# Patient Record
Sex: Female | Born: 1965 | Race: Black or African American | Hispanic: No | Smoking: Never smoker
Health system: Southern US, Community
[De-identification: ages and names within clinical notes are randomized; demographics above are authoritative.]

## PROBLEM LIST (undated history)

## (undated) DIAGNOSIS — F32A Depression, unspecified: Secondary | ICD-10-CM

## (undated) DIAGNOSIS — D219 Benign neoplasm of connective and other soft tissue, unspecified: Secondary | ICD-10-CM

## (undated) DIAGNOSIS — K682 Retroperitoneal fibrosis: Secondary | ICD-10-CM

## (undated) DIAGNOSIS — K219 Gastro-esophageal reflux disease without esophagitis: Secondary | ICD-10-CM

## (undated) DIAGNOSIS — D649 Anemia, unspecified: Secondary | ICD-10-CM

## (undated) DIAGNOSIS — E669 Obesity, unspecified: Secondary | ICD-10-CM

## (undated) DIAGNOSIS — N83209 Unspecified ovarian cyst, unspecified side: Secondary | ICD-10-CM

## (undated) DIAGNOSIS — N135 Crossing vessel and stricture of ureter without hydronephrosis: Secondary | ICD-10-CM

## (undated) DIAGNOSIS — R011 Cardiac murmur, unspecified: Secondary | ICD-10-CM

## (undated) DIAGNOSIS — H269 Unspecified cataract: Secondary | ICD-10-CM

## (undated) DIAGNOSIS — M1990 Unspecified osteoarthritis, unspecified site: Secondary | ICD-10-CM

## (undated) DIAGNOSIS — G4730 Sleep apnea, unspecified: Secondary | ICD-10-CM

## (undated) DIAGNOSIS — T7840XA Allergy, unspecified, initial encounter: Secondary | ICD-10-CM

## (undated) DIAGNOSIS — F419 Anxiety disorder, unspecified: Secondary | ICD-10-CM

## (undated) HISTORY — DX: Anxiety disorder, unspecified: F41.9

## (undated) HISTORY — DX: Anemia, unspecified: D64.9

## (undated) HISTORY — DX: Unspecified cataract: H26.9

## (undated) HISTORY — DX: Allergy, unspecified, initial encounter: T78.40XA

## (undated) HISTORY — PX: ABDOMINAL HYSTERECTOMY: SHX81

## (undated) HISTORY — PX: ABDOMINAL SURGERY: SHX537

## (undated) HISTORY — DX: Depression, unspecified: F32.A

## (undated) HISTORY — DX: Obesity, unspecified: E66.9

## (undated) HISTORY — DX: Gastro-esophageal reflux disease without esophagitis: K21.9

## (undated) HISTORY — DX: Sleep apnea, unspecified: G47.30

---

## 1999-12-27 ENCOUNTER — Emergency Department (HOSPITAL_COMMUNITY): Admission: EM | Admit: 1999-12-27 | Discharge: 1999-12-28 | Payer: Self-pay

## 2001-03-24 ENCOUNTER — Emergency Department (HOSPITAL_COMMUNITY): Admission: EM | Admit: 2001-03-24 | Discharge: 2001-03-24 | Payer: Self-pay | Admitting: Emergency Medicine

## 2001-11-10 ENCOUNTER — Emergency Department (HOSPITAL_COMMUNITY): Admission: EM | Admit: 2001-11-10 | Discharge: 2001-11-10 | Payer: Self-pay | Admitting: Emergency Medicine

## 2001-11-10 ENCOUNTER — Observation Stay (HOSPITAL_COMMUNITY): Admission: AD | Admit: 2001-11-10 | Discharge: 2001-11-11 | Payer: Self-pay | Admitting: Family Medicine

## 2002-10-06 ENCOUNTER — Emergency Department (HOSPITAL_COMMUNITY): Admission: EM | Admit: 2002-10-06 | Discharge: 2002-10-06 | Payer: Self-pay | Admitting: Emergency Medicine

## 2003-08-11 ENCOUNTER — Emergency Department (HOSPITAL_COMMUNITY): Admission: EM | Admit: 2003-08-11 | Discharge: 2003-08-11 | Payer: Self-pay | Admitting: Emergency Medicine

## 2003-12-11 ENCOUNTER — Emergency Department (HOSPITAL_COMMUNITY): Admission: EM | Admit: 2003-12-11 | Discharge: 2003-12-11 | Payer: Self-pay | Admitting: *Deleted

## 2004-08-11 ENCOUNTER — Emergency Department (HOSPITAL_COMMUNITY): Admission: EM | Admit: 2004-08-11 | Discharge: 2004-08-11 | Payer: Self-pay | Admitting: Emergency Medicine

## 2004-12-07 ENCOUNTER — Ambulatory Visit (HOSPITAL_COMMUNITY): Admission: RE | Admit: 2004-12-07 | Discharge: 2004-12-07 | Payer: Self-pay | Admitting: Obstetrics

## 2005-12-15 ENCOUNTER — Emergency Department (HOSPITAL_COMMUNITY): Admission: EM | Admit: 2005-12-15 | Discharge: 2005-12-15 | Payer: Self-pay | Admitting: Emergency Medicine

## 2010-09-23 ENCOUNTER — Emergency Department (HOSPITAL_COMMUNITY)
Admission: EM | Admit: 2010-09-23 | Discharge: 2010-09-23 | Disposition: A | Discharge status: Home / Self Care | Payer: Self-pay | Attending: Emergency Medicine | Admitting: Emergency Medicine

## 2010-09-23 DIAGNOSIS — K589 Irritable bowel syndrome without diarrhea: Secondary | ICD-10-CM | POA: Insufficient documentation

## 2010-09-23 DIAGNOSIS — K921 Melena: Secondary | ICD-10-CM | POA: Insufficient documentation

## 2010-09-23 DIAGNOSIS — E669 Obesity, unspecified: Secondary | ICD-10-CM | POA: Insufficient documentation

## 2010-09-23 DIAGNOSIS — Z7982 Long term (current) use of aspirin: Secondary | ICD-10-CM | POA: Insufficient documentation

## 2010-09-23 DIAGNOSIS — K922 Gastrointestinal hemorrhage, unspecified: Secondary | ICD-10-CM | POA: Insufficient documentation

## 2010-09-23 LAB — CBC
HCT: 32.7 % — ABNORMAL LOW (ref 36.0–46.0)
Hemoglobin: 10.7 g/dL — ABNORMAL LOW (ref 12.0–15.0)
MCH: 26.8 pg (ref 26.0–34.0)
MCHC: 32.7 g/dL (ref 30.0–36.0)
MCV: 82 fL (ref 78.0–100.0)
Platelets: 281 10*3/uL (ref 150–400)
RBC: 3.99 MIL/uL (ref 3.87–5.11)
RDW: 14.1 % (ref 11.5–15.5)
WBC: 4.7 10*3/uL (ref 4.0–10.5)

## 2010-09-23 LAB — POCT I-STAT, CHEM 8
BUN: 11 mg/dL (ref 6–23)
Calcium, Ion: 1.4 mmol/L — ABNORMAL HIGH (ref 1.12–1.32)
Chloride: 108 mEq/L (ref 96–112)
Creatinine, Ser: 1 mg/dL (ref 0.50–1.10)
Glucose, Bld: 97 mg/dL (ref 70–99)
HCT: 35 % — ABNORMAL LOW (ref 36.0–46.0)
Hemoglobin: 11.9 g/dL — ABNORMAL LOW (ref 12.0–15.0)
Potassium: 3.8 mEq/L (ref 3.5–5.1)
Sodium: 140 mEq/L (ref 135–145)
TCO2: 23 mmol/L (ref 0–100)

## 2010-09-23 LAB — TYPE AND SCREEN
ABO/RH(D): A POS
Antibody Screen: NEGATIVE

## 2010-09-23 LAB — ABO/RH: ABO/RH(D): A POS

## 2010-09-24 LAB — OCCULT BLOOD, POC DEVICE: Fecal Occult Bld: NEGATIVE

## 2010-09-26 ENCOUNTER — Encounter: Payer: Self-pay | Admitting: Internal Medicine

## 2010-09-30 ENCOUNTER — Emergency Department (HOSPITAL_COMMUNITY)
Admission: EM | Admit: 2010-09-30 | Discharge: 2010-09-30 | Disposition: A | Discharge status: Home / Self Care | Payer: Self-pay | Attending: Emergency Medicine | Admitting: Emergency Medicine

## 2010-09-30 DIAGNOSIS — D649 Anemia, unspecified: Secondary | ICD-10-CM | POA: Insufficient documentation

## 2010-09-30 DIAGNOSIS — M549 Dorsalgia, unspecified: Secondary | ICD-10-CM | POA: Insufficient documentation

## 2010-09-30 DIAGNOSIS — L989 Disorder of the skin and subcutaneous tissue, unspecified: Secondary | ICD-10-CM | POA: Insufficient documentation

## 2010-09-30 LAB — URINALYSIS, ROUTINE W REFLEX MICROSCOPIC
Bilirubin Urine: NEGATIVE
Glucose, UA: NEGATIVE mg/dL
Ketones, ur: NEGATIVE mg/dL
Leukocytes, UA: NEGATIVE
Nitrite: NEGATIVE
Protein, ur: 30 mg/dL — AB
Specific Gravity, Urine: 1.008 (ref 1.005–1.030)
Urobilinogen, UA: 0.2 mg/dL (ref 0.0–1.0)
pH: 7 (ref 5.0–8.0)

## 2010-09-30 LAB — URINE MICROSCOPIC-ADD ON

## 2010-10-07 ENCOUNTER — Emergency Department (HOSPITAL_COMMUNITY)
Admission: EM | Admit: 2010-10-07 | Discharge: 2010-10-07 | Disposition: A | Discharge status: Home / Self Care | Payer: Self-pay | Attending: Emergency Medicine | Admitting: Emergency Medicine

## 2010-10-07 DIAGNOSIS — G8929 Other chronic pain: Secondary | ICD-10-CM | POA: Insufficient documentation

## 2010-10-07 DIAGNOSIS — N898 Other specified noninflammatory disorders of vagina: Secondary | ICD-10-CM | POA: Insufficient documentation

## 2010-10-07 DIAGNOSIS — R51 Headache: Secondary | ICD-10-CM | POA: Insufficient documentation

## 2010-10-07 DIAGNOSIS — R110 Nausea: Secondary | ICD-10-CM | POA: Insufficient documentation

## 2010-10-07 DIAGNOSIS — R109 Unspecified abdominal pain: Secondary | ICD-10-CM | POA: Insufficient documentation

## 2010-10-10 ENCOUNTER — Inpatient Hospital Stay (INDEPENDENT_AMBULATORY_CARE_PROVIDER_SITE_OTHER)
Admission: RE | Admit: 2010-10-10 | Discharge: 2010-10-10 | Disposition: A | Discharge status: Home / Self Care | Payer: Self-pay | Source: Ambulatory Visit | Attending: Emergency Medicine | Admitting: Emergency Medicine

## 2010-10-10 DIAGNOSIS — R6889 Other general symptoms and signs: Secondary | ICD-10-CM

## 2010-10-18 ENCOUNTER — Ambulatory Visit (INDEPENDENT_AMBULATORY_CARE_PROVIDER_SITE_OTHER): Payer: Self-pay | Admitting: Internal Medicine

## 2010-10-18 ENCOUNTER — Encounter: Payer: Self-pay | Admitting: Internal Medicine

## 2010-10-18 DIAGNOSIS — D179 Benign lipomatous neoplasm, unspecified: Secondary | ICD-10-CM

## 2010-10-18 DIAGNOSIS — K219 Gastro-esophageal reflux disease without esophagitis: Secondary | ICD-10-CM

## 2010-10-18 MED ORDER — OMEPRAZOLE 20 MG PO CPDR: 20.0000 mg | DELAYED_RELEASE_CAPSULE | Freq: Every day | ORAL | Status: DC

## 2010-10-18 NOTE — Progress Notes (Signed)
Subjective:    Patient ID: Theresa Peterson, female    DOB: 12/24/1965, 45 y.o.   MRN: [id number]  HPI Theresa Peterson is a 45 year old African American female with a past medical history of GERD, obesity, anxiety, presenting for evaluation of recently recurring heartburn with nausea and for violation of a chronic left upper quadrant "lump".  Theresa Peterson reports that Theresa Peterson's had intermittent trouble with heartburn (several isolated episodes lasting days to a few weeks over the past few years) and recently over the past one to 2 months has had an "amplification of symptoms". Theresa Peterson GERD symptoms primarily persist of heartburn, water brash, epigastric and chest pressure, nausea and bloating. Theresa Peterson also reports at times Theresa Peterson feels headache and dizziness, which Theresa Peterson associates with Theresa Peterson GERD. Theresa Peterson reports symptoms are worse when Theresa Peterson eats a heavy meal. Theresa Peterson started omeprazole 20 mg approximately one week ago and has had excellent relief of all of these symptoms. Theresa Peterson does report some back pain which Theresa Peterson also believes is related to Theresa Peterson reflux. This is some better with the omeprazole but not completely. Theresa Peterson reports that this is located in Theresa Peterson left upper back and Theresa Peterson describes this as a "knot" the lying down seems to make this pain better and eating worsens the pain. Theresa Peterson occasionally feels a "lump in my throat" but denies dysphagia and odynophagia. Theresa Peterson weight has been stable and Theresa Peterson appetite is good. Theresa Peterson reports no trouble with bowel movements. Theresa Peterson stools are regular, formed and brown. Theresa Peterson's had no rectal bleeding or melena.  No fevers chills or night sweats.  Of note, Theresa Peterson reports a left upper quadrant lump which Theresa Peterson initially noticed in 2005. Theresa Peterson reports Theresa Peterson had this evaluated and was told it may be a "hiatal hernia". Theresa Peterson does not recall any imaging be done for this. This lump has been stable in size and has not changed recently.  Also of note, the patient reports Theresa Peterson had colonoscopy in 2011. This was done for left lower quadrant  abdominal pain. Theresa Peterson reports that the exam was positive for internal hemorrhoids but was otherwise unremarkable (referred by PCP in [place], Dr. Lynwood Dawley)   Review of Systems  Constitutional: Negative for fever, chills, activity change, appetite change and unexpected weight change.  HENT: Negative for congestion, sore throat, mouth sores and trouble swallowing.   Eyes: Negative for visual disturbance.  Respiratory: Negative for cough, chest tightness and shortness of breath.   Cardiovascular: Negative for chest pain, palpitations and leg swelling.  Gastrointestinal:       See HPI   Genitourinary: Negative for dysuria and hematuria.  Musculoskeletal: Positive for back pain. Negative for myalgias and arthralgias.  Skin: Negative for rash.  Neurological: Positive for dizziness and headaches. Negative for weakness and numbness.  Hematological: Does not bruise/bleed easily.  Psychiatric/Behavioral: Negative for dysphoric mood. The patient is not nervous/anxious.    Family History  Problem Relation Age of Onset  . Colon cancer Maternal Uncle   . Breast cancer Maternal Aunt   . Pancreatic cancer Maternal Grandmother   . Diabetes Maternal Uncle   . Heart disease Mother     Social History  . Marital Status: Single    Number of Children: 1 - 19 yo adult daughter, lives in [place]   Occupational History  . 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 Smoking status: Never Smoker   . Smokeless tobacco: Never Used  . Alcohol Use: Yes  2 a month   . Drug Use: No   Social History Narrative   2 caffeine drinks a week        Objective:   Physical Exam  Constitutional: Theresa Peterson is oriented to person, place, and time. Theresa Peterson appears well-developed and well-nourished. No distress.  HENT:  Head: Normocephalic and atraumatic.  Mouth/Throat: Oropharynx is clear and moist. No oropharyngeal exudate.  Eyes: Conjunctivae and EOM are normal. Pupils are equal,  round, and reactive to light. No scleral icterus.  Neck: Neck supple. No tracheal deviation present. No thyromegaly present.  Cardiovascular: Normal rate, regular rhythm and intact distal pulses.   Murmur heard. Pulmonary/Chest: Effort normal and breath sounds normal. Theresa Peterson has no wheezes. Theresa Peterson has no rales.  Abdominal: Soft. Bowel sounds are normal. Theresa Peterson exhibits no distension. There is no tenderness.       Palpable, soft, small, moveable subcutaneous mass in LUQ just inf to costal margin, query lipoma   Musculoskeletal: Theresa Peterson exhibits no edema and no tenderness.  Lymphadenopathy:    Theresa Peterson has no cervical adenopathy.  Neurological: Theresa Peterson is alert and oriented to person, place, and time.  Skin: Skin is warm and dry. No rash noted.  Psychiatric: Theresa Peterson has a normal mood and affect. Theresa Peterson behavior is normal.   BP 128/64  Pulse 80  Ht 5\' 4"  (1.626 m)  Wt 231 lb (104.781 kg)  BMI 39.65 kg/m2    Assessment & Plan:  This is a 45 year old female with a past medical history of GERD who presents with a recent exacerbation of Theresa Peterson reflux disease now very well controlled on omeprazole 20 mg daily. Theresa Peterson also is here to discuss a left upper quadrant "lump", which is most likely a lipoma.  1. GERD - the patient's reflux symptoms are very well controlled on once daily PPI. There are no other alarm symptoms present at this time, therefore I do not think that Theresa Peterson needs an endoscopy. Should Theresa Peterson symptoms change or alarm symptoms occur then we can perform an EGD at that time. We have discussed food and lifestyle modifications which can help reduce reflux. Theresa Peterson does eat a lot of peppermint and I have advised Theresa Peterson that this can lead to worsening reflux symptoms.  At this time Theresa Peterson back pain is most consistent with musculaoskeletal pain and not felt to be related to Theresa Peterson reflux disease.  For now I have advised that Theresa Peterson should continue omeprazole 20 mg daily, and I will see Theresa Peterson back in the office in 2 months for reassessment.  2. ?  Lipoma - the patient's left upper quadrant "lump" is most likely a lipoma. Theresa Peterson is seeking reassurance and therefore I will order a left upper quadrant ultrasound to better evaluate.

## 2010-10-18 NOTE — Patient Instructions (Addendum)
Prilosec samples given, take 1 tab daily 20-30 minutes before breakfast. You have been scheduled for a [place] at [facility] 10/22/10 815 am arrival time.  Nothing to eat or drink after midnight. Please follow up in 2-3 months

## 2010-10-22 ENCOUNTER — Ambulatory Visit (HOSPITAL_COMMUNITY)
Admission: RE | Admit: 2010-10-22 | Discharge: 2010-10-22 | Disposition: A | Discharge status: Home / Self Care | Payer: Self-pay | Source: Ambulatory Visit | Attending: Internal Medicine | Admitting: Internal Medicine

## 2010-10-22 ENCOUNTER — Other Ambulatory Visit: Payer: Self-pay | Admitting: Internal Medicine

## 2010-10-22 ENCOUNTER — Telehealth: Payer: Self-pay | Admitting: *Deleted

## 2010-10-22 DIAGNOSIS — K219 Gastro-esophageal reflux disease without esophagitis: Secondary | ICD-10-CM | POA: Insufficient documentation

## 2010-10-22 DIAGNOSIS — D1739 Benign lipomatous neoplasm of skin and subcutaneous tissue of other sites: Secondary | ICD-10-CM | POA: Insufficient documentation

## 2010-10-22 NOTE — Telephone Encounter (Signed)
Informed pt per Dr Rhea Belton, U/S shows that the small SQ mass is a Lipoma- benign- that needs no further f/u. She is scheduled for f/u on 12/17/10 at 1100am.

## 2010-10-22 NOTE — Telephone Encounter (Signed)
Message copied by Florene Glen on Mon Oct 22, 2010  1:26 PM ------      Message from: Beverley Fiedler      Created: Mon Oct 22, 2010 12:37 PM       Small subcutaneous mass felt on recent exam now known to be lipoma by [place].      Aram Beecham, please call patient and inform her of this result (benign lipoma which does not need further follow-up/evaluation).      I will followup with her in clinic as documented in last clinic note.      Thanks.

## 2010-10-26 ENCOUNTER — Encounter (HOSPITAL_COMMUNITY): Payer: Self-pay | Admitting: Radiology

## 2010-10-26 ENCOUNTER — Inpatient Hospital Stay (INDEPENDENT_AMBULATORY_CARE_PROVIDER_SITE_OTHER)
Admission: RE | Admit: 2010-10-26 | Discharge: 2010-10-26 | Disposition: A | Discharge status: Home / Self Care | Payer: Self-pay | Source: Ambulatory Visit | Attending: Family Medicine | Admitting: Family Medicine

## 2010-10-26 ENCOUNTER — Emergency Department (HOSPITAL_COMMUNITY): Payer: Self-pay

## 2010-10-26 ENCOUNTER — Emergency Department (HOSPITAL_COMMUNITY)
Admission: EM | Admit: 2010-10-26 | Discharge: 2010-10-26 | Disposition: A | Discharge status: Home / Self Care | Payer: Self-pay | Attending: Emergency Medicine | Admitting: Emergency Medicine

## 2010-10-26 DIAGNOSIS — R079 Chest pain, unspecified: Secondary | ICD-10-CM

## 2010-10-26 DIAGNOSIS — R0602 Shortness of breath: Secondary | ICD-10-CM | POA: Insufficient documentation

## 2010-10-26 DIAGNOSIS — R072 Precordial pain: Secondary | ICD-10-CM | POA: Insufficient documentation

## 2010-10-26 DIAGNOSIS — R011 Cardiac murmur, unspecified: Secondary | ICD-10-CM | POA: Insufficient documentation

## 2010-10-26 DIAGNOSIS — K219 Gastro-esophageal reflux disease without esophagitis: Secondary | ICD-10-CM | POA: Insufficient documentation

## 2010-10-26 DIAGNOSIS — R109 Unspecified abdominal pain: Secondary | ICD-10-CM | POA: Insufficient documentation

## 2010-10-26 HISTORY — DX: Benign neoplasm of connective and other soft tissue, unspecified: D21.9

## 2010-10-26 HISTORY — DX: Cardiac murmur, unspecified: R01.1

## 2010-10-26 LAB — DIFFERENTIAL
Basophils Absolute: 0 10*3/uL (ref 0.0–0.1)
Basophils Relative: 0 % (ref 0–1)
Eosinophils Absolute: 0.1 10*3/uL (ref 0.0–0.7)
Eosinophils Relative: 2 % (ref 0–5)
Lymphocytes Relative: 28 % (ref 12–46)
Lymphs Abs: 1.3 10*3/uL (ref 0.7–4.0)
Monocytes Absolute: 0.2 10*3/uL (ref 0.1–1.0)
Monocytes Relative: 4 % (ref 3–12)
Neutro Abs: 3.1 10*3/uL (ref 1.7–7.7)
Neutrophils Relative %: 66 % (ref 43–77)

## 2010-10-26 LAB — URINE MICROSCOPIC-ADD ON

## 2010-10-26 LAB — CBC
HCT: 28.3 % — ABNORMAL LOW (ref 36.0–46.0)
Hemoglobin: 9.2 g/dL — ABNORMAL LOW (ref 12.0–15.0)
MCH: 27.2 pg (ref 26.0–34.0)
MCHC: 32.5 g/dL (ref 30.0–36.0)
MCV: 83.7 fL (ref 78.0–100.0)
Platelets: 248 10*3/uL (ref 150–400)
RBC: 3.38 MIL/uL — ABNORMAL LOW (ref 3.87–5.11)
RDW: 15.1 % (ref 11.5–15.5)
WBC: 4.7 10*3/uL (ref 4.0–10.5)

## 2010-10-26 LAB — COMPREHENSIVE METABOLIC PANEL
ALT: 9 U/L (ref 0–35)
AST: 15 U/L (ref 0–37)
Albumin: 3.4 g/dL — ABNORMAL LOW (ref 3.5–5.2)
Alkaline Phosphatase: 56 U/L (ref 39–117)
BUN: 10 mg/dL (ref 6–23)
CO2: 29 mEq/L (ref 19–32)
Calcium: 10.6 mg/dL — ABNORMAL HIGH (ref 8.4–10.5)
Chloride: 106 mEq/L (ref 96–112)
Creatinine, Ser: 0.82 mg/dL (ref 0.50–1.10)
GFR calc Af Amer: >60 mL/min (ref >60)
GFR calc non Af Amer: >60 mL/min (ref >60)
Glucose, Bld: 92 mg/dL (ref 70–99)
Potassium: 4.4 mEq/L (ref 3.5–5.1)
Sodium: 138 mEq/L (ref 135–145)
Total Bilirubin: 0.2 mg/dL — ABNORMAL LOW (ref 0.3–1.2)
Total Protein: 6.7 g/dL (ref 6.0–8.3)

## 2010-10-26 LAB — URINALYSIS, ROUTINE W REFLEX MICROSCOPIC
Bilirubin Urine: NEGATIVE
Glucose, UA: NEGATIVE mg/dL
Ketones, ur: NEGATIVE mg/dL
Leukocytes, UA: NEGATIVE
Nitrite: NEGATIVE
Protein, ur: NEGATIVE mg/dL
Specific Gravity, Urine: 1.013 (ref 1.005–1.030)
Urobilinogen, UA: 0.2 mg/dL (ref 0.0–1.0)
pH: 7 (ref 5.0–8.0)

## 2010-10-26 LAB — POCT PREGNANCY, URINE: Preg Test, Ur: NEGATIVE

## 2010-10-26 LAB — POCT I-STAT TROPONIN I: Troponin i, poc: 0 ng/mL (ref 0.00–0.08)

## 2010-10-26 LAB — D-DIMER, QUANTITATIVE: D-Dimer, Quant: 1.51 ug/mL-FEU — ABNORMAL HIGH (ref 0.00–0.48)

## 2010-10-26 LAB — LIPASE, BLOOD: Lipase: 45 U/L (ref 11–59)

## 2010-10-26 MED ORDER — IOHEXOL 300 MG/ML  SOLN
100.0000 mL | Freq: Once | INTRAMUSCULAR | Status: AC | PRN
  Administered 2010-10-26: 100 mL via INTRAVENOUS

## 2010-10-29 ENCOUNTER — Emergency Department (HOSPITAL_COMMUNITY)
Admission: EM | Admit: 2010-10-29 | Discharge: 2010-10-29 | Disposition: A | Discharge status: Home / Self Care | Payer: Self-pay | Attending: Emergency Medicine | Admitting: Emergency Medicine

## 2010-10-29 DIAGNOSIS — K219 Gastro-esophageal reflux disease without esophagitis: Secondary | ICD-10-CM | POA: Insufficient documentation

## 2010-10-29 DIAGNOSIS — D179 Benign lipomatous neoplasm, unspecified: Secondary | ICD-10-CM | POA: Insufficient documentation

## 2010-10-29 DIAGNOSIS — R10816 Epigastric abdominal tenderness: Secondary | ICD-10-CM | POA: Insufficient documentation

## 2010-10-29 DIAGNOSIS — K2970 Gastritis, unspecified, without bleeding: Secondary | ICD-10-CM | POA: Insufficient documentation

## 2010-10-29 LAB — URINALYSIS, ROUTINE W REFLEX MICROSCOPIC
Bilirubin Urine: NEGATIVE
Glucose, UA: NEGATIVE mg/dL
Nitrite: NEGATIVE
Specific Gravity, Urine: 1.019 (ref 1.005–1.030)
pH: 6.5 (ref 5.0–8.0)

## 2010-10-29 LAB — URINE MICROSCOPIC-ADD ON

## 2010-10-29 LAB — POCT I-STAT, CHEM 8
BUN: 10 mg/dL (ref 6–23)
Chloride: 106 mEq/L (ref 96–112)
Creatinine, Ser: 1 mg/dL (ref 0.50–1.10)
Glucose, Bld: 97 mg/dL (ref 70–99)
HCT: 32 % — ABNORMAL LOW (ref 36.0–46.0)
Potassium: 4 mEq/L (ref 3.5–5.1)

## 2010-11-02 ENCOUNTER — Inpatient Hospital Stay (INDEPENDENT_AMBULATORY_CARE_PROVIDER_SITE_OTHER)
Admission: RE | Admit: 2010-11-02 | Discharge: 2010-11-02 | Disposition: A | Discharge status: Home / Self Care | Payer: Self-pay | Source: Ambulatory Visit | Attending: Family Medicine | Admitting: Family Medicine

## 2010-11-02 DIAGNOSIS — K219 Gastro-esophageal reflux disease without esophagitis: Secondary | ICD-10-CM

## 2010-11-19 ENCOUNTER — Ambulatory Visit: Payer: Self-pay | Admitting: Internal Medicine

## 2010-11-26 ENCOUNTER — Emergency Department (HOSPITAL_COMMUNITY): Payer: Self-pay

## 2010-11-26 ENCOUNTER — Emergency Department (HOSPITAL_COMMUNITY)
Admission: EM | Admit: 2010-11-26 | Discharge: 2010-11-26 | Disposition: A | Discharge status: Home / Self Care | Payer: Self-pay | Attending: Emergency Medicine | Admitting: Emergency Medicine

## 2010-11-26 DIAGNOSIS — K219 Gastro-esophageal reflux disease without esophagitis: Secondary | ICD-10-CM | POA: Insufficient documentation

## 2010-11-26 DIAGNOSIS — Z79899 Other long term (current) drug therapy: Secondary | ICD-10-CM | POA: Insufficient documentation

## 2010-11-26 DIAGNOSIS — R079 Chest pain, unspecified: Secondary | ICD-10-CM | POA: Insufficient documentation

## 2010-11-26 DIAGNOSIS — R002 Palpitations: Secondary | ICD-10-CM | POA: Insufficient documentation

## 2010-11-26 DIAGNOSIS — R000 Tachycardia, unspecified: Secondary | ICD-10-CM | POA: Insufficient documentation

## 2010-11-26 LAB — TROPONIN I: Troponin I: <0.3 ng/mL (ref <0.30)

## 2010-11-26 LAB — COMPREHENSIVE METABOLIC PANEL
ALT: 11 U/L (ref 0–35)
Albumin: 3.8 g/dL (ref 3.5–5.2)
Alkaline Phosphatase: 66 U/L (ref 39–117)
Potassium: 4.4 mEq/L (ref 3.5–5.1)
Sodium: 140 mEq/L (ref 135–145)
Total Protein: 7.3 g/dL (ref 6.0–8.3)

## 2010-11-26 LAB — CBC
Hemoglobin: 10.1 g/dL — ABNORMAL LOW (ref 12.0–15.0)
MCHC: 32.4 g/dL (ref 30.0–36.0)
RDW: 14.5 % (ref 11.5–15.5)

## 2010-11-26 LAB — DIFFERENTIAL
Basophils Absolute: 0 10*3/uL (ref 0.0–0.1)
Basophils Relative: 1 % (ref 0–1)
Eosinophils Relative: 2 % (ref 0–5)
Monocytes Absolute: 0.3 10*3/uL (ref 0.1–1.0)
Neutro Abs: 4.2 10*3/uL (ref 1.7–7.7)

## 2010-12-03 ENCOUNTER — Ambulatory Visit: Payer: Self-pay | Admitting: Internal Medicine

## 2010-12-07 ENCOUNTER — Encounter: Payer: Self-pay | Admitting: Cardiovascular Disease

## 2010-12-10 ENCOUNTER — Ambulatory Visit (INDEPENDENT_AMBULATORY_CARE_PROVIDER_SITE_OTHER): Payer: Self-pay | Admitting: Cardiovascular Disease

## 2010-12-10 ENCOUNTER — Encounter: Payer: Self-pay | Admitting: Cardiovascular Disease

## 2010-12-10 DIAGNOSIS — R002 Palpitations: Secondary | ICD-10-CM | POA: Insufficient documentation

## 2010-12-10 DIAGNOSIS — R079 Chest pain, unspecified: Secondary | ICD-10-CM | POA: Insufficient documentation

## 2010-12-10 NOTE — Patient Instructions (Signed)
Your physician has requested that you have an echocardiogram. Echocardiography is a painless test that uses sound waves to create images of your heart. It provides your doctor with information about the size and shape of your heart and how well your heart's chambers and valves are working. This procedure takes approximately one hour. There are no restrictions for this procedure.   Your physician has requested that you have an exercise tolerance test. For further information please visit [email or web address] Please also follow instruction sheet, as given.   Your physician has recommended that you wear a holter monitor. Holter monitors are medical devices that record the heart's electrical activity. Doctors most often use these monitors to diagnose arrhythmias. Arrhythmias are problems with the speed or rhythm of the heartbeat. The monitor is a small, portable device. You can wear one while you do your normal daily activities. This is usually used to diagnose what is causing palpitations/syncope (passing out).

## 2010-12-10 NOTE — Progress Notes (Signed)
History of Present Illness:44 yo AAF with history of anxiety/depression, anemia, GERD here today as a new cardiac patient. She was seen in the E D at [facility] 11/26/10 with c/o chest pain. She tells me that she has been having chest discomfort and palpitations since February 2012. She was started on Prilosec at that time. She continued to have discomfort in her chest and palpitations. Several days ago she felt her heart skip several beats. This happens once per week. She feels pressure in her chest every day. This is most notable in the early am when she wakes up. It seems to be worse with meals. The pain is never exertional. Occasional dizziness.   Past Medical History  Diagnosis Date  . Anemia   . Anxiety   . GERD (gastroesophageal reflux disease)   . Obesity   . Hemorrhoids   . Anemia   . Fibroids   . Heart murmur     No past surgical history on file.  Current Outpatient Prescriptions  Medication Sig Dispense Refill  . Ferrous Sulfate (IRON) 325 (65 FE) MG TABS Take 1 tablet by mouth daily.        . Multiple Vitamins-Minerals (MULTIVITAL) tablet Take 1 tablet by mouth daily.        Marland Kitchen omeprazole (PRILOSEC) 20 MG capsule Take 1 capsule (20 mg total) by mouth daily.  30 capsule  1    Allergies  Allergen Reactions  . Flagyl (Metronidazole Hcl)     History   Social History  . Marital Status: Divorced    Spouse Name: N/A    Number of Children: 1  . Years of Education: N/A   Occupational History  . Caregiver    Social History Main Topics  . Smoking status: Never Smoker   . Smokeless tobacco: Never Used  . Alcohol Use: Yes     2 a month   . Drug Use: No  . Sexually Active: Not on file   Other Topics Concern  . Not on file   Social History Narrative   2 caffeine drinks a week     Family History  Problem Relation Age of Onset  . Colon cancer Maternal Uncle   . Breast cancer Maternal Aunt   . Pancreatic cancer Maternal Grandmother   . Diabetes Maternal Uncle   . Heart  disease Mother     Review of Systems:  As stated in the HPI and otherwise negative.   BP 110/70  Pulse 76  Resp 18  Ht 5\' 4"  (1.626 m)  Wt 230 lb 1.9 oz (104.382 kg)  BMI 39.50 kg/m2  Physical Examination: General: Well developed, well nourished, NAD HEENT: OP clear, mucus membranes moist SKIN: warm, dry. No rashes. Neuro: No focal deficits Musculoskeletal: Muscle strength 5/5 all ext Psychiatric: Mood and affect normal Neck: No JVD, no carotid bruits, no thyromegaly, no lymphadenopathy. Lungs:Clear bilaterally, no wheezes, rhonci, crackles Cardiovascular: Regular rate and rhythm. No murmurs, gallops or rubs. Abdomen:Soft. Bowel sounds present. Non-tender.  Extremities: No lower extremity edema. Pulses are 2 + in the bilateral DP/PT.  EKG:NSR, rate 76 bpm. Normal EKG.

## 2010-12-10 NOTE — Assessment & Plan Note (Signed)
Likely premature beats. No prolonged episodes. Will arrange 48 hour Holter monitor. Will get echo to exclude structural heart disease.

## 2010-12-10 NOTE — Assessment & Plan Note (Signed)
Atypical. Will arrange exercise treadmill stress test.

## 2010-12-17 ENCOUNTER — Ambulatory Visit: Payer: Self-pay | Admitting: Internal Medicine

## 2010-12-24 ENCOUNTER — Encounter (INDEPENDENT_AMBULATORY_CARE_PROVIDER_SITE_OTHER): Payer: Self-pay

## 2010-12-24 DIAGNOSIS — R002 Palpitations: Secondary | ICD-10-CM

## 2010-12-26 ENCOUNTER — Ambulatory Visit (INDEPENDENT_AMBULATORY_CARE_PROVIDER_SITE_OTHER): Payer: Self-pay | Admitting: Cardiovascular Disease

## 2010-12-26 ENCOUNTER — Ambulatory Visit (HOSPITAL_COMMUNITY): Payer: Self-pay | Attending: Cardiology | Admitting: Radiology

## 2010-12-26 DIAGNOSIS — R002 Palpitations: Secondary | ICD-10-CM | POA: Insufficient documentation

## 2010-12-26 DIAGNOSIS — I319 Disease of pericardium, unspecified: Secondary | ICD-10-CM | POA: Insufficient documentation

## 2010-12-26 DIAGNOSIS — E669 Obesity, unspecified: Secondary | ICD-10-CM | POA: Insufficient documentation

## 2010-12-26 DIAGNOSIS — R079 Chest pain, unspecified: Secondary | ICD-10-CM

## 2010-12-26 DIAGNOSIS — R072 Precordial pain: Secondary | ICD-10-CM | POA: Insufficient documentation

## 2010-12-26 NOTE — Progress Notes (Signed)
Exercise Treadmill Test  Pre-Exercise Testing Evaluation Rhythm: normal sinus  Rate: 63   PR:  .18 QRS:  .08  QT:  .39 QTc: .40     Test  Exercise Tolerance Test Ordering MD: Melene Muller, MD  Interpreting MD:  Melene Muller, MD  Unique Test No: 1  Treadmill:  1  Indication for ETT: chest pain - rule out ischemia  Contraindication to ETT: No   Stress Modality: exercise - treadmill  Cardiac Imaging Performed: non   Protocol: standard Bruce - maximal  Max BP:  156/68  Max MPHR (bpm):  176 85% MPR (bpm):  149  MPHR obtained (bpm):  171 % MPHR obtained:  97%  Reached 85% MPHR (min:sec):  2:00 Total Exercise Time (min-sec):  5:02  Workload in METS:  7.0 Borg Scale: 17  Reason ETT Terminated:  fatigue    ST Segment Analysis At Rest: normal ST segments - no evidence of significant ST depression With Exercise: no evidence of significant ST depression  Other Information Arrhythmia:  No Angina during ETT:  absent (0) Quality of ETT:  non-diagnostic  ETT Interpretation:  normal - no evidence of ischemia by ST analysis  Comments:  Pt exercised for 5 minutes of the Bruce protocol and stopped secondary to fatigue. There were no ischemic EKG changes with exercise. No chest pain with exercise.   Recommendations: No further ischemic cardiac workup.

## 2010-12-29 ENCOUNTER — Inpatient Hospital Stay (INDEPENDENT_AMBULATORY_CARE_PROVIDER_SITE_OTHER)
Admission: RE | Admit: 2010-12-29 | Discharge: 2010-12-29 | Disposition: A | Discharge status: Home / Self Care | Payer: Self-pay | Source: Ambulatory Visit | Attending: Family Medicine | Admitting: Family Medicine

## 2010-12-29 DIAGNOSIS — H5710 Ocular pain, unspecified eye: Secondary | ICD-10-CM

## 2011-01-22 ENCOUNTER — Telehealth: Payer: Self-pay | Admitting: *Deleted

## 2011-01-22 NOTE — Telephone Encounter (Signed)
Called pt to review monitor results. Left message to call back

## 2011-01-22 NOTE — Telephone Encounter (Signed)
Fu call °Pt returning your call  °

## 2011-01-22 NOTE — Telephone Encounter (Signed)
Spoke with pt and reviewed monitor results with her.  

## 2011-01-31 ENCOUNTER — Encounter (HOSPITAL_COMMUNITY): Payer: Self-pay | Admitting: Adult Health

## 2011-01-31 ENCOUNTER — Emergency Department (HOSPITAL_COMMUNITY): Payer: Self-pay

## 2011-01-31 ENCOUNTER — Emergency Department (HOSPITAL_COMMUNITY)
Admission: EM | Admit: 2011-01-31 | Discharge: 2011-01-31 | Disposition: A | Discharge status: Home / Self Care | Payer: Self-pay | Attending: Emergency Medicine | Admitting: Emergency Medicine

## 2011-01-31 DIAGNOSIS — R51 Headache: Secondary | ICD-10-CM | POA: Insufficient documentation

## 2011-01-31 DIAGNOSIS — H5710 Ocular pain, unspecified eye: Secondary | ICD-10-CM | POA: Insufficient documentation

## 2011-01-31 DIAGNOSIS — M79602 Pain in left arm: Secondary | ICD-10-CM

## 2011-01-31 DIAGNOSIS — K219 Gastro-esophageal reflux disease without esophagitis: Secondary | ICD-10-CM | POA: Insufficient documentation

## 2011-01-31 DIAGNOSIS — E669 Obesity, unspecified: Secondary | ICD-10-CM | POA: Insufficient documentation

## 2011-01-31 DIAGNOSIS — M25529 Pain in unspecified elbow: Secondary | ICD-10-CM | POA: Insufficient documentation

## 2011-01-31 DIAGNOSIS — M79609 Pain in unspecified limb: Secondary | ICD-10-CM | POA: Insufficient documentation

## 2011-01-31 DIAGNOSIS — F411 Generalized anxiety disorder: Secondary | ICD-10-CM | POA: Insufficient documentation

## 2011-01-31 LAB — CBC
HCT: 36.7 % (ref 36.0–46.0)
Hemoglobin: 11.4 g/dL — ABNORMAL LOW (ref 12.0–15.0)
WBC: 5.1 10*3/uL (ref 4.0–10.5)

## 2011-01-31 LAB — DIFFERENTIAL
Basophils Absolute: 0 10*3/uL (ref 0.0–0.1)
Lymphocytes Relative: 27 % (ref 12–46)
Monocytes Absolute: 0.1 10*3/uL (ref 0.1–1.0)
Monocytes Relative: 2 % — ABNORMAL LOW (ref 3–12)
Neutro Abs: 3.4 10*3/uL (ref 1.7–7.7)

## 2011-01-31 LAB — BASIC METABOLIC PANEL
CO2: 27 mEq/L (ref 19–32)
Chloride: 102 mEq/L (ref 96–112)
Creatinine, Ser: 0.84 mg/dL (ref 0.50–1.10)

## 2011-01-31 MED ORDER — DICLOFENAC SODIUM 75 MG PO TBEC: 75.0000 mg | DELAYED_RELEASE_TABLET | Freq: Two times a day (BID) | ORAL | Status: DC

## 2011-01-31 NOTE — ED Notes (Signed)
C/o blurred vision in the right eye with a heavy sensation of the eyelid---No visible deformity---Also pain in the left elbow extending down to left hand and involving the thumb, 2nd and 3rd digits.  Rates the pain an 8 on 1-10 scale.

## 2011-01-31 NOTE — ED Provider Notes (Signed)
History     CSN: [id number] Arrival date & time: 01/31/2011  9:10 AM   First MD Initiated Contact with Patient 01/31/11 1008     10:41 AM HPI Patient complaining of left arm pain. Points to pain located in her left elbow and radiating down her entire forearm to her first second and third phalanges. Describes pain as aching pain. Reports pain is worse when lifting heavy objects. States she cannot lift her milk carton with her left hand. Reports will become weak and she'll drop it. States at times the pain radiates all the way up her left shoulder. Denies known injury. 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 States at first pain was intermittent but now is constant. Reports pain initially was helped by taking Advil. Patient also complaining of right eye pain. Describes the sensation as burning. States her eyelid also feels swollen and her vision is blurred. Reports similar symptoms in her left eye several months ago. States at that time she had seen her ophthalmologist because she thought her vision had changed. Denies she had any visual changes. States symptoms gradually disappeared. Current right eye pain has been ongoing for one week. States pain will relieve itself after taking Advil. Reports she became concerned when she began to have a headache on the right side behind her eye. Describes headache is mild and aching. Associated with nausea. Denies any associated symptoms such as a fascia, ataxia, neck pain, fever, rash. Patient is a 45 y.o. female presenting with eye problem, arm pain, and headaches. The history is provided by the patient.  Eye Problem  This is a new problem. The current episode started more than 1 week ago. Episode frequency: intermittently  The problem has not changed since onset.There is pain in the right eye. There was no injury mechanism. The pain is moderate. There is no history of trauma to the eye. There is no known exposure to pink eye. She  does not wear contacts. Associated symptoms include blurred vision and nausea. Pertinent negatives include no numbness, no discharge, no double vision, no photophobia, no eye redness, no vomiting, no tingling and no itching. She has tried nothing for the symptoms.  Arm Pain This is a new problem. Episode onset: a month. The problem occurs constantly. The problem has been gradually worsening. Associated symptoms include headaches and nausea. Pertinent negatives include no abdominal pain, chest pain, chills, congestion, coughing, fever, joint swelling, neck pain, numbness, rash, sore throat or vomiting. Exacerbated by: lifting heavy objects. She has tried NSAIDs for the symptoms. The treatment provided mild relief.  Headache  This is a new problem. The current episode started more than 1 week ago. Episode frequency: intermittently. The problem has not changed since onset.Associated with: eye pain. The pain is located in the frontal (Right frontal: behind eye) region. The quality of the pain is described as dull. The pain is mild. The pain does not radiate. Associated symptoms include nausea. Pertinent negatives include no fever, no near-syncope, no palpitations, no syncope, no shortness of breath and no vomiting. She has tried NSAIDs for the symptoms. The treatment provided mild relief.    Past Medical History  Diagnosis Date  . Anemia   . Anxiety   . GERD (gastroesophageal reflux disease)   . Obesity   . Hemorrhoids   . Anemia   . Fibroids   . Heart murmur     History reviewed. No pertinent past surgical history.  Family History  Problem Relation Age of Onset  . Colon cancer Maternal Uncle   . Breast cancer Maternal Aunt   . Heart disease Maternal Aunt   . Pancreatic cancer Maternal Grandmother   . Diabetes Maternal Uncle   . Hypertension Mother     History  Substance Use Topics  . Smoking status: Never Smoker   . Smokeless tobacco: Never Used  . Alcohol Use: Yes     2 a month      OB History    Grav Para Term Preterm Abortions TAB SAB Ect Mult Living                  Review of Systems  Constitutional: Negative for fever and chills.  HENT: Negative for ear pain, congestion, sore throat, neck pain and neck stiffness.   Eyes: Positive for blurred vision, pain and visual disturbance. Negative for double vision, photophobia, discharge, redness and itching.  Respiratory: Negative for cough and shortness of breath.   Cardiovascular: Negative for chest pain, palpitations, syncope and near-syncope.  Gastrointestinal: Positive for nausea. Negative for vomiting and abdominal pain.  Musculoskeletal: Negative for back pain and joint swelling.  Skin: Negative for itching and rash.  Neurological: Positive for headaches. Negative for dizziness, tingling, syncope, speech difficulty and numbness.  All other systems reviewed and are negative.    Allergies  Food and Flagyl  Home Medications   Current Outpatient Rx  Name Route Sig Dispense Refill  . ASPIRIN 325 MG PO TBEC Oral Take 650 mg by mouth every 4 (four) hours as needed. For arm pain     . IRON 325 (65 FE) MG PO TABS Oral Take 1 tablet by mouth daily.      . IBUPROFEN 200 MG PO TABS Oral Take 200 mg by mouth every 6 (six) hours as needed. For arm pain     . METRONIDAZOLE 250 MG PO TABS Oral Take 500 mg by mouth 3 (three) times daily. Patient only took a couple of tablets and started feeling sick so she stopped.     . MULTIVITAL PO TABS Oral Take 1 tablet by mouth daily.      Marland Kitchen OMEPRAZOLE 20 MG PO CPDR Oral Take 1 capsule (20 mg total) by mouth daily. 30 capsule 1    BP 127/83  Pulse 86  Temp(Src) 98.6 F (37 C) (Oral)  Resp 20  SpO2 100%  Physical Exam  Vitals reviewed. Constitutional: She is oriented to person, place, and time. Vital signs are normal. She appears well-developed and well-nourished.  HENT:  Head: Normocephalic and atraumatic.  Right Ear: Tympanic membrane, external ear and ear canal  normal.  Left Ear: Tympanic membrane, external ear and ear canal normal.  Eyes: Conjunctivae, EOM and lids are normal. Pupils are equal, round, and reactive to light.  Neck: Trachea normal and normal range of motion. Neck supple. No spinous process tenderness and no muscular tenderness present. No Brudzinski's sign and no Kernig's sign noted. No mass present.  Cardiovascular: Normal rate, regular rhythm and normal heart sounds.  Exam reveals no friction rub.   No murmur heard. Pulmonary/Chest: Effort normal and breath sounds normal. She has no wheezes. She has no rhonchi. She has no rales. She exhibits no tenderness.  Musculoskeletal: Normal range of motion.       Left neck: Full range of motion. Nontender to palpation or percussion.  Left shoulder: full range of motion. Normal strength. No obvious deformities. Nontender to palpation or percussion.  Left elbow: Full range  of motion. Normal strength. No obvious deformities. No tenderness to palpation. No reproducible symptoms with percussion.  Left wrist: Full range of motion. Normal strength. No obvious deformities. No tenderness to palpation. No reproducible symptoms with percussion.  Negative Tinel's and Phalen's test. Negative drop test.   Neurological: She is alert and oriented to person, place, and time. She has normal strength. No cranial nerve deficit or sensory deficit. Coordination normal. GCS eye subscore is 4. GCS verbal subscore is 5. GCS motor subscore is 6.  Skin: Skin is warm and dry. No rash noted. No erythema. No pallor.    ED Course  Procedures    MDM    2:33 PM  Labs and imaging within Normal limits. Vision is 20/13, corrected in both eyes and separately. Advised follow-up with her ophthalmologist and a PCP for further evaluation. Advised return to ed for worsening symptoms. Given referrals for PCP f/u. Rx for Diclofenac if pain persists     Thomasene Lot, [place] 01/31/11 1648

## 2011-01-31 NOTE — ED Notes (Signed)
Pt states she began having vision changes in both eyes c/o blurred vision and slight headache one week and a half ago, did see optometrist who stated everything was fine but did have a smaller right pupil than left. Today she says her right eye feels swollen and vision is still blurred. C/o left arm pain that began one month ago after chest pain that was severe, the left arm pain has gotten worse and pt staes she can not pick up a gallon of milk w/o severe pain.

## 2011-01-31 NOTE — ED Provider Notes (Signed)
Medical screening examination/treatment/procedure(s) were performed by non-physician practitioner and as supervising physician I was immediately available for consultation/collaboration.  Flint Melter, MD 01/31/11 2107

## 2011-02-08 ENCOUNTER — Emergency Department (HOSPITAL_COMMUNITY)
Admission: EM | Admit: 2011-02-08 | Discharge: 2011-02-08 | Disposition: A | Discharge status: Home / Self Care | Payer: Self-pay | Attending: Emergency Medicine | Admitting: Emergency Medicine

## 2011-02-08 ENCOUNTER — Encounter (HOSPITAL_COMMUNITY): Payer: Self-pay | Admitting: *Deleted

## 2011-02-08 DIAGNOSIS — R202 Paresthesia of skin: Secondary | ICD-10-CM

## 2011-02-08 DIAGNOSIS — R209 Unspecified disturbances of skin sensation: Secondary | ICD-10-CM | POA: Insufficient documentation

## 2011-02-08 DIAGNOSIS — E669 Obesity, unspecified: Secondary | ICD-10-CM | POA: Insufficient documentation

## 2011-02-08 DIAGNOSIS — R011 Cardiac murmur, unspecified: Secondary | ICD-10-CM | POA: Insufficient documentation

## 2011-02-08 DIAGNOSIS — M79609 Pain in unspecified limb: Secondary | ICD-10-CM | POA: Insufficient documentation

## 2011-02-08 DIAGNOSIS — K219 Gastro-esophageal reflux disease without esophagitis: Secondary | ICD-10-CM | POA: Insufficient documentation

## 2011-02-08 NOTE — ED Notes (Signed)
Complaining of pain in her left calf. Started as burning sensation and has progressed to pain and numbness/tingling in calf and foot. Rates pain as 5/10. Took aspirin last night at 2100. States she has burning sensation again.

## 2011-02-08 NOTE — ED Notes (Signed)
Patient reports onset of burning sensation in her left calf.  She states she has since noted onset of tingling in her foot and toes on the left.  Patient denies long trips.  Patient denies hx of dvt.

## 2011-02-08 NOTE — ED Provider Notes (Signed)
History     CSN: [id number] Arrival date & time: 02/08/2011 10:53 AM   First MD Initiated Contact with Patient 02/08/11 1149      Chief Complaint  Patient presents with  . Leg Pain    (Consider location/radiation/quality/duration/timing/severity/associated sxs/prior treatment) Patient is a 45 y.o. female presenting with leg pain. The history is provided by the patient.  Leg Pain  The incident occurred 2 days ago. The incident occurred at home. There was no injury mechanism. The pain is present in the left leg. The quality of the pain is described as tingling. The patient is experiencing no pain. Associated symptoms include tingling. She reports no foreign bodies present. The symptoms are aggravated by nothing. She has tried nothing for the symptoms.    Past Medical History  Diagnosis Date  . Anemia   . Anxiety   . GERD (gastroesophageal reflux disease)   . Obesity   . Hemorrhoids   . Anemia   . Fibroids   . Heart murmur     History reviewed. No pertinent past surgical history.  Family History  Problem Relation Age of Onset  . Colon cancer Maternal Uncle   . Breast cancer Maternal Aunt   . Heart disease Maternal Aunt   . Pancreatic cancer Maternal Grandmother   . Diabetes Maternal Uncle   . Hypertension Mother     History  Substance Use Topics  . Smoking status: Never Smoker   . Smokeless tobacco: Never Used  . Alcohol Use: Yes     2 a month     OB History    Grav Para Term Preterm Abortions TAB SAB Ect Mult Living                  Review of Systems  Neurological: Positive for tingling.  All other systems reviewed and are negative.    Allergies  Food and Flagyl  Home Medications   Current Outpatient Rx  Name Route Sig Dispense Refill  . ASPIRIN 325 MG PO TBEC Oral Take 650 mg by mouth every 4 (four) hours as needed. For arm pain     . IRON 325 (65 FE) MG PO TABS Oral Take 1 tablet by mouth daily.      . IBUPROFEN 200 MG PO TABS Oral Take 200 mg  by mouth every 6 (six) hours as needed. For arm pain     . MULTIVITAL PO TABS Oral Take 1 tablet by mouth daily.        BP 115/77  Pulse 74  Temp(Src) 98.3 F (36.8 C) (Oral)  Resp 16  Ht 5\' 4"  (1.626 m)  Wt 230 lb (104.327 kg)  BMI 39.48 kg/m2  SpO2 98%  Physical Exam  Nursing note and vitals reviewed. Constitutional: She is oriented to person, place, and time. She appears well-developed and well-nourished.  HENT:  Head: Normocephalic.  Eyes: Pupils are equal, round, and reactive to light.  Neck: Normal range of motion. Neck supple.  Cardiovascular: Normal rate and regular rhythm.   Pulmonary/Chest: Effort normal and breath sounds normal.  Abdominal: Soft. Bowel sounds are normal.  Musculoskeletal: Normal range of motion.  Neurological: She is alert and oriented to person, place, and time.  Skin: Skin is warm and dry.  Psychiatric: She has a normal mood and affect.   No calf pain. ED Course  Procedures (including critical care time)  Labs Reviewed - No data to display No results found.   No diagnosis found.    MDM  Jimmye Norman, NP 02/09/11 [phone]

## 2011-02-11 NOTE — ED Provider Notes (Signed)
Medical screening examination/treatment/procedure(s) were performed by non-physician practitioner and as supervising physician I was immediately available for consultation/collaboration.   Munachimso Palin, MD 02/11/11 0749 

## 2011-02-15 ENCOUNTER — Other Ambulatory Visit: Payer: Self-pay

## 2011-02-15 ENCOUNTER — Emergency Department (HOSPITAL_COMMUNITY): Payer: Self-pay

## 2011-02-15 ENCOUNTER — Encounter (HOSPITAL_COMMUNITY): Payer: Self-pay | Admitting: *Deleted

## 2011-02-15 ENCOUNTER — Emergency Department (HOSPITAL_COMMUNITY)
Admission: EM | Admit: 2011-02-15 | Discharge: 2011-02-15 | Disposition: A | Discharge status: Home / Self Care | Payer: Self-pay | Attending: Emergency Medicine | Admitting: Emergency Medicine

## 2011-02-15 DIAGNOSIS — R079 Chest pain, unspecified: Secondary | ICD-10-CM | POA: Insufficient documentation

## 2011-02-15 DIAGNOSIS — R070 Pain in throat: Secondary | ICD-10-CM | POA: Insufficient documentation

## 2011-02-15 LAB — BASIC METABOLIC PANEL
CO2: 27 mEq/L (ref 19–32)
Calcium: 10.9 mg/dL — ABNORMAL HIGH (ref 8.4–10.5)
Creatinine, Ser: 0.91 mg/dL (ref 0.50–1.10)
GFR calc Af Amer: 88 mL/min — ABNORMAL LOW (ref >90)
GFR calc non Af Amer: 76 mL/min — ABNORMAL LOW (ref >90)
Sodium: 137 mEq/L (ref 135–145)

## 2011-02-15 LAB — POCT I-STAT TROPONIN I: Troponin i, poc: 0 ng/mL (ref 0.00–0.08)

## 2011-02-15 LAB — CBC
MCH: 27.1 pg (ref 26.0–34.0)
MCHC: 32.2 g/dL (ref 30.0–36.0)
Platelets: 280 10*3/uL (ref 150–400)
RBC: 3.4 MIL/uL — ABNORMAL LOW (ref 3.87–5.11)
RDW: 13.9 % (ref 11.5–15.5)

## 2011-02-15 LAB — D-DIMER, QUANTITATIVE: D-Dimer, Quant: 2.33 ug/mL-FEU — ABNORMAL HIGH (ref 0.00–0.48)

## 2011-02-15 MED ORDER — MORPHINE SULFATE 4 MG/ML IJ SOLN: 4.0000 mg | Freq: Once | INTRAMUSCULAR | Status: DC

## 2011-02-15 MED ORDER — IOHEXOL 300 MG/ML  SOLN
100.0000 mL | Freq: Once | INTRAMUSCULAR | Status: AC | PRN
  Administered 2011-02-15: 100 mL via INTRAVENOUS

## 2011-02-15 MED ORDER — ONDANSETRON HCL 4 MG/2ML IJ SOLN
4.0000 mg | Freq: Once | INTRAMUSCULAR | Status: AC
  Administered 2011-02-15: 4 mg via INTRAVENOUS
  Filled 2011-02-15: qty 2

## 2011-02-15 MED ORDER — MORPHINE SULFATE 2 MG/ML IJ SOLN
INTRAMUSCULAR | Status: AC
  Administered 2011-02-15: 4 mg via INTRAVENOUS
  Filled 2011-02-15: qty 2

## 2011-02-15 MED ORDER — ASPIRIN 325 MG PO TABS
325.0000 mg | ORAL_TABLET | Freq: Once | ORAL | Status: DC
  Filled 2011-02-15: qty 1

## 2011-02-15 MED ORDER — ASPIRIN 81 MG PO CHEW
CHEWABLE_TABLET | ORAL | Status: AC
  Administered 2011-02-15: 324 mg
  Filled 2011-02-15: qty 4

## 2011-02-15 NOTE — ED Provider Notes (Signed)
History     CSN: [id number] Arrival date & time: 02/15/2011  9:39 AM   First MD Initiated Contact with Patient 02/15/11 1020      Chief Complaint  Patient presents with  . Chest Pain    (Consider location/radiation/quality/duration/timing/severity/associated sxs/prior treatment) HPI Patient presenting with complaint of left upper chest pain which also radiates into her left shoulder and left neck. She states the pain has been constant since yesterday approximately 1 PM. She's had similar pain in the past and actually was seen by cardiology and completed negative stress test on 12/26/2010. She denies any shortness of breath. She does say that palpation in one spot on her anterior chest wall does make pain worse. She also complains of a sore throat but has had no fevers chills no difficulty swallowing or breathing. She's not had any leg swelling. Her pain is not pleuritic in nature. She's had no cough. She's had no recent travel no history of DVT or PE no history of surgery her recent traumas  Past Medical History  Diagnosis Date  . Anemia   . Anxiety   . GERD (gastroesophageal reflux disease)   . Obesity   . Hemorrhoids   . Anemia   . Fibroids   . Heart murmur     History reviewed. No pertinent past surgical history.  Family History  Problem Relation Age of Onset  . Colon cancer Maternal Uncle   . Breast cancer Maternal Aunt   . Heart disease Maternal Aunt   . Pancreatic cancer Maternal Grandmother   . Diabetes Maternal Uncle   . Hypertension Mother     History  Substance Use Topics  . Smoking status: Never Smoker   . Smokeless tobacco: Never Used  . Alcohol Use: Yes     2 a month     OB History    Grav Para Term Preterm Abortions TAB SAB Ect Mult Living                  Review of Systems ROS reviewed and otherwise negative except for mentioned in HPI  Allergies  Food and Flagyl  Home Medications   Current Outpatient Rx  Name Route Sig Dispense Refill    . ASPIRIN 325 MG PO TBEC Oral Take 650 mg by mouth every 4 (four) hours as needed. For arm pain     . CYCLOBENZAPRINE HCL 5 MG PO TABS Oral Take 5 mg by mouth 3 (three) times daily as needed. spasms     . IRON 325 (65 FE) MG PO TABS Oral Take 1 tablet by mouth daily.      . IBUPROFEN 200 MG PO TABS Oral Take 200 mg by mouth every 6 (six) hours as needed. For arm pain     . MULTIVITAL PO TABS Oral Take 1 tablet by mouth daily.        BP 107/64  Pulse 55  Temp(Src) 98.3 F (36.8 C) (Oral)  Resp 17  Wt 230 lb (104.327 kg)  SpO2 100%  LMP 02/11/2011 Vitals reviewed Physical Exam Physical Examination: General appearance - alert, well appearing, and in no distress Mental status - alert, oriented to person, place, and time Mouth - mucous membranes moist, pharynx normal without lesions Chest - clear to auscultation, no wheezes, rales or rhonchi, symmetric air entry Heart - normal rate, regular rhythm, normal S1, S2, no murmurs, rubs, clicks or gallops Abdomen - soft, nontender, nondistended, no masses or organomegaly Musculoskeletal - no joint tenderness, deformity or swelling  Extremities - peripheral pulses normal, no pedal edema, no clubbing or cyanosis Skin - normal coloration and turgor, no rashes  ED Course  Procedures (including critical care time)  Date: 02/15/2011  Rate: 82  Rhythm: normal sinus rhythm  QRS Axis: normal  Intervals: normal  ST/T Wave abnormalities: t wave inversions in lead III, v3, no ST changes  Conduction Disutrbances:none  Narrative Interpretation:   Old EKG Reviewed: unchanged compared to ekg of 11/26/10    Labs Reviewed  CBC - Abnormal; Notable for the following:    RBC 3.40 (*)    Hemoglobin 9.2 (*)    HCT 28.6 (*)    All other components within normal limits  BASIC METABOLIC PANEL - Abnormal; Notable for the following:    Calcium 10.9 (*)    GFR calc non Af Amer 76 (*)    GFR calc Af Amer 88 (*)    All other components within normal limits   D-DIMER, QUANTITATIVE - Abnormal; Notable for the following:    D-Dimer, Quant 2.33 (*)    All other components within normal limits  POCT I-STAT TROPONIN I  I-STAT TROPONIN I   Dg Chest 2 View  02/15/2011  *RADIOLOGY REPORT*  Clinical Data: Left chest pain.  CHEST - 2 VIEW  Comparison: 11/26/2010  Findings: Two views of the chest demonstrate clear lungs. Heart and mediastinum are within normal limits.  The trachea is midline. Bony structures are intact.  IMPRESSION: No acute chest findings.  Original Report Authenticated By: Richarda Overlie, M.D.   Ct Angio Chest W/cm &/or Wo Cm  02/15/2011  *RADIOLOGY REPORT*  Clinical Data:  Chest pain and elevated D-dimer  CT ANGIOGRAPHY CHEST WITH CONTRAST  Technique:  Multidetector CT imaging of the chest was performed using the standard protocol during bolus administration of intravenous contrast.  Multiplanar CT image reconstructions including MIPs were obtained to evaluate the vascular anatomy.  Contrast: OMNIPAQUE IOHEXOL 300 MG/ML IV SOLN  Comparison:  October 26, 2010  Findings:  There are no filling defects within either pulmonary arterial system to suggest a segmental or subsegmental pulmonary embolus. The thoracic aorta has a normal caliber and appearance. There is no axillary, hilar, mediastinal adenopathy.  The visualized upper abdomen and osseous structures are unremarkable. Very small bilateral pleural effusions are present.  There are no focal infiltrates.  Two tiny 4 mm right upper lobe nodules are noted. If the patient is at high risk for bronchogenic carcinoma, follow-up chest CT at 1 year is recommended.  If the patient is at low risk, no follow-up is needed.  This recommendation follows the consensus statement: Guidelines for Management of Small Pulmonary Nodules Detected on CT Scans:  A Statement from the Fleischner Society as published in Radiology 2005; 237:395-400.  Available online at:   [email or web address].cz.  Review of the MIP images confirms the above findings.  IMPRESSION: There is no evidence of pulmonary embolus or other acute abnormality.  Small bilateral pleural effusions.  Original Report Authenticated By: Brandon Melnick, M.D.     1. Chest pain       MDM  Patient presents with complaint of chest pain. She had an unremarkable stress test approximately one month ago. She states the pain has been constant since yesterday afternoon. Her workup today includes an EKG which shows no changes compared to her prior EKG. Her troponin is 0.0 and due to having constant pain this would rule her out for any significant ACS or ischemia in addition to the normal  stress test recently obtained. Her d-dimer was elevated so she underwent chest CT which showed no pulmonary embolism. Patient was discharged with strict return precautions and she is agreeable with this plan.        Ethelda Chick, MD 02/15/11 1452

## 2011-02-15 NOTE — ED Notes (Signed)
Patient transported to CT 

## 2011-02-15 NOTE — ED Notes (Signed)
MD at bedside. 

## 2011-02-15 NOTE — ED Notes (Signed)
Pt states "the pain in my left chest started yesterday, that goes up into my neck & jaw, this has been going off/on & every time have nausea, has been going on a little over a month"

## 2011-02-20 ENCOUNTER — Encounter (HOSPITAL_COMMUNITY): Payer: Self-pay | Admitting: Emergency Medicine

## 2011-02-20 ENCOUNTER — Other Ambulatory Visit: Payer: Self-pay

## 2011-02-20 ENCOUNTER — Emergency Department (HOSPITAL_COMMUNITY)
Admission: EM | Admit: 2011-02-20 | Discharge: 2011-02-21 | Disposition: A | Discharge status: Home / Self Care | Payer: Self-pay | Attending: Emergency Medicine | Admitting: Emergency Medicine

## 2011-02-20 DIAGNOSIS — R011 Cardiac murmur, unspecified: Secondary | ICD-10-CM | POA: Insufficient documentation

## 2011-02-20 DIAGNOSIS — R0789 Other chest pain: Secondary | ICD-10-CM | POA: Insufficient documentation

## 2011-02-20 DIAGNOSIS — F411 Generalized anxiety disorder: Secondary | ICD-10-CM | POA: Insufficient documentation

## 2011-02-20 DIAGNOSIS — E669 Obesity, unspecified: Secondary | ICD-10-CM | POA: Insufficient documentation

## 2011-02-20 DIAGNOSIS — M542 Cervicalgia: Secondary | ICD-10-CM | POA: Insufficient documentation

## 2011-02-20 DIAGNOSIS — K219 Gastro-esophageal reflux disease without esophagitis: Secondary | ICD-10-CM | POA: Insufficient documentation

## 2011-02-20 DIAGNOSIS — R059 Cough, unspecified: Secondary | ICD-10-CM | POA: Insufficient documentation

## 2011-02-20 DIAGNOSIS — R05 Cough: Secondary | ICD-10-CM | POA: Insufficient documentation

## 2011-02-20 LAB — CBC
MCH: 26.8 pg (ref 26.0–34.0)
MCHC: 31.7 g/dL (ref 30.0–36.0)
MCV: 84.5 fL (ref 78.0–100.0)
Platelets: 364 10*3/uL (ref 150–400)
RDW: 14.2 % (ref 11.5–15.5)

## 2011-02-20 LAB — POCT I-STAT TROPONIN I: Troponin i, poc: 0 ng/mL (ref 0.00–0.08)

## 2011-02-20 LAB — BASIC METABOLIC PANEL
Calcium: 11 mg/dL — ABNORMAL HIGH (ref 8.4–10.5)
Creatinine, Ser: 1.04 mg/dL (ref 0.50–1.10)
GFR calc non Af Amer: 64 mL/min — ABNORMAL LOW (ref >90)
Sodium: 139 mEq/L (ref 135–145)

## 2011-02-20 MED ORDER — GI COCKTAIL ~~LOC~~
30.0000 mL | Freq: Once | ORAL | Status: AC
  Administered 2011-02-20: 30 mL via ORAL
  Filled 2011-02-20: qty 30

## 2011-02-20 MED ORDER — IBUPROFEN 200 MG PO TABS
600.0000 mg | ORAL_TABLET | Freq: Once | ORAL | Status: AC
  Administered 2011-02-20: 600 mg via ORAL
  Filled 2011-02-20: qty 3

## 2011-02-20 NOTE — ED Notes (Signed)
Pt st's she had onset of mid chest pain radiating into left arm and left side of neck onset approx 5pm today.  St's now she has a heavy feeling in chest.

## 2011-02-20 NOTE — ED Provider Notes (Signed)
History     CSN: [id number] Arrival date & time: 02/20/2011  5:54 PM   First MD Initiated Contact with Patient 02/20/11 2053      Chief Complaint  Patient presents with  . Chest Pain    (Consider location/radiation/quality/duration/timing/severity/associated sxs/prior treatment) HPI Comments: Patient presents for chest pain that radiates to her neck patient states that this is her first time receiving this chest pain it began at 5 p.m. today however in reviewing her chart she been complaining about this exact chest pain for over 2 months.  Patient has had a negative cardiac stress test back in October by her cardiologist Dr. Durenda Age.  She was also evaluated by Dr. Karma Ganja on December 14.  At this time a PE was ruled out with a negative CTA.  Patient denies shortness of breath extremity swelling history of hypertension hyperlipidemia MI, PE, DVT.  Patient also denies cough, DOE, orthopnea, PND, headaches, dizziness, lightheadedness, syncope, fevers, night sweats, chills.  Patient does report a history of GERD and is currently taking Prilosec.  Patient is a 45 y.o. female presenting with chest pain. The history is provided by the patient.  Chest Pain The chest pain began more  than 1 month ago. Chest pain occurs intermittently. The chest pain is unchanged. At its most intense, the pain is at 4/10. The severity of the pain is mild. The quality of the pain is described as sharp. The pain radiates to the left neck. Primary symptoms include cough. Pertinent negatives for primary symptoms include no fever, no fatigue, no syncope, no shortness of breath, no wheezing, no palpitations, no abdominal pain, no nausea, no vomiting, no dizziness and no altered mental status.  Pertinent negatives for associated symptoms include no claudication, no diaphoresis, no lower extremity edema, no near-syncope, no numbness, no orthopnea, no paroxysmal nocturnal dyspnea and no weakness. She tried nothing for the symptoms.  Risk factors include obesity and lack of exercise.  Pertinent negatives for past medical history include no aortic dissection, no arrhythmia, no CAD, no CHF (Dx on 8/24, but no documentation in chart from cardiology or prior echo study ), no diabetes, no DVT, no hypertension, no MI, no PE and no strokes.  Procedure history is positive for exercise treadmill test (no ischemia seen on 12/26/2010).     Past Medical History  Diagnosis Date  . Anemia   . Anxiety   . GERD (gastroesophageal reflux disease)   . Obesity   . Hemorrhoids   . Anemia   . Fibroids   . Heart murmur     History reviewed. No pertinent past surgical history.  Family History  Problem Relation Age of Onset  . Colon cancer Maternal Uncle   . Breast cancer Maternal Aunt   . Heart disease Maternal Aunt   . Pancreatic cancer Maternal Grandmother   . Diabetes Maternal Uncle   . Hypertension Mother     History  Substance Use Topics  . Smoking status: Never Smoker   . Smokeless tobacco: Never Used  . Alcohol Use: Yes     2 a month     OB History    Grav Para Term Preterm Abortions TAB SAB Ect Mult Living                  Review of Systems  Constitutional: Negative for fever, chills, diaphoresis and fatigue.  HENT: Negative for neck pain and neck stiffness.   Respiratory: Positive for cough and chest tightness. Negative for shortness of breath, wheezing  and stridor.   Cardiovascular: Positive for chest pain. Negative for palpitations, orthopnea, claudication, syncope and near-syncope.  Gastrointestinal: Negative for nausea, vomiting and abdominal pain.  Skin: Negative for pallor.  Neurological: Negative for dizziness, syncope, weakness, numbness and headaches.  Psychiatric/Behavioral: Negative for altered mental status.    Allergies  Food and Flagyl  Home Medications   Current Outpatient Rx  Name Route Sig Dispense Refill  . ASPIRIN 325 MG PO TBEC Oral Take 650 mg by mouth every 4 (four) hours as  needed. For arm pain     . CYCLOBENZAPRINE HCL 5 MG PO TABS Oral Take 5 mg by mouth 3 (three) times daily as needed. spasms     . FERROUS GLUCONATE 324 (38 FE) MG PO TABS Oral Take 324 mg by mouth daily with breakfast.      . IBUPROFEN 200 MG PO TABS Oral Take 200 mg by mouth every 6 (six) hours as needed. For arm pain     . MULTIVITAL PO TABS Oral Take 1 tablet by mouth daily.      Marland Kitchen OMEPRAZOLE 20 MG PO CPDR Oral Take 20 mg by mouth daily.        BP 121/70  Pulse 72  Temp(Src) 98.2 F (36.8 C) (Oral)  Resp 12  Wt 220 lb (99.791 kg)  SpO2 100%  LMP 02/11/2011  Physical Exam  Nursing note and vitals reviewed.   ED Course  Procedures (including critical care time)  Labs Reviewed  CBC - Abnormal; Notable for the following:    Hemoglobin 10.4 (*)    HCT 32.8 (*)    All other components within normal limits  BASIC METABOLIC PANEL - Abnormal; Notable for the following:    Calcium 11.0 (*)    GFR calc non Af Amer 64 (*)    GFR calc Af Amer 75 (*)    All other components within normal limits  POCT I-STAT TROPONIN I  POCT CARDIAC MARKERS  I-STAT TROPONIN I   No results found.   No diagnosis found.  VSS, NAD, no current complaints. Pt to be dc with follow up. Trop negative & recent wk up/scan for CTA on the 14th showed no PE   MDM  Patient is currently having left-sided reproducible chest pain.  She states she will followup with her cardiologist upon discharge for further evaluation. This plan has been discussed w Dr. Denton Lank who agrees with dc & f-u.          Jaci Carrel, PA 02/20/11 2319

## 2011-02-21 ENCOUNTER — Telehealth: Payer: Self-pay | Admitting: Cardiovascular Disease

## 2011-02-21 NOTE — Telephone Encounter (Signed)
New Problem:   Patient called in to schedule an appointment with Dr. Clifton James.  Was in the ER last night with chest pain, numbness on the left side of her face, and left shoulder/arm and abdomen pain. Was having pain in her left forearm and shoulder for the past month. I have scheduled an appointment in Jan. Please advise.

## 2011-02-21 NOTE — Telephone Encounter (Signed)
ED note reviewed. I spoke with pt and she feels scheduled appointment in January with Dr. Clifton James will be OK.  I told her to call if she has problems prior to this appt.

## 2011-02-27 NOTE — ED Provider Notes (Signed)
Medical screening examination/treatment/procedure(s) were conducted as a shared visit with non-physician practitioner(s) and myself.  I personally evaluated the patient during the encounter   Suzi Roots, MD 02/27/11 [phone]

## 2011-03-19 ENCOUNTER — Encounter (HOSPITAL_COMMUNITY): Payer: Self-pay | Admitting: *Deleted

## 2011-03-19 ENCOUNTER — Emergency Department (HOSPITAL_COMMUNITY): Payer: Self-pay

## 2011-03-19 ENCOUNTER — Other Ambulatory Visit: Payer: Self-pay

## 2011-03-19 ENCOUNTER — Emergency Department (HOSPITAL_COMMUNITY)
Admission: EM | Admit: 2011-03-19 | Discharge: 2011-03-19 | Disposition: A | Discharge status: Home / Self Care | Payer: Self-pay | Attending: Emergency Medicine | Admitting: Emergency Medicine

## 2011-03-19 DIAGNOSIS — R079 Chest pain, unspecified: Secondary | ICD-10-CM | POA: Insufficient documentation

## 2011-03-19 DIAGNOSIS — Z7982 Long term (current) use of aspirin: Secondary | ICD-10-CM | POA: Insufficient documentation

## 2011-03-19 DIAGNOSIS — K219 Gastro-esophageal reflux disease without esophagitis: Secondary | ICD-10-CM | POA: Insufficient documentation

## 2011-03-19 DIAGNOSIS — M79603 Pain in arm, unspecified: Secondary | ICD-10-CM

## 2011-03-19 DIAGNOSIS — M7989 Other specified soft tissue disorders: Secondary | ICD-10-CM | POA: Insufficient documentation

## 2011-03-19 DIAGNOSIS — Z79899 Other long term (current) drug therapy: Secondary | ICD-10-CM | POA: Insufficient documentation

## 2011-03-19 DIAGNOSIS — M79609 Pain in unspecified limb: Secondary | ICD-10-CM | POA: Insufficient documentation

## 2011-03-19 LAB — POCT I-STAT TROPONIN I

## 2011-03-19 MED ORDER — HYDROCODONE-ACETAMINOPHEN 5-325 MG PO TABS: 2.0000 | ORAL_TABLET | ORAL | Status: AC | PRN

## 2011-03-19 MED ORDER — MELOXICAM 7.5 MG PO TABS: 7.5000 mg | ORAL_TABLET | Freq: Every day | ORAL | Status: DC

## 2011-03-19 NOTE — ED Provider Notes (Signed)
History     CSN: [id number]  Arrival date & time 03/19/11  1842   First MD Initiated Contact with Patient 03/19/11 2046      Chief Complaint  Patient presents with  . Arm Injury    (Consider location/radiation/quality/duration/timing/severity/associated sxs/prior treatment) HPI Comments: Patient here with a three week history of pain and swelling sensation under her left arm - states no abscess formation, reports feels like the area is swelling and there is a burning sensation present - denies fever, chills, reports occasionally the pain with go into her left upper chest area - denies shortness of breath cough, breast pain.  Patient is a 46 y.o. female presenting with arm injury. The history is provided by the patient. No language interpreter was used.  Arm Injury  Episode onset: three weeks ago. The incident occurred at home. Injury mechanism: no known injury. The pain is moderate. It is unlikely that a foreign body is present. Associated symptoms include chest pain. Pertinent negatives include no nausea, no vomiting, no headaches, no hearing loss, no neck pain, no focal weakness, no light-headedness, no loss of consciousness, no tingling, no weakness, no cough, no difficulty breathing and no memory loss. There have been no prior injuries to these areas. She is right-handed. She has been behaving normally. There were no sick contacts. She has received no recent medical care.    Past Medical History  Diagnosis Date  . Anemia   . Anxiety   . GERD (gastroesophageal reflux disease)   . Obesity   . Hemorrhoids   . Anemia   . Fibroids   . Heart murmur     History reviewed. No pertinent past surgical history.  Family History  Problem Relation Age of Onset  . Colon cancer Maternal Uncle   . Breast cancer Maternal Aunt   . Heart disease Maternal Aunt   . Pancreatic cancer Maternal Grandmother   . Diabetes Maternal Uncle   . Hypertension Mother     History  Substance Use Topics    . Smoking status: Never Smoker   . Smokeless tobacco: Never Used  . Alcohol Use: Yes     2 a month     OB History    Grav Para Term Preterm Abortions TAB SAB Ect Mult Living                  Review of Systems  HENT: Negative for hearing loss and neck pain.   Respiratory: Negative for cough.   Cardiovascular: Positive for chest pain.  Gastrointestinal: Negative for nausea and vomiting.  Neurological: Negative for tingling, focal weakness, loss of consciousness, weakness, light-headedness and headaches.  Psychiatric/Behavioral: Negative for memory loss.  All other systems reviewed and are negative.    Allergies  Food and Flagyl  Home Medications   Current Outpatient Rx  Name Route Sig Dispense Refill  . ASPIRIN 325 MG PO TBEC Oral Take 325 mg by mouth every 4 (four) hours as needed. For arm pain    . CYCLOBENZAPRINE HCL 5 MG PO TABS Oral Take 5 mg by mouth 3 (three) times daily as needed. spasms     . FERROUS SULFATE 325 (65 FE) MG PO TABS Oral Take 325 mg by mouth daily with breakfast.    . IBUPROFEN 200 MG PO TABS Oral Take 400 mg by mouth every 6 (six) hours as needed. For arm pain    . ADULT MULTIVITAMIN W/MINERALS CH Oral Take 1 tablet by mouth daily.    Marland Kitchen  OMEPRAZOLE 20 MG PO CPDR Oral Take 20 mg by mouth daily as needed. For reflux    . ALKA-SELTZER PLUS COLD & FLU PO Oral Take 2 tablets by mouth daily as needed. For flu-like symptoms      BP 102/58  Pulse 73  Temp(Src) 98.4 F (36.9 C) (Oral)  Resp 15  SpO2 99%  LMP 03/10/2011  Physical Exam  Nursing note and vitals reviewed. Constitutional: She is oriented to person, place, and time. She appears well-developed and well-nourished. No distress.  HENT:  Head: Normocephalic and atraumatic.  Right Ear: External ear normal.  Left Ear: External ear normal.  Nose: Nose normal.  Mouth/Throat: Oropharynx is clear and moist. No oropharyngeal exudate.  Eyes: Conjunctivae are normal. Pupils are equal, round, and  reactive to light. No scleral icterus.  Neck: Normal range of motion. Neck supple.  Cardiovascular: Normal rate, regular rhythm and normal heart sounds.  Exam reveals no gallop and no friction rub.   No murmur heard. Pulmonary/Chest: Effort normal and breath sounds normal. She exhibits no tenderness.  Abdominal: Soft. Bowel sounds are normal. She exhibits no distension. There is no tenderness.  Musculoskeletal: Normal range of motion. She exhibits tenderness. She exhibits no edema.       Right upper arm: She exhibits tenderness. She exhibits no bony tenderness, no swelling and no edema.  Lymphadenopathy:    She has no cervical adenopathy.       Right axillary: No pectoral and no lateral adenopathy present.       Left axillary: No pectoral and no lateral adenopathy present. Neurological: She is alert and oriented to person, place, and time. No cranial nerve deficit.  Skin: Skin is warm and dry.  Psychiatric: She has a normal mood and affect. Her behavior is normal. Judgment and thought content normal.    ED Course  Procedures (including critical care time)   Labs Reviewed  POCT I-STAT TROPONIN I  I-STAT TROPONIN I   Dg Chest 2 View  03/19/2011  *RADIOLOGY REPORT*  Clinical Data: Upper left chest pain and axillary pain; left upper arm swelling and pain.  CHEST - 2 VIEW  Comparison: Chest radiograph and CTA of the chest performed 02/15/2011  Findings: The lungs are well-aerated and clear.  There is no evidence of focal opacification, significant pleural effusion or pneumothorax.  Previously noted trace bilateral pleural effusions are not readily characterized on radiograph.  The heart is normal in size; the mediastinal contour is within normal limits.  No acute osseous abnormalities are seen.  IMPRESSION: No acute cardiopulmonary process seen.  Original Report Authenticated By: Tonia Ghent, M.D.    Date: 03/19/2011  Rate: 65  Rhythm: normal sinus rhythm  QRS Axis: right  Intervals:  normal  ST/T Wave abnormalities: normal  Conduction Disutrbances:none  Narrative Interpretation: Reviewed by Dr. Ignacia Palma  Old EKG Reviewed: unchanged  Results for orders placed during the hospital encounter of 03/19/11  POCT I-STAT TROPONIN I      Component Value Range   Troponin i, poc 0.00  0.00 - 0.08 (ng/mL)   Comment 3            Dg Chest 2 View  03/19/2011  *RADIOLOGY REPORT*  Clinical Data: Upper left chest pain and axillary pain; left upper arm swelling and pain.  CHEST - 2 VIEW  Comparison: Chest radiograph and CTA of the chest performed 02/15/2011  Findings: The lungs are well-aerated and clear.  There is no evidence of focal opacification, significant pleural effusion  or pneumothorax.  Previously noted trace bilateral pleural effusions are not readily characterized on radiograph.  The heart is normal in size; the mediastinal contour is within normal limits.  No acute osseous abnormalities are seen.  IMPRESSION: No acute cardiopulmonary process seen.  Original Report Authenticated By: Tonia Ghent, M.D.       Left arm pain    MDM  EKG, troponin I and chest x-ray negative - I believe this to be either a lymphadenopathy or MSK pain - will give short course of pain medications.  Medical screening examination/treatment/procedure(s) were performed by non-physician practitioner and as supervising physician I was immediately available for consultation/collaboration. Osvaldo Human, M.D.        Izola Price [place], [place] 03/19/11 2312  Carleene Cooper III, MD 03/20/11 [phone]

## 2011-03-19 NOTE — ED Notes (Signed)
She has had a swelling under her lt arm for 3 weeks getting more swollen and irritated the past few days

## 2011-03-27 ENCOUNTER — Encounter: Payer: Self-pay | Admitting: Obstetrics & Gynecology

## 2011-03-29 ENCOUNTER — Ambulatory Visit: Payer: Self-pay | Admitting: Cardiovascular Disease

## 2011-04-01 ENCOUNTER — Other Ambulatory Visit (HOSPITAL_COMMUNITY): Payer: Self-pay | Admitting: Family Medicine

## 2011-04-01 DIAGNOSIS — Z1231 Encounter for screening mammogram for malignant neoplasm of breast: Secondary | ICD-10-CM

## 2011-04-09 ENCOUNTER — Ambulatory Visit: Payer: Self-pay

## 2011-04-09 ENCOUNTER — Ambulatory Visit (HOSPITAL_COMMUNITY)
Admission: RE | Admit: 2011-04-09 | Discharge: 2011-04-09 | Disposition: A | Discharge status: Home / Self Care | Payer: Self-pay | Source: Ambulatory Visit | Attending: Family Medicine | Admitting: Family Medicine

## 2011-04-09 DIAGNOSIS — Z1231 Encounter for screening mammogram for malignant neoplasm of breast: Secondary | ICD-10-CM

## 2011-04-17 ENCOUNTER — Other Ambulatory Visit: Payer: Self-pay | Admitting: Family Medicine

## 2011-04-17 DIAGNOSIS — R928 Other abnormal and inconclusive findings on diagnostic imaging of breast: Secondary | ICD-10-CM

## 2011-05-10 ENCOUNTER — Encounter: Payer: Self-pay | Admitting: *Deleted

## 2011-05-10 ENCOUNTER — Ambulatory Visit (INDEPENDENT_AMBULATORY_CARE_PROVIDER_SITE_OTHER): Payer: Self-pay | Admitting: *Deleted

## 2011-05-10 ENCOUNTER — Ambulatory Visit
Admission: RE | Admit: 2011-05-10 | Discharge: 2011-05-10 | Disposition: A | Discharge status: Home / Self Care | Payer: No Typology Code available for payment source | Source: Ambulatory Visit | Attending: Family Medicine | Admitting: Family Medicine

## 2011-05-10 VITALS — BP 114/73 | HR 77 | Temp 98.7°F | Ht 64.0 in | Wt 235.8 lb

## 2011-05-10 DIAGNOSIS — R928 Other abnormal and inconclusive findings on diagnostic imaging of breast: Secondary | ICD-10-CM

## 2011-05-10 DIAGNOSIS — Z1239 Encounter for other screening for malignant neoplasm of breast: Secondary | ICD-10-CM

## 2011-05-10 NOTE — Patient Instructions (Signed)
Taught patient how to perform BSE and gave educational materials to take home. Patient did not need a Pap smear today due to last Pap smear was 03/26/11 per patient. Let her know [facility] will cover Pap smears every 3 years unless has a history of abnormal Pap smears. Patient is scheduled for a right breast diagnostic mammogram today,  Friday, May 10, 2011 at 1340. Patient aware of appointment and will be there. Let patient know will follow up with her within the next couple weeks with results. Patient verbalized understanding.

## 2011-05-10 NOTE — Progress Notes (Signed)
Referred from the [facility] due to needing additional imaging of right breast. Screening mammogram was completed 04/09/11.  Pap Smear:    Pap smear not performed today. Per patient last Pap smear was 03/26/11 at the [facility] in [place]. Patient stated has not received result but was told if didn't hear anything that meant it was normal. Will request results to verify if normal. Per patient she has no history of abnormal Pap smears. No Pap smear results in EPIC.  Physical exam: Breasts Breasts symmetrical. No skin abnormalities bilateral breasts. No nipple retraction bilateral breasts. No nipple discharge bilateral breasts. No lymphadenopathy. No lumps palpated left breast. Palpated small lump in the right breast at 7 o'clock. Patient complained of tenderness when palpated lump. Patient referred to the [facility] for right Diagnostic Mammogram and possible right breast ultrasound Friday, May 10, 2011 at 1340.         Pelvic/Bimanual No Pap smear completed today since last Pap smear was 03/26/11. Pap smear not indicated per [facility] guidelines.

## 2011-06-11 ENCOUNTER — Encounter (HOSPITAL_COMMUNITY): Payer: Self-pay | Admitting: *Deleted

## 2011-06-11 ENCOUNTER — Emergency Department (INDEPENDENT_AMBULATORY_CARE_PROVIDER_SITE_OTHER)
Admission: EM | Admit: 2011-06-11 | Discharge: 2011-06-11 | Disposition: A | Discharge status: Home / Self Care | Payer: Self-pay | Source: Home / Self Care | Attending: Family Medicine | Admitting: Family Medicine

## 2011-06-11 DIAGNOSIS — J019 Acute sinusitis, unspecified: Secondary | ICD-10-CM

## 2011-06-11 MED ORDER — AMOXICILLIN 500 MG PO CAPS: 500.0000 mg | ORAL_CAPSULE | Freq: Three times a day (TID) | ORAL | Status: AC

## 2011-06-11 MED ORDER — FLUTICASONE PROPIONATE 50 MCG/ACT NA SUSP: 2.0000 | Freq: Every day | NASAL | Status: DC

## 2011-06-11 NOTE — ED Notes (Signed)
3rd day of left eye, sinus and dental pain.  She is getting over a cold, she reports.  No fever at home

## 2011-06-11 NOTE — ED Provider Notes (Signed)
History     CSN: [id number]  Arrival date & time 06/11/11  0820   First MD Initiated Contact with Patient 06/11/11 [phone]      Chief Complaint  Patient presents with  . Facial Pain    (Consider location/radiation/quality/duration/timing/severity/associated sxs/prior treatment) HPI Comments: Keilana presents for evaluation of left-sided jaw, tooth, sinus pain, and pressure behind her eyes. She reports also purulent drainage at times, but denies any fever. She reports she had a common cold 2 weeks ago. She does also report a history of seasonal allergies, for which she takes an occasional Benadryl or Sudafed. She denies any dental injury.  Patient is a 46 y.o. female presenting with sinusitis. The history is provided by the patient.  Sinusitis  This is a new problem. The current episode started more than 2 days ago. The problem has not changed since onset.There has been no fever. The pain is mild. The pain has been constant since onset. Associated symptoms include congestion and sinus pressure. Pertinent negatives include no chills, no sweats and no sore throat.    Past Medical History  Diagnosis Date  . Anemia   . Anxiety   . GERD (gastroesophageal reflux disease)   . Obesity   . Hemorrhoids   . Anemia   . Fibroids   . Heart murmur     History reviewed. No pertinent past surgical history.  Family History  Problem Relation Age of Onset  . Colon cancer Maternal Uncle   . Breast cancer Maternal Aunt   . Heart disease Maternal Aunt   . Pancreatic cancer Maternal Grandmother   . Cancer Maternal Grandmother     pancreas  . Diabetes Maternal Uncle   . Hypertension Mother     History  Substance Use Topics  . Smoking status: Never Smoker   . Smokeless tobacco: Never Used  . Alcohol Use: Yes     occasionally    OB History    Grav Para Term Preterm Abortions TAB SAB Ect Mult Living   1 1 1       1       Review of Systems  Constitutional: Negative.  Negative for fever  and chills.  HENT: Positive for congestion and sinus pressure. Negative for sore throat.   Eyes: Negative.   Respiratory: Negative.   Cardiovascular: Negative.   Gastrointestinal: Negative.   Genitourinary: Negative.   Musculoskeletal: Negative.   Skin: Negative.   Neurological: Negative.     Allergies  Food and Flagyl  Home Medications   Current Outpatient Rx  Name Route Sig Dispense Refill  . ASPIRIN 325 MG PO TBEC Oral Take 325 mg by mouth every 4 (four) hours as needed. For arm pain    . ASPIRIN 81 MG PO TABS Oral Take 81 mg by mouth daily as needed.    Marland Kitchen FERROUS SULFATE 325 (65 FE) MG PO TABS Oral Take 325 mg by mouth daily with breakfast.    . IBUPROFEN 200 MG PO TABS Oral Take 400 mg by mouth every 6 (six) hours as needed. For arm pain    . ADULT MULTIVITAMIN W/MINERALS CH Oral Take 1 tablet by mouth daily.    Marland Kitchen OMEPRAZOLE 20 MG PO CPDR Oral Take 20 mg by mouth daily as needed. For reflux    . AMOXICILLIN 500 MG PO CAPS Oral Take 1 capsule (500 mg total) by mouth 3 (three) times daily. 30 capsule 0  . CYCLOBENZAPRINE HCL 5 MG PO TABS Oral Take 5 mg by mouth  3 (three) times daily as needed. spasms     . FLUTICASONE PROPIONATE 50 MCG/ACT NA SUSP Nasal Place 2 sprays into the nose daily. 16 g 2  . MELOXICAM 7.5 MG PO TABS Oral Take 1 tablet (7.5 mg total) by mouth daily. 30 tablet 0  . ALKA-SELTZER PLUS COLD & FLU PO Oral Take 2 tablets by mouth daily as needed. For flu-like symptoms      BP 108/64  Pulse 64  Temp(Src) 97.9 F (36.6 C) (Oral)  Resp 12  SpO2 98%  LMP 06/01/2011  Physical Exam  Nursing note and vitals reviewed. Constitutional: She is oriented to person, place, and time. She appears well-developed and well-nourished.  HENT:  Head: Normocephalic and atraumatic.  Right Ear: Tympanic membrane is retracted.  Left Ear: Tympanic membrane is retracted.  Nose: Right sinus exhibits maxillary sinus tenderness. Right sinus exhibits no frontal sinus tenderness.  Left sinus exhibits maxillary sinus tenderness. Left sinus exhibits no frontal sinus tenderness.  Mouth/Throat: Uvula is midline, oropharynx is clear and moist and mucous membranes are normal.  Eyes: EOM are normal.  Neck: Normal range of motion.  Cardiovascular: Normal rate, regular rhythm, S1 normal, S2 normal and normal heart sounds.   No murmur heard. Pulmonary/Chest: Effort normal and breath sounds normal. She has no decreased breath sounds. She has no wheezes. She has no rhonchi.  Musculoskeletal: Normal range of motion.  Neurological: She is alert and oriented to person, place, and time.  Skin: Skin is warm and dry.  Psychiatric: Her behavior is normal.    ED Course  Procedures (including critical care time)  Labs Reviewed - No data to display No results found.   1. Acute rhinosinusitis       MDM  rx given for fluticasone and amoxicillin       Renaee Munda, MD 06/11/11 [phone]

## 2011-06-11 NOTE — Discharge Instructions (Signed)
Use prescription nasal steroid spray as directed. May also use an over the counter antihistamine such as loratadine (Claritin), cetirizine (Zyrtec), or fexofenadine (Allegra). If no improvement over the next 48 to 72 hours, may fill the antibiotic prescription and complete full course. Return to care should your symptoms not improve, or worsen in any way.

## 2011-06-18 ENCOUNTER — Encounter (HOSPITAL_COMMUNITY): Payer: Self-pay | Admitting: Emergency Medicine

## 2011-06-18 ENCOUNTER — Emergency Department (INDEPENDENT_AMBULATORY_CARE_PROVIDER_SITE_OTHER)
Admission: EM | Admit: 2011-06-18 | Discharge: 2011-06-18 | Disposition: A | Discharge status: Home / Self Care | Payer: Self-pay | Source: Home / Self Care | Attending: Family Medicine | Admitting: Family Medicine

## 2011-06-18 DIAGNOSIS — K5289 Other specified noninfective gastroenteritis and colitis: Secondary | ICD-10-CM

## 2011-06-18 DIAGNOSIS — K529 Noninfective gastroenteritis and colitis, unspecified: Secondary | ICD-10-CM

## 2011-06-18 MED ORDER — ONDANSETRON 4 MG PO TBDP: 4.0000 mg | ORAL_TABLET | Freq: Three times a day (TID) | ORAL | Status: AC | PRN

## 2011-06-18 NOTE — ED Provider Notes (Signed)
History     CSN: [id number]  Arrival date & time 06/18/11  1534   First MD Initiated Contact with Patient 06/18/11 1551      Chief Complaint  Patient presents with  . Abdominal Pain    (Consider location/radiation/quality/duration/timing/severity/associated sxs/prior treatment) HPI Patient presents with 3 days of nausea, vomiting, diarrhea. She states that the vomiting has been on since Monday and the diarrhea is slowing down. She has had 6-7 stools today but has not had one since 11 AM. She has not seen any blood or mucus in the stool. She describes it as watery. She is having some crampy abdominal pain but this is improving. She's not had any fevers. She is able to keep food and liquid down. She states that her daughter had the same illness last week and she went over to take care of her on Friday.  She had a recent illness and was diagnosed as sinusitis. She was taking amoxicillin since the night. She stopped her amoxicillin today she got sick Past Medical History  Diagnosis Date  . Anemia   . Anxiety   . GERD (gastroesophageal reflux disease)   . Obesity   . Hemorrhoids   . Anemia   . Fibroids   . Heart murmur     History reviewed. No pertinent past surgical history.  Family History  Problem Relation Age of Onset  . Colon cancer Maternal Uncle   . Breast cancer Maternal Aunt   . Heart disease Maternal Aunt   . Pancreatic cancer Maternal Grandmother   . Cancer Maternal Grandmother     pancreas  . Diabetes Maternal Uncle   . Hypertension Mother     History  Substance Use Topics  . Smoking status: Never Smoker   . Smokeless tobacco: Never Used  . Alcohol Use: Yes     occasionally    OB History    Grav Para Term Preterm Abortions TAB SAB Ect Mult Living   1 1 1       1       Review of Systems As above. Otherwise negative Allergies  Food and Flagyl  Home Medications   Current Outpatient Rx  Name Route Sig Dispense Refill  . AMOXICILLIN 500 MG PO CAPS  Oral Take 1 capsule (500 mg total) by mouth 3 (three) times daily. 30 capsule 0  . ASPIRIN 325 MG PO TBEC Oral Take 325 mg by mouth every 4 (four) hours as needed. For arm pain    . ASPIRIN 81 MG PO TABS Oral Take 81 mg by mouth daily as needed.    . CYCLOBENZAPRINE HCL 5 MG PO TABS Oral Take 5 mg by mouth 3 (three) times daily as needed. spasms     . FERROUS SULFATE 325 (65 FE) MG PO TABS Oral Take 325 mg by mouth daily with breakfast.    . FLUTICASONE PROPIONATE 50 MCG/ACT NA SUSP Nasal Place 2 sprays into the nose daily. 16 g 2  . IBUPROFEN 200 MG PO TABS Oral Take 400 mg by mouth every 6 (six) hours as needed. For arm pain    . MELOXICAM 7.5 MG PO TABS Oral Take 1 tablet (7.5 mg total) by mouth daily. 30 tablet 0  . ADULT MULTIVITAMIN W/MINERALS CH Oral Take 1 tablet by mouth daily.    Marland Kitchen OMEPRAZOLE 20 MG PO CPDR Oral Take 20 mg by mouth daily as needed. For reflux    . ALKA-SELTZER PLUS COLD & FLU PO Oral Take 2 tablets by  mouth daily as needed. For flu-like symptoms      BP 108/72  Pulse 92  Temp(Src) 98.7 F (37.1 C) (Oral)  Resp 16  SpO2 100%  LMP 06/01/2011  Physical Exam Vital signs reviewed General appearance - alert, well appearing, and in no distress Heart - normal rate, regular rhythm, normal S1, S2, no murmurs, rubs, clicks or gallops Chest - clear to auscultation, no wheezes, rales or rhonchi, symmetric air entry, no tachypnea, retractions or cyanosis Abdomen - soft, mild diffuse tenderness. Hyperactive bowel sounds. Extremities - peripheral pulses normal, no pedal edema, no clubbing or cyanosis   ED Course  Procedures (including critical care time)  Labs Reviewed - No data to display No results found.  Viral gastroenteritis   MDM  1. Viral gastroenteritis-patient is improving but still has some nausea. plan small sips of fluids and bland foods. We'll give Zofran today. Gave red flags return.        Reginold Agent, MD 06/18/11 [phone]

## 2011-06-18 NOTE — ED Notes (Signed)
Onset Sunday evening of feeling queesy, then progressed to vomiting, frequent vomiting, then diarrhea started.  Vomiting stopped around 3:00am last night, but diarrhea continued. Denies lightheaded, just feels weak.  Sips of liquids are staying down.  Currently denies pain

## 2011-06-18 NOTE — Discharge Instructions (Signed)
Diet for Diarrhea, Adult Having frequent, runny stools (diarrhea) has many causes. Diarrhea may be caused or worsened by food or drink. Diarrhea may be relieved by changing your diet. IF YOU ARE NOT TOLERATING SOLID FOODS:  Drink enough water and fluids to keep your urine clear or pale yellow.   Avoid sugary drinks and sodas as well as milk-based beverages.   Avoid beverages containing caffeine and alcohol.   You may try rehydrating beverages. You can make your own by following this recipe:    tsp table salt.    tsp baking soda.   ? tsp salt substitute (potassium chloride).   1 tbs + 1 tsp sugar.   1 qt water.  As your stools become more solid, you can start eating solid foods. Add foods one at a time. If a certain food causes your diarrhea to get worse, avoid that food and try other foods. A low fiber, low-fat, and lactose-free diet is recommended. Small, frequent meals may be better tolerated.  Starches  Allowed:  White, French, and pita breads, plain rolls, buns, bagels. Plain muffins, matzo. Soda, saltine, or graham crackers. Pretzels, melba toast, zwieback. Cooked cereals made with water: cornmeal, farina, cream cereals. Dry cereals: refined corn, wheat, rice. Potatoes prepared any way without skins, refined macaroni, spaghetti, noodles, refined rice.   Avoid:  Bread, rolls, or crackers made with whole wheat, multi-grains, rye, bran seeds, nuts, or coconut. Corn tortillas or taco shells. Cereals containing whole grains, multi-grains, bran, coconut, nuts, or raisins. Cooked or dry oatmeal. Coarse wheat cereals, granola. Cereals advertised as "high-fiber." Potato skins. Whole grain pasta, wild or brown rice. Popcorn. Sweet potatoes/yams. Sweet rolls, doughnuts, waffles, pancakes, sweet breads.  Vegetables  Allowed: Strained tomato and vegetable juices. Most well-cooked and canned vegetables without seeds. Fresh: Tender lettuce, cucumber without the skin, cabbage, spinach, bean  sprouts.   Avoid: Fresh, cooked, or canned: Artichokes, baked beans, beet greens, broccoli, Brussels sprouts, corn, kale, legumes, peas, sweet potatoes. Cooked: Green or red cabbage, spinach. Avoid large servings of any vegetables, because vegetables shrink when cooked, and they contain more fiber per serving than fresh vegetables.  Fruit  Allowed: All fruit juices except prune juice. Cooked or canned: Apricots, applesauce, cantaloupe, cherries, fruit cocktail, grapefruit, grapes, kiwi, mandarin oranges, peaches, pears, plums, watermelon. Fresh: Apples without skin, ripe banana, grapes, cantaloupe, cherries, grapefruit, peaches, oranges, plums. Keep servings limited to  cup or 1 piece.   Avoid: Fresh: Apple with skin, apricots, mango, pears, raspberries, strawberries. Prune juice, stewed or dried prunes. Dried fruits, raisins, dates. Large servings of all fresh fruits.  Meat and Meat Substitutes  Allowed: Ground or well-cooked tender beef, ham, veal, lamb, pork, or poultry. Eggs, plain cheese. Fish, oysters, shrimp, lobster, other seafoods. Liver, organ meats.   Avoid: Tough, fibrous meats with gristle. Peanut butter, smooth or chunky. Cheese, nuts, seeds, legumes, dried peas, beans, lentils.  Milk  Allowed: Yogurt, lactose-free milk, kefir, drinkable yogurt, buttermilk, soy milk.   Avoid: Milk, chocolate milk, beverages made with milk, such as milk shakes.  Soups  Allowed: Bouillon, broth, or soups made from allowed foods. Any strained soup.   Avoid: Soups made from vegetables that are not allowed, cream or milk-based soups.  Desserts and Sweets  Allowed: Sugar-free gelatin, sugar-free frozen ice pops made without sugar alcohol.   Avoid: Plain cakes and cookies, pie made with allowed fruit, pudding, custard, cream pie. Gelatin, fruit, ice, sherbet, frozen ice pops. Ice cream, ice milk without nuts. Plain hard candy,   honey, jelly, molasses, syrup, sugar, chocolate syrup, gumdrops,  marshmallows.  Fats and Oils  Allowed: Avoid any fats and oils.   Avoid: Seeds, nuts, olives, avocados. Margarine, butter, cream, mayonnaise, salad oils, plain salad dressings made from allowed foods. Plain gravy, crisp bacon without rind.  Beverages  Allowed: Water, decaffeinated teas, oral rehydration solutions, sugar-free beverages.   Avoid: Fruit juices, caffeinated beverages (coffee, tea, soda or pop), alcohol, sports drinks, or lemon-lime soda or pop.  Condiments  Allowed: Ketchup, mustard, horseradish, vinegar, cream sauce, cheese sauce, cocoa powder. Spices in moderation: allspice, basil, bay leaves, celery powder or leaves, cinnamon, cumin powder, curry powder, ginger, mace, marjoram, onion or garlic powder, oregano, paprika, parsley flakes, ground pepper, rosemary, sage, savory, tarragon, thyme, turmeric.   Avoid: Coconut, honey.  Weight Monitoring: Weigh yourself every day. You should weigh yourself in the morning after you urinate and before you eat breakfast. Wear the same amount of clothing when you weigh yourself. Record your weight daily. Bring your recorded weights to your clinic visits. Tell your caregiver right away if you have gained 3 lb/1.4 kg or more in 1 day, 5 lb/2.3 kg in a week, or whatever amount you were told to report. SEEK IMMEDIATE MEDICAL CARE IF:   You are unable to keep fluids down.   You start to throw up (vomit) or diarrhea keeps coming back (persistent).   Abdominal pain develops, increases, or can be felt in one place (localizes).   You have an oral temperature above 102 F (38.9 C), not controlled by medicine.   Diarrhea contains blood or mucus.   You develop excessive weakness, dizziness, fainting, or extreme thirst.  MAKE SURE YOU:   Understand these instructions.   Will watch your condition.   Will get help right away if you are not doing well or get worse.  Document Released: 05/11/2003 Document Revised: 02/07/2011 Document Reviewed:  09/01/2008 [employer] Patient Information 2012 [employer]. 

## 2011-06-19 NOTE — ED Provider Notes (Signed)
Medical screening examination/treatment/procedure(s) were performed by PGY-3 [facility] resident and as supervising physician I was immediately available for consultation/collaboration.   Sharin Grave, MD   Sharin Grave, MD 06/19/11 [phone]

## 2011-07-19 ENCOUNTER — Telehealth: Payer: Self-pay | Admitting: *Deleted

## 2011-07-19 NOTE — Telephone Encounter (Signed)
Patient returned call. Advised patient that area on diagnostic mammogram was benign and she would need yearly screenings. Patient voiced understanding.

## 2011-07-19 NOTE — Telephone Encounter (Signed)
Telephoned home # left message to return call to Christine Brannock [phone]. 

## 2011-08-22 ENCOUNTER — Emergency Department (HOSPITAL_COMMUNITY): Payer: Self-pay

## 2011-08-22 ENCOUNTER — Encounter (HOSPITAL_COMMUNITY): Payer: Self-pay | Admitting: Emergency Medicine

## 2011-08-22 ENCOUNTER — Emergency Department (HOSPITAL_COMMUNITY)
Admission: EM | Admit: 2011-08-22 | Discharge: 2011-08-22 | Disposition: A | Discharge status: Home / Self Care | Payer: Self-pay | Attending: Emergency Medicine | Admitting: Emergency Medicine

## 2011-08-22 DIAGNOSIS — R079 Chest pain, unspecified: Secondary | ICD-10-CM

## 2011-08-22 DIAGNOSIS — E669 Obesity, unspecified: Secondary | ICD-10-CM | POA: Insufficient documentation

## 2011-08-22 DIAGNOSIS — F411 Generalized anxiety disorder: Secondary | ICD-10-CM | POA: Insufficient documentation

## 2011-08-22 DIAGNOSIS — R0789 Other chest pain: Secondary | ICD-10-CM | POA: Insufficient documentation

## 2011-08-22 DIAGNOSIS — K219 Gastro-esophageal reflux disease without esophagitis: Secondary | ICD-10-CM | POA: Insufficient documentation

## 2011-08-22 LAB — BASIC METABOLIC PANEL WITH GFR
BUN: 11 mg/dL (ref 6–23)
Calcium: 10.4 mg/dL (ref 8.4–10.5)
Creatinine, Ser: 0.93 mg/dL (ref 0.50–1.10)
GFR calc Af Amer: 85 mL/min — ABNORMAL LOW (ref >90)
GFR calc non Af Amer: 73 mL/min — ABNORMAL LOW (ref >90)

## 2011-08-22 LAB — BASIC METABOLIC PANEL
CO2: 23 mEq/L (ref 19–32)
Chloride: 103 mEq/L (ref 96–112)
Glucose, Bld: 92 mg/dL (ref 70–99)
Potassium: 4.2 mEq/L (ref 3.5–5.1)
Sodium: 135 mEq/L (ref 135–145)

## 2011-08-22 LAB — POCT I-STAT TROPONIN I: Troponin i, poc: 0 ng/mL (ref 0.00–0.08)

## 2011-08-22 MED ORDER — MORPHINE SULFATE 4 MG/ML IJ SOLN: 4.0000 mg | Freq: Once | INTRAMUSCULAR | Status: DC

## 2011-08-22 MED ORDER — GI COCKTAIL ~~LOC~~
30.0000 mL | Freq: Once | ORAL | Status: AC
  Administered 2011-08-22: 30 mL via ORAL
  Filled 2011-08-22: qty 30

## 2011-08-22 NOTE — Discharge Instructions (Signed)
Follow up with your providers as dicussed in the ED today and as written above.  See your doctor immediately--or return to the ED--with any new or troubling symptoms including fevers, weakness, new chest pain, shortness or breath, numbness, or any other concerning symptom.    Chest Pain (Nonspecific)  It is often hard to give a specific diagnosis for the cause of chest pain. There is always a chance that your pain could be related to something serious, such as a heart attack or a blood clot in the lungs. You need to follow up with your caregiver for further evaluation. CAUSES   Heartburn.   Pneumonia or bronchitis.   Anxiety or stress.   Inflammation around your heart (pericarditis) or lung (pleuritis or pleurisy).   A blood clot in the lung.   A collapsed lung (pneumothorax). It can develop suddenly on its own (spontaneous pneumothorax) or from injury (trauma) to the chest.   Shingles infection (herpes zoster virus).  The chest wall is composed of bones, muscles, and cartilage. Any of these can be the source of the pain.  The bones can be bruised by injury.   The muscles or cartilage can be strained by coughing or overwork.   The cartilage can be affected by inflammation and become sore (costochondritis).  DIAGNOSIS  Lab tests or other studies, such as X-rays, electrocardiography, stress testing, or cardiac imaging, may be needed to find the cause of your pain.  TREATMENT   Treatment depends on what may be causing your chest pain. Treatment may include:   Acid blockers for heartburn.   Anti-inflammatory medicine.   Pain medicine for inflammatory conditions.   Antibiotics if an infection is present.   You may be advised to change lifestyle habits. This includes stopping smoking and avoiding alcohol, caffeine, and chocolate.   You may be advised to keep your head raised (elevated) when sleeping. This reduces the chance of acid going backward from your stomach into your  esophagus.   Most of the time, nonspecific chest pain will improve within 2 to 3 days with rest and mild pain medicine.  HOME CARE INSTRUCTIONS   If antibiotics were prescribed, take your antibiotics as directed. Finish them even if you start to feel better.   For the next few days, avoid physical activities that bring on chest pain. Continue physical activities as directed.   Do not smoke.   Avoid drinking alcohol.   Only take over-the-counter or prescription medicine for pain, discomfort, or fever as directed by your caregiver.   Follow your caregiver's suggestions for further testing if your chest pain does not go away.   Keep any follow-up appointments you made. If you do not go to an appointment, you could develop lasting (chronic) problems with pain. If there is any problem keeping an appointment, you must call to reschedule.  SEEK MEDICAL CARE IF:   You think you are having problems from the medicine you are taking. Read your medicine instructions carefully.   Your chest pain does not go away, even after treatment.   You develop a rash with blisters on your chest.  SEEK IMMEDIATE MEDICAL CARE IF:   You have increased chest pain or pain that spreads to your arm, neck, jaw, back, or abdomen.   You develop shortness of breath, an increasing cough, or you are coughing up blood.   You have severe back or abdominal pain, feel nauseous, or vomit.   You develop severe weakness, fainting, or chills.   You  have a fever.  THIS IS AN EMERGENCY. Do not wait to see if the pain will go away. Get medical help at once. Call your local emergency services (911 in [place] Do not drive yourself to the hospital. MAKE SURE YOU:   Understand these instructions.   Will watch your condition.   Will get help right away if you are not doing well or get worse.  Document Released: 11/28/2004 Document Revised: 02/07/2011 Document Reviewed: 09/24/2007 [facility] Patient Information 2012 [place],  [place].

## 2011-08-22 NOTE — ED Provider Notes (Signed)
History     CSN: [id number]  Arrival date & time 08/22/11  0915   First MD Initiated Contact with Patient 08/22/11 [phone]      Chief Complaint  Patient presents with  . Chest Pain    (Consider location/radiation/quality/duration/timing/severity/associated sxs/prior treatment) HPI  46 year old female past medical history of GERD, anemia, hemorrhoids, obesity, heart murmur, and chest pain for which she's had at least 4 ED visits in the past 2 years for chest pain, and with a negative  exercise stress test approximately 6 months ago, presenting today with a chief complaint of chest pain. She has had 3 days of constant substernal 3/10 chest pressure with a sharp component is made worse with deep inspiration up to 8/10 in discomfort. When the patient is sleeping he will wake her from sleep with 10/10 sharp pain central chest. She denies any shortness of breath, arm pain jaw pain, he diaphoresis. She denies any paroxysmal nocturnal dyspnea or dyspnea with exertion. He tried taking her Carafate and also aspirin ibuprofen helped the pain. These have not helped. She reports having left leg pain as well for 6 months, and was seen at [facility] for this. According to her, they did a blood test looking for clots, and it was positive, but there was no other workup completed. She calls her symptoms moderate to severe. Her discomfort is for a 4/10 now. Her vital signs are normal on arrival.  Past Medical History  Diagnosis Date  . Anemia   . Anxiety   . GERD (gastroesophageal reflux disease)   . Obesity   . Hemorrhoids   . Anemia   . Fibroids   . Heart murmur     History reviewed. No pertinent past surgical history.  Family History  Problem Relation Age of Onset  . Colon cancer Maternal Uncle   . Breast cancer Maternal Aunt   . Heart disease Maternal Aunt   . Pancreatic cancer Maternal Grandmother   . Cancer Maternal Grandmother     pancreas  . Diabetes Maternal Uncle   . Hypertension  Mother     History  Substance Use Topics  . Smoking status: Never Smoker   . Smokeless tobacco: Never Used  . Alcohol Use: Yes     occasionally    OB History    Grav Para Term Preterm Abortions TAB SAB Ect Mult Living   1 1 1       1       Review of Systems Constitutional: Negative for fever and chills.  HENT: Negative for ear pain, sore throat and trouble swallowing.   Eyes: Negative for pain and visual disturbance.  Respiratory: Negative for cough and shortness of breath.   Cardiovascular: POS for chest pain and leg swelling.  Gastrointestinal: Negative for nausea, vomiting, abdominal pain and diarrhea.  Genitourinary: Negative for dysuria, urgency and frequency.  Musculoskeletal: Negative for back pain and joint swelling.  Skin: Negative for rash and wound.  Neurological: Negative for dizziness, syncope, speech difficulty, weakness and numbness.   Allergies  Food and Flagyl  Home Medications   Current Outpatient Rx  Name Route Sig Dispense Refill  . ASPIRIN 325 MG PO TBEC Oral Take 325 mg by mouth every 4 (four) hours as needed. For arm pain    . ASPIRIN 81 MG PO TABS Oral Take 81 mg by mouth daily.     Marland Kitchen BISMUTH SUBSALICYLATE 262 MG/15ML PO SUSP Oral Take 30 mLs by mouth every 6 (six) hours as needed. For upset  stomach    . FERREX 28 PO TABS Oral Take 1 tablet by mouth daily.    . IBUPROFEN 200 MG PO TABS Oral Take 400 mg by mouth every 6 (six) hours as needed. For arm pain    . ADULT MULTIVITAMIN W/MINERALS CH Oral Take 1 tablet by mouth daily.      BP 123/69  Pulse 63  Temp 99 F (37.2 C) (Oral)  Resp 16  SpO2 98%  Physical Exam Consitutional: Pt in no acute distress.   Head: Normocephalic and atraumatic.  Eyes: Extraocular motion intact, no scleral icterus Neck: Supple without meningismus, mass, or overt JVD Respiratory: Effort normal and breath sounds normal. No respiratory distress. CV: Heart regular rate and regular rhythm (sinus), no obvious  murmurs.  Pulses +2 and symmetric Abdomen: Soft, non-tender, non-distended. No rebound or guarding.  MSK: Extremities are atraumatic without deformity, ROM intact Skin: Warm, dry, intact Neuro: Alert and oriented, no motor deficit noted.   Psychiatric: Mood and affect are normal  EKG:  Rate: 66 Rythym Sinus  Interval  164 ms. Axis: RIGHT No gross conduction abnormalities appreciated.  No gross ST or T-wave abnormalities appreciated.  Essentially unchanged.    ED Course  Procedures (including critical care time)  Labs Reviewed - No data to display No results found.   No diagnosis found.    MDM  Patient with history of chest pain very similar to today's presentation. She is a TIMI 0. Her symptoms are atypical. She has no hypoxia on exam and she does not tachycardic. She has no leg asymmetry. This posterior very low risk for meaningful pulmonary embolus. She has been seen 3 times in the last several months for chest pain according to the patient, and has been seen 3 times last August according to a note from [facility]. At times she had shortness of breath and chest pain with a negative CT angiogram but an elevated d-dimer.  Because she has been worked up before been exposed to considerable radiation for these complaints, and because she has no objective findings whatsoever of PE or DVT today including tachycardia, hypoxia, and leg swelling, and because she has a history of elevated d-dimer, I will not pursue these workups today. D-dimer would likely be positive, making a reflexes CT angiogram necessary.  Again, there are no objective signs of DVT or PE in this patient. I would like to save her exposure to this radiation given her history of multiple presentations with chest pain, sometimes with shortness of breath, but likely will lead to future workups as well.  $$$This patient is PERC negative.$$$  Concerning chest pain: Recent exercise stress test negative. EKG unchanged. This was  seemingly have to be either spasms or plaque rupture generator chest pain assuming it were acute coronary syndrome. Chest pain constant for 3 days, making her an excellent candidate for rule out troponin. Plan will be to treat with GI cocktail given her history of GI complaints that mimic atypical chest pain. Morphine will then be given if not successful.  GI cocktail relieved the patient's symptoms completely. Troponin negative. The workup discussed above. Patient discharged home to followup with primary care.PT DC home stable.  Discussed with pt the clinical impression, treatment in the ED, and follow up plan.  We alslo discussed the indications for returning to the ED, which include shortness or breath, confusion, fever, new weakness or numbness, chest pain, or any other concerning symptom.  The pt understood the treatment and plan, is stable,  and is able to leave the ED.         Larrie Kass, MD 08/22/11 1212  Larrie Kass, MD 08/22/11 1220

## 2011-08-22 NOTE — ED Notes (Signed)
Pt c/o midsternal to right sided CP x 3 days with SOB and nausea

## 2011-08-22 NOTE — ED Provider Notes (Signed)
I saw and evaluated the patient, reviewed the resident's note and I agree with the findings and plan.   I reviewed and agree with resident ECG interpretation.  Pt with somewhat atypical CP, previous work up including stress neg, recently.  No ischemia noted.  Troponin is neg.  Symptoms improved, will d/c home and pt can follow up as outpt   Gavin Pound. Joyce Heitman, MD 08/22/11 1406

## 2011-10-25 ENCOUNTER — Emergency Department (INDEPENDENT_AMBULATORY_CARE_PROVIDER_SITE_OTHER)
Admission: EM | Admit: 2011-10-25 | Discharge: 2011-10-25 | Disposition: A | Discharge status: Home / Self Care | Payer: Self-pay | Source: Home / Self Care | Attending: Family Medicine | Admitting: Family Medicine

## 2011-10-25 ENCOUNTER — Encounter (HOSPITAL_COMMUNITY): Payer: Self-pay

## 2011-10-25 DIAGNOSIS — K047 Periapical abscess without sinus: Secondary | ICD-10-CM

## 2011-10-25 MED ORDER — CLINDAMYCIN HCL 300 MG PO CAPS: 300.0000 mg | ORAL_CAPSULE | Freq: Three times a day (TID) | ORAL | Status: AC

## 2011-10-25 MED ORDER — CLINDAMYCIN HCL 300 MG PO CAPS: 300.0000 mg | ORAL_CAPSULE | Freq: Three times a day (TID) | ORAL | Status: DC

## 2011-10-25 MED ORDER — HYDROCODONE-ACETAMINOPHEN 5-325 MG PO TABS: 2.0000 | ORAL_TABLET | ORAL | Status: AC | PRN

## 2011-10-25 NOTE — ED Provider Notes (Signed)
History     CSN: [id number]  Arrival date & time 10/25/11  1330   First MD Initiated Contact with Patient 10/25/11 1408      Chief Complaint  Patient presents with  . Dental Pain    (Consider location/radiation/quality/duration/timing/severity/associated sxs/prior treatment) Patient is a 46 y.o. female presenting with tooth pain. The history is provided by the patient. No language interpreter was used.  Dental PainThe primary symptoms include mouth pain. The symptoms began more than 1 week ago. The symptoms are worsening. The symptoms are new. The symptoms occur constantly.    Past Medical History  Diagnosis Date  . Anemia   . Anxiety   . GERD (gastroesophageal reflux disease)   . Obesity   . Hemorrhoids   . Anemia   . Fibroids   . Heart murmur     History reviewed. No pertinent past surgical history.  Family History  Problem Relation Age of Onset  . Colon cancer Maternal Uncle   . Breast cancer Maternal Aunt   . Heart disease Maternal Aunt   . Pancreatic cancer Maternal Grandmother   . Cancer Maternal Grandmother     pancreas  . Diabetes Maternal Uncle   . Hypertension Mother     History  Substance Use Topics  . Smoking status: Never Smoker   . Smokeless tobacco: Never Used  . Alcohol Use: Yes     occasionally    OB History    Grav Para Term Preterm Abortions TAB SAB Ect Mult Living   1 1 1       1       Review of Systems  HENT: Positive for mouth sores.   All other systems reviewed and are negative.    Allergies  Food and Flagyl  Home Medications   Current Outpatient Rx  Name Route Sig Dispense Refill  . FERREX 28 PO TABS Oral Take 1 tablet by mouth daily.    . IBUPROFEN 200 MG PO TABS Oral Take 400 mg by mouth every 6 (six) hours as needed. For arm pain    . ASPIRIN 325 MG PO TBEC Oral Take 325 mg by mouth every 4 (four) hours as needed. For arm pain    . ASPIRIN 81 MG PO TABS Oral Take 81 mg by mouth daily.     Marland Kitchen BISMUTH SUBSALICYLATE 262  MG/15ML PO SUSP Oral Take 30 mLs by mouth every 6 (six) hours as needed. For upset stomach    . ADULT MULTIVITAMIN W/MINERALS CH Oral Take 1 tablet by mouth daily.      BP 119/78  Pulse 84  Temp 98.3 F (36.8 C) (Oral)  Resp 16  SpO2 96%  LMP 10/23/2011  Physical Exam  Nursing note and vitals reviewed. Constitutional: She is oriented to person, place, and time. She appears well-developed and well-nourished.  HENT:  Head: Normocephalic and atraumatic.       Abscess left upper gum  Eyes: Pupils are equal, round, and reactive to light.  Neck: Normal range of motion.  Cardiovascular: Normal rate.   Pulmonary/Chest: Effort normal.  Neurological: She is alert and oriented to person, place, and time. She has normal reflexes.  Skin: Skin is warm.  Psychiatric: She has a normal mood and affect.    ED Course  INCISION AND DRAINAGE Date/Time: 10/25/2011 2:49 PM Performed by: Elson Areas Authorized by: Bradd Canary D Consent: Verbal consent obtained. Risks and benefits: risks, benefits and alternatives were discussed Consent given by: patient Patient understanding: patient states  understanding of the procedure being performed Type: abscess Body area: mouth Anesthesia: local infiltration Local anesthetic: lidocaine 2% without epinephrine Scalpel size: 11 Incision type: single straight Drainage: purulent Drainage amount: moderate Wound treatment: wound left open Patient tolerance: Patient tolerated the procedure well with no immediate complications.   (including critical care time)  Labs Reviewed - No data to display No results found.   No diagnosis found.    MDM   Rx for clindamycin and hydrocodone       Lonia Skinner [place], [place] 10/25/11 1451

## 2011-10-25 NOTE — ED Notes (Signed)
C/o dental pain (lt upper) for 1 1/2 weeks.  States seen here for same 2 months ago and did not finish antibiotics due to GI virus.  States this time there is a knot on her gum above the affected tooth.

## 2011-10-26 NOTE — ED Provider Notes (Signed)
Medical screening examination/treatment/procedure(s) were performed by resident physician or non-physician practitioner and as supervising physician I was immediately available for consultation/collaboration.   Addelyn Alleman DOUGLAS MD.    Costella Schwarz D Faatima Tench, MD 10/26/11 1521 

## 2011-12-23 ENCOUNTER — Encounter (HOSPITAL_COMMUNITY): Payer: Self-pay

## 2011-12-23 ENCOUNTER — Emergency Department (INDEPENDENT_AMBULATORY_CARE_PROVIDER_SITE_OTHER)
Admission: EM | Admit: 2011-12-23 | Discharge: 2011-12-23 | Disposition: A | Discharge status: Home / Self Care | Payer: Self-pay | Source: Home / Self Care | Attending: Family Medicine | Admitting: Family Medicine

## 2011-12-23 DIAGNOSIS — K05219 Aggressive periodontitis, localized, unspecified severity: Secondary | ICD-10-CM

## 2011-12-23 DIAGNOSIS — K0520 Aggressive periodontitis, unspecified: Secondary | ICD-10-CM

## 2011-12-23 MED ORDER — CLINDAMYCIN HCL 150 MG PO CAPS: 150.0000 mg | ORAL_CAPSULE | Freq: Four times a day (QID) | ORAL | Status: DC

## 2011-12-23 NOTE — ED Notes (Signed)
Tooth pain x 1 week, same area as last time, this time the abscess opened on its own and she would antibiotic until dental appt can be made

## 2011-12-23 NOTE — ED Provider Notes (Cosign Needed)
History     CSN: [id number]  Arrival date & time 12/23/11  1142   First MD Initiated Contact with Patient 12/23/11 1314      Chief Complaint  Patient presents with  . Dental Pain    (Consider location/radiation/quality/duration/timing/severity/associated sxs/prior treatment) Patient is a 46 y.o. female presenting with tooth pain. The history is provided by the patient.  Dental PainThe primary symptoms include mouth pain and oral lesions. The symptoms began more than 1 month ago (off and on gum/ dental (pain and seen in aug with same problem with i+d performed, swelling recurred but drained last eve.). The symptoms are improving.  Additional symptoms include: gum swelling and gum tenderness.    Past Medical History  Diagnosis Date  . Anemia   . Anxiety   . GERD (gastroesophageal reflux disease)   . Obesity   . Hemorrhoids   . Anemia   . Fibroids   . Heart murmur     History reviewed. No pertinent past surgical history.  Family History  Problem Relation Age of Onset  . Colon cancer Maternal Uncle   . Breast cancer Maternal Aunt   . Heart disease Maternal Aunt   . Pancreatic cancer Maternal Grandmother   . Cancer Maternal Grandmother     pancreas  . Diabetes Maternal Uncle   . Hypertension Mother     History  Substance Use Topics  . Smoking status: Never Smoker   . Smokeless tobacco: Never Used  . Alcohol Use: Yes     occasionally    OB History    Grav Para Term Preterm Abortions TAB SAB Ect Mult Living   1 1 1       1       Review of Systems  Constitutional: Negative.   HENT: Positive for dental problem.     Allergies  Food and Flagyl  Home Medications   Current Outpatient Rx  Name Route Sig Dispense Refill  . ASPIRIN 325 MG PO TBEC Oral Take 325 mg by mouth every 4 (four) hours as needed. For arm pain    . ASPIRIN 81 MG PO TABS Oral Take 81 mg by mouth daily.     Marland Kitchen BISMUTH SUBSALICYLATE 262 MG/15ML PO SUSP Oral Take 30 mLs by mouth every 6  (six) hours as needed. For upset stomach    . FERREX 28 PO TABS Oral Take 1 tablet by mouth daily.    . IBUPROFEN 200 MG PO TABS Oral Take 400 mg by mouth every 6 (six) hours as needed. For arm pain    . ADULT MULTIVITAMIN W/MINERALS CH Oral Take 1 tablet by mouth daily.    Marland Kitchen CLINDAMYCIN HCL 150 MG PO CAPS Oral Take 1 capsule (150 mg total) by mouth 4 (four) times daily. 28 capsule 0    BP 128/85  Pulse 84  Temp 98.3 F (36.8 C) (Oral)  Resp 18  SpO2 100%  LMP 12/19/2011  Physical Exam  Nursing note and vitals reviewed. Constitutional: She appears well-developed and well-nourished.  HENT:  Head: Normocephalic.  Right Ear: External ear normal.  Left Ear: External ear normal.  Mouth/Throat: Oropharynx is clear and moist.      ED Course  Procedures (including critical care time)  Labs Reviewed - No data to display No results found.   1. Gingival abscess       MDM          Linna Hoff, MD 12/23/11 1329

## 2012-03-14 ENCOUNTER — Emergency Department (HOSPITAL_COMMUNITY)
Admission: EM | Admit: 2012-03-14 | Discharge: 2012-03-14 | Disposition: A | Discharge status: Home / Self Care | Payer: Self-pay | Attending: Emergency Medicine | Admitting: Emergency Medicine

## 2012-03-14 ENCOUNTER — Encounter (HOSPITAL_COMMUNITY): Payer: Self-pay

## 2012-03-14 DIAGNOSIS — E669 Obesity, unspecified: Secondary | ICD-10-CM | POA: Insufficient documentation

## 2012-03-14 DIAGNOSIS — R011 Cardiac murmur, unspecified: Secondary | ICD-10-CM | POA: Insufficient documentation

## 2012-03-14 DIAGNOSIS — D1730 Benign lipomatous neoplasm of skin and subcutaneous tissue of unspecified sites: Secondary | ICD-10-CM

## 2012-03-14 DIAGNOSIS — K089 Disorder of teeth and supporting structures, unspecified: Secondary | ICD-10-CM | POA: Insufficient documentation

## 2012-03-14 DIAGNOSIS — N289 Disorder of kidney and ureter, unspecified: Secondary | ICD-10-CM | POA: Insufficient documentation

## 2012-03-14 DIAGNOSIS — Z87448 Personal history of other diseases of urinary system: Secondary | ICD-10-CM | POA: Insufficient documentation

## 2012-03-14 DIAGNOSIS — D649 Anemia, unspecified: Secondary | ICD-10-CM | POA: Insufficient documentation

## 2012-03-14 DIAGNOSIS — Z3202 Encounter for pregnancy test, result negative: Secondary | ICD-10-CM | POA: Insufficient documentation

## 2012-03-14 DIAGNOSIS — Z7982 Long term (current) use of aspirin: Secondary | ICD-10-CM | POA: Insufficient documentation

## 2012-03-14 DIAGNOSIS — D1739 Benign lipomatous neoplasm of skin and subcutaneous tissue of other sites: Secondary | ICD-10-CM | POA: Insufficient documentation

## 2012-03-14 DIAGNOSIS — K0889 Other specified disorders of teeth and supporting structures: Secondary | ICD-10-CM

## 2012-03-14 DIAGNOSIS — Z8719 Personal history of other diseases of the digestive system: Secondary | ICD-10-CM | POA: Insufficient documentation

## 2012-03-14 DIAGNOSIS — Z8679 Personal history of other diseases of the circulatory system: Secondary | ICD-10-CM | POA: Insufficient documentation

## 2012-03-14 DIAGNOSIS — Z8659 Personal history of other mental and behavioral disorders: Secondary | ICD-10-CM | POA: Insufficient documentation

## 2012-03-14 LAB — URINALYSIS, MICROSCOPIC ONLY
Bilirubin Urine: NEGATIVE
Glucose, UA: NEGATIVE mg/dL
Ketones, ur: NEGATIVE mg/dL
Protein, ur: NEGATIVE mg/dL
pH: 5 (ref 5.0–8.0)

## 2012-03-14 LAB — COMPREHENSIVE METABOLIC PANEL
AST: 18 U/L (ref 0–37)
Albumin: 3.6 g/dL (ref 3.5–5.2)
Alkaline Phosphatase: 66 U/L (ref 39–117)
BUN: 12 mg/dL (ref 6–23)
CO2: 23 mEq/L (ref 19–32)
Chloride: 104 mEq/L (ref 96–112)
Creatinine, Ser: 0.86 mg/dL (ref 0.50–1.10)
GFR calc non Af Amer: 80 mL/min — ABNORMAL LOW (ref >90)
Potassium: 3.7 mEq/L (ref 3.5–5.1)
Total Bilirubin: 0.1 mg/dL — ABNORMAL LOW (ref 0.3–1.2)

## 2012-03-14 LAB — CBC WITH DIFFERENTIAL/PLATELET
Basophils Absolute: 0 10*3/uL (ref 0.0–0.1)
Basophils Relative: 0 % (ref 0–1)
HCT: 33.1 % — ABNORMAL LOW (ref 36.0–46.0)
Hemoglobin: 10.4 g/dL — ABNORMAL LOW (ref 12.0–15.0)
Lymphocytes Relative: 30 % (ref 12–46)
MCHC: 31.4 g/dL (ref 30.0–36.0)
Monocytes Absolute: 0.2 10*3/uL (ref 0.1–1.0)
Monocytes Relative: 5 % (ref 3–12)
Neutro Abs: 2.9 10*3/uL (ref 1.7–7.7)
Neutrophils Relative %: 64 % (ref 43–77)
WBC: 4.6 10*3/uL (ref 4.0–10.5)

## 2012-03-14 MED ORDER — TRAMADOL HCL 50 MG PO TABS: 50.0000 mg | ORAL_TABLET | Freq: Four times a day (QID) | ORAL | Status: DC | PRN

## 2012-03-14 MED ORDER — PENICILLIN V POTASSIUM 500 MG PO TABS: 500.0000 mg | ORAL_TABLET | Freq: Three times a day (TID) | ORAL | Status: DC

## 2012-03-14 MED ORDER — PENICILLIN V POTASSIUM 250 MG PO TABS
500.0000 mg | ORAL_TABLET | Freq: Once | ORAL | Status: AC
  Administered 2012-03-14: 500 mg via ORAL
  Filled 2012-03-14: qty 2

## 2012-03-14 NOTE — ED Notes (Signed)
Pt reports upper (L) tooth ache x2 months and LUQ pain starting yesterday at the site of her Lopoma. Pt reports increase pain w/walking and belching, feeling light headed today and some SOB

## 2012-03-14 NOTE — ED Provider Notes (Signed)
History     CSN: [id number]  Arrival date & time 03/14/12  1535   First MD Initiated Contact with Patient 03/14/12 1655      Chief Complaint  Patient presents with  . Abdominal Pain  . Dental Pain    (Consider location/radiation/quality/duration/timing/severity/associated sxs/prior treatment) HPI The patient presents with multiple complaints. Complaint #1 pain in the left upper quadrant.  She is a long history of similar pain in this area, but over the past day the pain has become increasingly severe, sharp, nonradiating.  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  Pain improved with ibuprofen, no clear exacerbating factors.  Complaint #2 the patient complains of lightheadedness, dyspnea.  This began insidiously earlier today.  Since onset symptoms have been persistent, without clear exacerbating or alleviating factors.  No syncope, no cough, no other chest pain, no nausea, no vomiting, no emesis.  Complaint #3 the patient complains of pain focally about the left lateral incisors superiorly.  She has a known EKG changes in this area.  She states over the past days and the pain become more severe.  There is associated headache, slight drainage of pus.  There is no new fever, chills. The patient has not had this evaluated by a dentist yet.  Past Medical History  Diagnosis Date  . Anemia   . Anxiety   . GERD (gastroesophageal reflux disease)   . Obesity   . Hemorrhoids   . Anemia   . Fibroids   . Heart murmur   . Renal disorder     History reviewed. No pertinent past surgical history.  Family History  Problem Relation Age of Onset  . Colon cancer Maternal Uncle   . Breast cancer Maternal Aunt   . Heart disease Maternal Aunt   . Pancreatic cancer Maternal Grandmother   . Cancer Maternal Grandmother     pancreas  . Diabetes Maternal Uncle   . Hypertension Mother     History  Substance Use Topics  . Smoking status: Never Smoker   . Smokeless tobacco: Never Used  .  Alcohol Use: Yes     Comment: occasionally    OB History    Grav Para Term Preterm Abortions TAB SAB Ect Mult Living   1 1 1       1       Review of Systems  Constitutional:       Per HPI, otherwise negative  HENT:       Per HPI, otherwise negative  Eyes: Negative.   Respiratory:       Per HPI, otherwise negative  Cardiovascular:       Per HPI, otherwise negative  Gastrointestinal: Negative for vomiting.  Genitourinary: Negative.   Musculoskeletal:       Per HPI, otherwise negative  Skin: Negative.   Neurological: Negative for syncope.    Allergies  Food; Shellfish allergy; and Flagyl  Home Medications   Current Outpatient Rx  Name  Route  Sig  Dispense  Refill  . ASPIRIN 81 MG PO TABS   Oral   Take 81 mg by mouth daily.          Marland Kitchen BISMUTH SUBSALICYLATE 262 MG/15ML PO SUSP   Oral   Take 30 mLs by mouth every 6 (six) hours as needed. For upset stomach         . FERREX 28 PO TABS   Oral   Take 1 tablet by mouth daily.         Marland Kitchen  IBUPROFEN 200 MG PO TABS   Oral   Take 400 mg by mouth every 6 (six) hours as needed. For arm pain         . ADULT MULTIVITAMIN W/MINERALS CH   Oral   Take 1 tablet by mouth daily.           BP 112/63  Pulse 85  Temp 98.7 F (37.1 C) (Oral)  Resp 16  SpO2 100%  LMP 02/12/2012  Physical Exam  Nursing note and vitals reviewed. Constitutional: She is oriented to person, place, and time. She appears well-developed and well-nourished. No distress.  HENT:  Head: Normocephalic and atraumatic.  Mouth/Throat:    Eyes: Conjunctivae normal and EOM are normal.  Cardiovascular: Normal rate and regular rhythm.   Pulmonary/Chest: Effort normal and breath sounds normal. No stridor. No respiratory distress.  Abdominal: She exhibits no distension.  Musculoskeletal: She exhibits no edema.  Neurological: She is alert and oriented to person, place, and time. No cranial nerve deficit.  Skin: Skin is warm and dry.  Psychiatric:  She has a normal mood and affect.    ED Course  Procedures (including critical care time)  Labs Reviewed  CBC WITH DIFFERENTIAL - Abnormal; Notable for the following:    Hemoglobin 10.4 (*)     HCT 33.1 (*)     All other components within normal limits  COMPREHENSIVE METABOLIC PANEL - Abnormal; Notable for the following:    Total Bilirubin 0.1 (*)     GFR calc non Af Amer 80 (*)     All other components within normal limits  URINALYSIS, MICROSCOPIC ONLY - Abnormal; Notable for the following:    APPearance CLOUDY (*)     All other components within normal limits  LIPASE, BLOOD  POCT PREGNANCY, URINE   No results found.   No diagnosis found.  Pulse ox 90% room air normal    Date: 03/14/2012  Rate: 58  Rhythm: sinus bradycardia and sinus arrhythmia  QRS Axis: normal  Intervals: normal  ST/T Wave abnormalities: normal  Conduction Disutrbances:none  Narrative Interpretation:   Old EKG Reviewed: none available  BORDERLINE  MDM  This generally well-appearing female presents with multiple complaints.  On exam she is medically stable.  There is no evidence of distress.  The patient has slight erythema around a dental lesion, which seems to be the most concerning finding on this evaluation.  Patient has minimal risk factors for CAD, and her pain is likely by the presence of a lipoma focally in the center of her left upper quadrant/chest pain.  The patient's ECG is unremarkable, and she remained in no distress throughout the emergency department stay.  The patient is mildly anemic, which is a known condition for her, there is no gross evidence of infection, and she is stable for discharge with continued evaluation with her primary care physician and a dentist.       Gerhard Munch, MD 03/14/12 1801

## 2012-08-26 ENCOUNTER — Encounter (HOSPITAL_COMMUNITY): Payer: Self-pay

## 2012-08-26 ENCOUNTER — Emergency Department (HOSPITAL_COMMUNITY)
Admission: EM | Admit: 2012-08-26 | Discharge: 2012-08-26 | Disposition: A | Discharge status: Home / Self Care | Payer: Self-pay | Source: Home / Self Care

## 2012-08-26 DIAGNOSIS — R0789 Other chest pain: Secondary | ICD-10-CM

## 2012-08-26 DIAGNOSIS — K089 Disorder of teeth and supporting structures, unspecified: Secondary | ICD-10-CM

## 2012-08-26 DIAGNOSIS — K0889 Other specified disorders of teeth and supporting structures: Secondary | ICD-10-CM

## 2012-08-26 DIAGNOSIS — K219 Gastro-esophageal reflux disease without esophagitis: Secondary | ICD-10-CM

## 2012-08-26 DIAGNOSIS — R071 Chest pain on breathing: Secondary | ICD-10-CM

## 2012-08-26 MED ORDER — PENICILLIN V POTASSIUM 500 MG PO TABS: 500.0000 mg | ORAL_TABLET | Freq: Three times a day (TID) | ORAL | Status: DC

## 2012-08-26 MED ORDER — HYDROCODONE-ACETAMINOPHEN 5-325 MG PO TABS: 1.0000 | ORAL_TABLET | ORAL | Status: DC | PRN

## 2012-08-26 NOTE — ED Provider Notes (Signed)
History    CSN: [id number] Arrival date & time 08/26/12  1723  First MD Initiated Contact with Patient 08/26/12 1859     Chief Complaint  Patient presents with  . Chest Pain   (Consider location/radiation/quality/duration/timing/severity/associated sxs/prior Treatment) HPI Comments: 2 her old female presents with a chief complaint of a toothache. She states she has a metallic taste in her mouth she believes is from drainage. She saw some swelling around the tooth a few days ago but it burst. She states she has an appointment with her dentist next week. Secondary complaint is that of a knot in the chest associated with heartburn and reflux for which she has been diagnosed in the past. She has been taking over-the-counter medicines such as Gaviscon and Pepto-Bismol with transient relief. She also had constipation and successfully treated with magnesium citrate. The pain in the chest is worse with bending over and described as sharp and occasional heavy. No nausea, vomiting, diaphoresis, syncope, palpitations or bleeding.  Past Medical History  Diagnosis Date  . Anemia   . Anxiety   . GERD (gastroesophageal reflux disease)   . Obesity   . Hemorrhoids   . Anemia   . Fibroids   . Heart murmur   . Renal disorder    History reviewed. No pertinent past surgical history. Family History  Problem Relation Age of Onset  . Colon cancer Maternal Uncle   . Breast cancer Maternal Aunt   . Heart disease Maternal Aunt   . Pancreatic cancer Maternal Grandmother   . Cancer Maternal Grandmother     pancreas  . Diabetes Maternal Uncle   . Hypertension Mother    History  Substance Use Topics  . Smoking status: Never Smoker   . Smokeless tobacco: Never Used  . Alcohol Use: Yes     Comment: occasionally   OB History   Grav Para Term Preterm Abortions TAB SAB Ect Mult Living   1 1 1       1      Review of Systems  Constitutional: Negative for fever, diaphoresis, activity change, appetite  change and fatigue.  HENT: Negative.   Respiratory: Negative for cough and shortness of breath.   Cardiovascular: Positive for chest pain. Negative for palpitations and leg swelling.  Gastrointestinal: Positive for constipation. Negative for nausea, vomiting, blood in stool and abdominal distention.  Genitourinary: Negative.   Musculoskeletal: Negative.   Skin: Negative.   Neurological: Negative.     Allergies  Food; Shellfish allergy; and Flagyl  Home Medications   Current Outpatient Rx  Name  Route  Sig  Dispense  Refill  . alum hydroxide-mag trisilicate (GAVISCON) 80-20 MG CHEW   Oral   Chew by mouth.         Marland Kitchen aspirin 81 MG tablet   Oral   Take 81 mg by mouth daily.          Marland Kitchen ibuprofen (ADVIL,MOTRIN) 200 MG tablet   Oral   Take 400 mg by mouth every 6 (six) hours as needed. For arm pain         . bismuth subsalicylate (PEPTO BISMOL) 262 MG/15ML suspension   Oral   Take 30 mLs by mouth every 6 (six) hours as needed. For upset stomach         . FeAspGl-FeFum-B12-FA-C-Succ Ac (FERREX 28) TABS   Oral   Take 1 tablet by mouth daily.         Marland Kitchen HYDROcodone-acetaminophen (NORCO/VICODIN) 5-325 MG per tablet  Oral   Take 1 tablet by mouth every 4 (four) hours as needed for pain.   15 tablet   0   . Multiple Vitamin (MULITIVITAMIN WITH MINERALS) TABS   Oral   Take 1 tablet by mouth daily.         . penicillin v potassium (VEETID) 500 MG tablet   Oral   Take 1 tablet (500 mg total) by mouth 3 (three) times daily.   30 tablet   0   . penicillin v potassium (VEETID) 500 MG tablet   Oral   Take 1 tablet (500 mg total) by mouth 3 (three) times daily.   24 tablet   0   . traMADol (ULTRAM) 50 MG tablet   Oral   Take 1 tablet (50 mg total) by mouth every 6 (six) hours as needed for pain.   15 tablet   0    BP 113/74  Pulse 67  Temp(Src) 99.2 F (37.3 C) (Oral)  Resp 16  SpO2 100% Physical Exam  Nursing note and vitals reviewed. Constitutional:  She is oriented to person, place, and time. She appears well-developed and well-nourished. No distress.  HENT:  Mouth/Throat: Oropharynx is clear and moist.  Left upper lateral incisor with dental tenderness. Minor erythema to the surrounding gingiva. No abscess formation is seen. No drainage is seen.   Eyes: Conjunctivae and EOM are normal.  Neck: Normal range of motion. Neck supple.  Cardiovascular: Normal rate and regular rhythm.   Murmur heard. Pulmonary/Chest: Effort normal and breath sounds normal. No respiratory distress. She has no wheezes. She exhibits tenderness.  Abdominal: Soft. Bowel sounds are normal.  Musculoskeletal: She exhibits no edema and no tenderness.  Lymphadenopathy:    She has no cervical adenopathy.  Neurological: She is alert and oriented to person, place, and time. She exhibits normal muscle tone.  Skin: Skin is warm and dry. No rash noted.    ED Course  Procedures (including critical care time) Labs Reviewed - No data to display No results found. 1. GERD (gastroesophageal reflux disease)   2. Toothache   3. Chest wall pain     MDM  EKG: Normal sinus rhythm, no ischemic changes there are ST depression changes in lead 3 only. No ectopy. Her chest pain has associated with bending over and heartburn and reflux. It is improved with antacids. is also parasternal and upper chest wall tenderness.  Recommend Zantac 150 mg twice a day to control reflux symptoms  Toothache, which is the chief complaint for her visit today Pen-Vee K 500 3 times a day for 8 days  Norco 5 mg every 4 hours when necessary pain #15.  Keep appointment with your dentist next week as scheduled.   Hayden Rasmussen, NP 08/26/12 1950

## 2012-08-26 NOTE — ED Notes (Signed)
History of GERD. States he has been having discomfort since Tuesday , epigastric area , w/o radiation /n/v/diaphoresis. C/o the sensation wakes her at night. Also concerned about bad tooth that makes her have a bad taste in her mouth ( reportedly ha san appointment next week for an extraction) NAD

## 2012-09-08 NOTE — ED Provider Notes (Signed)
Medical screening examination/treatment/procedure(s) were performed by resident physician or non-physician practitioner and as supervising physician I was immediately available for consultation/collaboration.   Barkley Bruns MD.   Linna Hoff, MD 09/08/12 [phone]

## 2012-12-17 ENCOUNTER — Emergency Department (INDEPENDENT_AMBULATORY_CARE_PROVIDER_SITE_OTHER)
Admission: EM | Admit: 2012-12-17 | Discharge: 2012-12-17 | Disposition: A | Discharge status: Home / Self Care | Payer: Self-pay | Source: Home / Self Care | Attending: Emergency Medicine | Admitting: Emergency Medicine

## 2012-12-17 ENCOUNTER — Encounter (HOSPITAL_COMMUNITY): Payer: Self-pay | Admitting: Emergency Medicine

## 2012-12-17 DIAGNOSIS — K0401 Reversible pulpitis: Secondary | ICD-10-CM

## 2012-12-17 MED ORDER — OXYCODONE-ACETAMINOPHEN 5-325 MG PO TABS: ORAL_TABLET | ORAL | Status: DC

## 2012-12-17 MED ORDER — NAPROXEN 500 MG PO TABS: 500.0000 mg | ORAL_TABLET | Freq: Two times a day (BID) | ORAL | Status: DC

## 2012-12-17 MED ORDER — PENICILLIN V POTASSIUM 500 MG PO TABS: 500.0000 mg | ORAL_TABLET | Freq: Four times a day (QID) | ORAL | Status: DC

## 2012-12-17 NOTE — ED Provider Notes (Signed)
Chief Complaint:   Chief Complaint  Patient presents with  . Dental Pain    History of Present Illness:   Xayla Blumer is a 47-year-old female had a two-week history of a toothache of her right, upper, first molar. She broke a piece of this molar several weeks ago. It's gotten infected since then and is painful. She can occasionally taste a purulent taste in her mouth. She sometimes feels warm. She notes pain radiating towards the eye. She denies any difficulty swallowing or breathing. It hurts to chew on that side. She's had no fever or chills, no headaches. No ear pain, neck pain or swelling, shortness of breath or chest.  Review of Systems:  Other than noted above, the patient denies any of the following symptoms: Systemic:  No fever, chills,  Or sweats. ENT:  No headache, ear ache, sore throat, nasal congestion, facial pain, or swelling. Lymphatic:  No adenopathy. Lungs:  No coughing, wheezing or shortness of breath.  PMFSH:  Past medical history, family history, social history, meds, and allergies were reviewed. She has fibroid tumors.  Physical Exam:   Vital signs:  BP 133/79  Pulse 73  Temp(Src) 98.4 F (36.9 C) (Oral)  Resp 18  SpO2 100%  LMP 11/28/2012 General:  Alert, oriented, in no distress. ENT:  TMs and canals normal.  Nasal mucosa normal. Mouth exam:  Her teeth are overall good repair with area number of missing teeth. The right, upper, first molar has part of the filling missing. There is no swelling of the gingiva. No swelling of the floor the mouth or the tongue. The pharynx is clear and the airway is widely patent. Neck:  No swelling or adenopathy. Lungs:  Breath sounds clear and equal bilaterally.  No wheezes, rales or rhonchi. Heart:  Regular rhythm.  No gallops or murmers. Skin:  Clear, warm and dry.   Assessment:  The encounter diagnosis was Pulpitis.  Plan:   1.  Meds:  The following meds were prescribed:   Discharge Medication List as of 12/17/2012  6:42  PM    START taking these medications   Details  naproxen (NAPROSYN) 500 MG tablet Take 1 tablet (500 mg total) by mouth 2 (two) times daily., Starting 12/17/2012, Until Discontinued, Normal    oxyCODONE-acetaminophen (PERCOCET) 5-325 MG per tablet 1 to 2 tablets every 6 hours as needed for pain., Print    !! penicillin v potassium (VEETID) 500 MG tablet Take 1 tablet (500 mg total) by mouth 4 (four) times daily., Starting 12/17/2012, Until Discontinued, Normal     !! - Potential duplicate medications found. Please discuss with provider.      2.  Patient Education/Counseling:  The patient was given appropriate handouts, self care instructions, and instructed in symptomatic relief. Suggested sleeping with head of bed elevated and hot salt water mouthwash.  3.  Follow up:  The patient was told to follow up if no better in 3 to 4 days, if becoming worse in any way, and given some red flag symptoms such as difficulty swallowing or breathing which would prompt immediate return.  Follow up with a dentist as soon as posssible.     Reuben Likes, MD 12/17/12 2019

## 2012-12-17 NOTE — ED Notes (Signed)
States R upper jaw molar broke 1 month ago.  Started hurting 2 weeks ago. States she does not have dental insurance and can't afford to go to the dentist until next month.

## 2013-02-08 ENCOUNTER — Emergency Department (HOSPITAL_COMMUNITY)
Admission: EM | Admit: 2013-02-08 | Discharge: 2013-02-08 | Disposition: A | Discharge status: Home / Self Care | Payer: Self-pay | Attending: Emergency Medicine | Admitting: Emergency Medicine

## 2013-02-08 ENCOUNTER — Emergency Department (HOSPITAL_COMMUNITY): Payer: Self-pay

## 2013-02-08 ENCOUNTER — Encounter (HOSPITAL_COMMUNITY): Payer: Self-pay | Admitting: Emergency Medicine

## 2013-02-08 DIAGNOSIS — R197 Diarrhea, unspecified: Secondary | ICD-10-CM | POA: Insufficient documentation

## 2013-02-08 DIAGNOSIS — E669 Obesity, unspecified: Secondary | ICD-10-CM | POA: Insufficient documentation

## 2013-02-08 DIAGNOSIS — Z8719 Personal history of other diseases of the digestive system: Secondary | ICD-10-CM | POA: Insufficient documentation

## 2013-02-08 DIAGNOSIS — Z7982 Long term (current) use of aspirin: Secondary | ICD-10-CM | POA: Insufficient documentation

## 2013-02-08 DIAGNOSIS — D649 Anemia, unspecified: Secondary | ICD-10-CM | POA: Insufficient documentation

## 2013-02-08 DIAGNOSIS — Z8659 Personal history of other mental and behavioral disorders: Secondary | ICD-10-CM | POA: Insufficient documentation

## 2013-02-08 DIAGNOSIS — R112 Nausea with vomiting, unspecified: Secondary | ICD-10-CM | POA: Insufficient documentation

## 2013-02-08 DIAGNOSIS — R0789 Other chest pain: Secondary | ICD-10-CM | POA: Diagnosis present

## 2013-02-08 DIAGNOSIS — Z79899 Other long term (current) drug therapy: Secondary | ICD-10-CM | POA: Insufficient documentation

## 2013-02-08 DIAGNOSIS — Z8679 Personal history of other diseases of the circulatory system: Secondary | ICD-10-CM | POA: Insufficient documentation

## 2013-02-08 DIAGNOSIS — Z8742 Personal history of other diseases of the female genital tract: Secondary | ICD-10-CM | POA: Insufficient documentation

## 2013-02-08 DIAGNOSIS — R011 Cardiac murmur, unspecified: Secondary | ICD-10-CM | POA: Insufficient documentation

## 2013-02-08 LAB — CBC
HCT: 28.6 % — ABNORMAL LOW (ref 36.0–46.0)
Hemoglobin: 9.1 g/dL — ABNORMAL LOW (ref 12.0–15.0)
MCV: 75.9 fL — ABNORMAL LOW (ref 78.0–100.0)
RDW: 17.9 % — ABNORMAL HIGH (ref 11.5–15.5)
WBC: 5.1 10*3/uL (ref 4.0–10.5)

## 2013-02-08 LAB — BASIC METABOLIC PANEL
BUN: 12 mg/dL (ref 6–23)
CO2: 24 mEq/L (ref 19–32)
Chloride: 103 mEq/L (ref 96–112)
Creatinine, Ser: 0.82 mg/dL (ref 0.50–1.10)
Glucose, Bld: 98 mg/dL (ref 70–99)

## 2013-02-08 MED ORDER — GI COCKTAIL ~~LOC~~
30.0000 mL | Freq: Once | ORAL | Status: AC
  Administered 2013-02-08: 30 mL via ORAL
  Filled 2013-02-08: qty 30

## 2013-02-08 NOTE — ED Notes (Signed)
Pt states for past 4 days she has had SOB intermit with activity and centralized chest pain. Pt states she vomited x1 large amount 4 days ago. Denies abdominal discomfort. Pt has hx of GERD but states this feels different.

## 2013-02-08 NOTE — ED Provider Notes (Signed)
CSN: [id number]     Arrival date & time 02/08/13  1927 History   First MD Initiated Contact with Patient 02/08/13 2039     Chief Complaint  Patient presents with  . Chest Pain   (Consider location/radiation/quality/duration/timing/severity/associated sxs/prior Treatment) Patient is a 47 y.o. female presenting with chest pain. The history is provided by the patient.  Chest Pain Pain quality: aching and dull   Pain radiates to:  Does not radiate Pain radiates to the back: no   Pain severity:  Mild Onset quality:  Gradual Duration:  4 days Timing:  Intermittent Progression:  Unchanged Chronicity:  Recurrent Context: at rest   Relieved by:  Nothing Worsened by:  Nothing tried Ineffective treatments:  None tried Associated symptoms: nausea and vomiting   Associated symptoms: no abdominal pain, no back pain, no cough, no dizziness, no fatigue, no fever, no headache and no shortness of breath   Risk factors: no smoking     Past Medical History  Diagnosis Date  . Anemia   . Anxiety   . GERD (gastroesophageal reflux disease)   . Obesity   . Hemorrhoids   . Anemia   . Fibroids   . Heart murmur    History reviewed. No pertinent past surgical history. Family History  Problem Relation Age of Onset  . Colon cancer Maternal Uncle   . Breast cancer Maternal Aunt   . Heart disease Maternal Aunt   . Pancreatic cancer Maternal Grandmother   . Cancer Maternal Grandmother     pancreas  . Diabetes Maternal Uncle   . Hypertension Mother   . Renal Disease Mother    History  Substance Use Topics  . Smoking status: Never Smoker   . Smokeless tobacco: Never Used  . Alcohol Use: Yes     Comment: occasionally   OB History   Grav Para Term Preterm Abortions TAB SAB Ect Mult Living   1 1 1       1      Review of Systems  Constitutional: Negative for fever and fatigue.  HENT: Negative for congestion and drooling.   Eyes: Negative for pain.  Respiratory: Negative for cough and  shortness of breath.   Cardiovascular: Positive for chest pain.  Gastrointestinal: Positive for nausea, vomiting and diarrhea. Negative for abdominal pain.  Genitourinary: Negative for dysuria and hematuria.  Musculoskeletal: Negative for back pain, gait problem and neck pain.  Skin: Negative for color change.  Neurological: Negative for dizziness and headaches.  Hematological: Negative for adenopathy.  Psychiatric/Behavioral: Negative for behavioral problems.  All other systems reviewed and are negative.    Allergies  Food; Shellfish allergy; and Flagyl  Home Medications   Current Outpatient Rx  Name  Route  Sig  Dispense  Refill  . aspirin 325 MG tablet   Oral   Take 650 mg by mouth every 6 (six) hours as needed for pain.         Marland Kitchen bismuth subsalicylate (PEPTO BISMOL) 262 MG/15ML suspension   Oral   Take 30 mLs by mouth every 6 (six) hours as needed. For upset stomach         . Cholecalciferol (VITAMIN D) 2000 UNITS CAPS   Oral   Take 2,000 Units by mouth 3 (three) times daily.         . ferrous sulfate 325 (65 FE) MG tablet   Oral   Take 325 mg by mouth daily with breakfast.         . ibuprofen (  ADVIL,MOTRIN) 200 MG tablet   Oral   Take 400 mg by mouth every 6 (six) hours as needed. For arm pain         . Multiple Vitamin (MULITIVITAMIN WITH MINERALS) TABS   Oral   Take 1 tablet by mouth daily.          LMP 01/18/2013 Physical Exam  Nursing note and vitals reviewed. Constitutional: She is oriented to person, place, and time. She appears well-developed and well-nourished.  HENT:  Head: Normocephalic.  Mouth/Throat: Oropharynx is clear and moist. No oropharyngeal exudate.  Eyes: Conjunctivae and EOM are normal. Pupils are equal, round, and reactive to light.  Neck: Normal range of motion. Neck supple.  Cardiovascular: Normal rate, regular rhythm, normal heart sounds and intact distal pulses.  Exam reveals no gallop and no friction rub.   No murmur  heard. Pulmonary/Chest: Effort normal and breath sounds normal. No respiratory distress. She has no wheezes.  Abdominal: Soft. Bowel sounds are normal. There is no tenderness. There is no rebound and no guarding.  Musculoskeletal: Normal range of motion. She exhibits no edema and no tenderness.  Neurological: She is alert and oriented to person, place, and time.  Skin: Skin is warm and dry.  Psychiatric: She has a normal mood and affect. Her behavior is normal.    ED Course  Procedures (including critical care time) Labs Review Labs Reviewed  CBC - Abnormal; Notable for the following:    RBC 3.77 (*)    Hemoglobin 9.1 (*)    HCT 28.6 (*)    MCV 75.9 (*)    MCH 24.1 (*)    RDW 17.9 (*)    All other components within normal limits  BASIC METABOLIC PANEL - Abnormal; Notable for the following:    GFR calc non Af Amer 85 (*)    All other components within normal limits  PRO B NATRIURETIC PEPTIDE  POCT I-STAT TROPONIN I   Imaging Review Dg Chest 2 View  02/08/2013   CLINICAL DATA:  Chest pain.  Fever.  Dizziness.  EXAM: CHEST  2 VIEW  COMPARISON:  08/22/2011.  FINDINGS: Cardiopericardial silhouette within normal limits. Mediastinal contours normal. Trachea midline. No airspace disease or effusion.  IMPRESSION: No active cardiopulmonary disease.   Electronically Signed   By: Andreas Newport M.D.   On: 02/08/2013 20:27    EKG Interpretation    Date/Time:  Monday February 08 2013 19:34:58 EST Ventricular Rate:  87 PR Interval:  170 QRS Duration: 76 QT Interval:  366 QTC Calculation: 440 R Axis:   87 Text Interpretation:  Normal sinus rhythm Normal ECG non-specifc t wave changes No significant change since last tracing Confirmed by Laelyn Blumenthal  MD, Ashleynicole Mcclees ([id number]) on 02/08/2013 9:40:16 PM            MDM   1. Atypical chest pain    9:37 PM 47 y.o. female who presents with intermittent nausea, vomiting, and dull aching chest pain for the last 3-4 days. She is also had mild  intermittent diarrhea. The pain can occur at rest or during exertion. She developed pain this evening while sitting in class which has persisted for 2 hours. She is afebrile and vital signs are unremarkable here. Wells/perc neg. RF's include FH. Will get screening labwork and GI cocktail as pt notes she has had similar sx w/ GERD.   11:04 PM: Labs/imaging non-contrib. Pt now asx after gi cocktail. I suspect reflux as the cause of her sx.  Low risk for  MACE per HEART score. Pt has zantac but is not currently taking it. Will have her restart the zantac. She states she had a neg stress test 2 years ago.  I have discussed the diagnosis/risks/treatment options with the patient and believe the pt to be eligible for discharge home to follow-up with her pcp to discuss possible repeat stress testing. We also discussed returning to the ED immediately if new or worsening sx occur. We discussed the sx which are most concerning (e.g., return of pain, sob, fever) that necessitate immediate return. Any new prescriptions provided to the patient are listed below.  Discharge Medication List as of 02/08/2013 11:05 PM       Randa Spike Mort Sawyers, MD 02/09/13 1357

## 2013-02-08 NOTE — ED Notes (Signed)
Pt. reports intermittent mid chest pain with slight SOB , nausea and vomitting for 1 week .

## 2013-02-19 ENCOUNTER — Emergency Department (HOSPITAL_COMMUNITY)
Admission: EM | Admit: 2013-02-19 | Discharge: 2013-02-19 | Disposition: A | Discharge status: Home / Self Care | Payer: Self-pay | Attending: Emergency Medicine | Admitting: Emergency Medicine

## 2013-02-19 ENCOUNTER — Encounter (HOSPITAL_COMMUNITY): Payer: Self-pay | Admitting: Emergency Medicine

## 2013-02-19 DIAGNOSIS — Z8659 Personal history of other mental and behavioral disorders: Secondary | ICD-10-CM | POA: Insufficient documentation

## 2013-02-19 DIAGNOSIS — D649 Anemia, unspecified: Secondary | ICD-10-CM | POA: Insufficient documentation

## 2013-02-19 DIAGNOSIS — K219 Gastro-esophageal reflux disease without esophagitis: Secondary | ICD-10-CM | POA: Insufficient documentation

## 2013-02-19 DIAGNOSIS — Z79899 Other long term (current) drug therapy: Secondary | ICD-10-CM | POA: Insufficient documentation

## 2013-02-19 DIAGNOSIS — E669 Obesity, unspecified: Secondary | ICD-10-CM | POA: Insufficient documentation

## 2013-02-19 DIAGNOSIS — R011 Cardiac murmur, unspecified: Secondary | ICD-10-CM | POA: Insufficient documentation

## 2013-02-19 DIAGNOSIS — Z8742 Personal history of other diseases of the female genital tract: Secondary | ICD-10-CM | POA: Insufficient documentation

## 2013-02-19 DIAGNOSIS — K089 Disorder of teeth and supporting structures, unspecified: Secondary | ICD-10-CM | POA: Insufficient documentation

## 2013-02-19 DIAGNOSIS — Z7982 Long term (current) use of aspirin: Secondary | ICD-10-CM | POA: Insufficient documentation

## 2013-02-19 DIAGNOSIS — Z8679 Personal history of other diseases of the circulatory system: Secondary | ICD-10-CM | POA: Insufficient documentation

## 2013-02-19 DIAGNOSIS — K0889 Other specified disorders of teeth and supporting structures: Secondary | ICD-10-CM

## 2013-02-19 MED ORDER — OXYCODONE-ACETAMINOPHEN 5-325 MG PO TABS: 1.0000 | ORAL_TABLET | ORAL | Status: DC | PRN

## 2013-02-19 MED ORDER — PENICILLIN V POTASSIUM 500 MG PO TABS: 500.0000 mg | ORAL_TABLET | Freq: Four times a day (QID) | ORAL | Status: DC

## 2013-02-19 NOTE — ED Provider Notes (Signed)
CSN: [id number]     Arrival date & time 02/19/13  1819 History   First MD Initiated Contact with Patient 02/19/13 1851     Chief Complaint  Patient presents with  . Dental Pain   (Consider location/radiation/quality/duration/timing/severity/associated sxs/prior Treatment) Patient is a 47 y.o. female presenting with tooth pain.  Dental Pain Associated symptoms: no congestion and no neck pain    47 yo female presents with RIGHT sided dental pain x 2 months following breaking of tooth. Patient states pain considerably worse last night. Pain is currently 6/10 described as throbbing and sharp, with radiation to the head.Pain improved with oragel, cool washcloth, and sometimes with chewing. Pain worse with salty foods. Patient denies fever/chills, no chest pain, or dyspnea. Denies HA or dizziness.   Past Medical History  Diagnosis Date  . Anemia   . Anxiety   . GERD (gastroesophageal reflux disease)   . Obesity   . Hemorrhoids   . Anemia   . Fibroids   . Heart murmur    History reviewed. No pertinent past surgical history. Family History  Problem Relation Age of Onset  . Colon cancer Maternal Uncle   . Breast cancer Maternal Aunt   . Heart disease Maternal Aunt   . Pancreatic cancer Maternal Grandmother   . Cancer Maternal Grandmother     pancreas  . Diabetes Maternal Uncle   . Hypertension Mother   . Renal Disease Mother    History  Substance Use Topics  . Smoking status: Never Smoker   . Smokeless tobacco: Never Used  . Alcohol Use: Yes     Comment: occasionally   OB History   Grav Para Term Preterm Abortions TAB SAB Ect Mult Living   1 1 1       1      Review of Systems  HENT: Negative for congestion, rhinorrhea and sinus pressure.   Respiratory: Negative for shortness of breath.   Cardiovascular: Negative for chest pain.  Gastrointestinal: Negative for nausea and abdominal pain.  Musculoskeletal: Negative for neck pain and neck stiffness.  Skin: Negative for rash.     Allergies  Food; Shellfish allergy; and Flagyl  Home Medications   Current Outpatient Rx  Name  Route  Sig  Dispense  Refill  . acetaminophen (TYLENOL) 500 MG tablet   Oral   Take 500-1,000 mg by mouth every 6 (six) hours as needed for mild pain.         Marland Kitchen aspirin 325 MG tablet   Oral   Take 650 mg by mouth every 6 (six) hours as needed for mild pain.          Marland Kitchen bismuth subsalicylate (PEPTO BISMOL) 262 MG/15ML suspension   Oral   Take 30 mLs by mouth every 6 (six) hours as needed. For upset stomach         . Cholecalciferol (VITAMIN D) 2000 UNITS CAPS   Oral   Take 2,000 Units by mouth daily.          . ferrous sulfate 325 (65 FE) MG tablet   Oral   Take 325 mg by mouth daily.          Marland Kitchen ibuprofen (ADVIL,MOTRIN) 800 MG tablet   Oral   Take 800 mg by mouth 2 (two) times daily as needed for moderate pain.         . ranitidine (ZANTAC) 75 MG tablet   Oral   Take 75 mg by mouth daily as needed for heartburn.         Marland Kitchen  oxyCODONE-acetaminophen (PERCOCET) 5-325 MG per tablet   Oral   Take 1 tablet by mouth every 4 (four) hours as needed.   15 tablet   0   . penicillin v potassium (VEETID) 500 MG tablet   Oral   Take 1 tablet (500 mg total) by mouth 4 (four) times daily. X 7 days   40 tablet   0    BP 123/71  Pulse 75  Temp(Src) 98.6 F (37 C) (Oral)  Resp 16  SpO2 100%  LMP 01/25/2013 Physical Exam  Nursing note and vitals reviewed. Constitutional: She is oriented to person, place, and time. She appears well-developed and well-nourished. No distress.  HENT:  Head: Normocephalic and atraumatic.  Right Ear: Tympanic membrane and ear canal normal.  Left Ear: Tympanic membrane and ear canal normal.  Nose: Nose normal.  Mouth/Throat: Uvula is midline, oropharynx is clear and moist and mucous membranes are normal.    Neck: Normal range of motion. Neck supple.  Cardiovascular: Normal rate and regular rhythm.  Exam reveals no gallop and no  friction rub.   Murmur (Systolic murmur heard best at Left sternal border. Grade 4/6) heard. Pulmonary/Chest: Effort normal and breath sounds normal. No respiratory distress. She has no wheezes. She has no rales.  Musculoskeletal: Normal range of motion. She exhibits no edema.  Neurological: She is alert and oriented to person, place, and time.  Skin: Skin is warm and dry. She is not diaphoretic.  Psychiatric: She has a normal mood and affect. Her behavior is normal.    ED Course  Procedures (including critical care time) Labs Review Labs Reviewed - No data to display Imaging Review No results found.  EKG Interpretation   None       MDM   1. Tooth pain    Patient given rx for antibiotic and pain medicine. Advised follow up with Dentist. Given resource guide and on-call Dentist contact information. Advised patient to call today. Patient agrees with plan. Patient discharged in good condition.   Meds given in ED:  Medications - No data to display  Discharge Medication List as of 02/19/2013  7:59 PM    START taking these medications   Details  oxyCODONE-acetaminophen (PERCOCET) 5-325 MG per tablet Take 1 tablet by mouth every 4 (four) hours as needed., Starting 02/19/2013, Until Discontinued, Print    penicillin v potassium (VEETID) 500 MG tablet Take 1 tablet (500 mg total) by mouth 4 (four) times daily. X 7 days, Starting 02/19/2013, Until Discontinued, Print         Rudene Anda, [place] 02/20/13 0210

## 2013-02-19 NOTE — ED Notes (Signed)
Pt c/o intermittent Right mouth pain due to a broken tooth to the upper Right jaw x2 weeks with intense pain last night

## 2013-02-21 NOTE — ED Provider Notes (Signed)
Medical screening examination/treatment/procedure(s) were performed by non-physician practitioner and as supervising physician I was immediately available for consultation/collaboration.  EKG Interpretation   None         Adriano Bischof N Andriel Omalley, DO 02/21/13 1352 

## 2013-05-28 ENCOUNTER — Encounter (HOSPITAL_COMMUNITY): Payer: Self-pay | Admitting: Emergency Medicine

## 2013-05-28 ENCOUNTER — Emergency Department (INDEPENDENT_AMBULATORY_CARE_PROVIDER_SITE_OTHER)
Admission: EM | Admit: 2013-05-28 | Discharge: 2013-05-28 | Disposition: A | Discharge status: Home / Self Care | Payer: Self-pay | Source: Home / Self Care | Attending: Family Medicine | Admitting: Family Medicine

## 2013-05-28 DIAGNOSIS — IMO0002 Reserved for concepts with insufficient information to code with codable children: Secondary | ICD-10-CM

## 2013-05-28 DIAGNOSIS — S0081XA Abrasion of other part of head, initial encounter: Secondary | ICD-10-CM

## 2013-05-28 DIAGNOSIS — S0083XA Contusion of other part of head, initial encounter: Secondary | ICD-10-CM

## 2013-05-28 DIAGNOSIS — S1093XA Contusion of unspecified part of neck, initial encounter: Secondary | ICD-10-CM

## 2013-05-28 DIAGNOSIS — S0003XA Contusion of scalp, initial encounter: Secondary | ICD-10-CM

## 2013-05-28 NOTE — ED Notes (Signed)
Pt reports laceration to forehead/above left eye brown onset 0900 Reports she hit her head against car door Laceration is superficial; bleeding controlled Denies LOC Alert w/no signs of acute distress.

## 2013-05-28 NOTE — ED Provider Notes (Signed)
Theresa Peterson is a 48 y.o. female who presents to Urgent Care today for for head contusion and abrasion. Patient accidentally hit her head against her car door today while getting back in the car after filling her gasping.. She noted his small cut on her forehead and presents today for evaluation and management. She denies any loss of consciousness headache weakness or numbness dizziness or confusion. She feels well otherwise. No medications tried. Tetanus vaccination was given last 2 years ago.   Past Medical History  Diagnosis Date  . Anemia   . Anxiety   . GERD (gastroesophageal reflux disease)   . Obesity   . Hemorrhoids   . Anemia   . Fibroids   . Heart murmur    History  Substance Use Topics  . Smoking status: Never Smoker   . Smokeless tobacco: Never Used  . Alcohol Use: Yes     Comment: occasionally   ROS as above Medications: No current facility-administered medications for this encounter.   Current Outpatient Prescriptions  Medication Sig Dispense Refill  . aspirin 325 MG tablet Take 650 mg by mouth every 6 (six) hours as needed for mild pain.       Marland Kitchen ibuprofen (ADVIL,MOTRIN) 800 MG tablet Take 800 mg by mouth 2 (two) times daily as needed for moderate pain.      Marland Kitchen acetaminophen (TYLENOL) 500 MG tablet Take 500-1,000 mg by mouth every 6 (six) hours as needed for mild pain.      Marland Kitchen bismuth subsalicylate (PEPTO BISMOL) 262 MG/15ML suspension Take 30 mLs by mouth every 6 (six) hours as needed. For upset stomach      . Cholecalciferol (VITAMIN D) 2000 UNITS CAPS Take 2,000 Units by mouth daily.       . ferrous sulfate 325 (65 FE) MG tablet Take 325 mg by mouth daily.       Marland Kitchen oxyCODONE-acetaminophen (PERCOCET) 5-325 MG per tablet Take 1 tablet by mouth every 4 (four) hours as needed.  15 tablet  0  . penicillin v potassium (VEETID) 500 MG tablet Take 1 tablet (500 mg total) by mouth 4 (four) times daily. X 7 days  40 tablet  0  . ranitidine (ZANTAC) 75 MG tablet Take 75  mg by mouth daily as needed for heartburn.        Exam:  BP 126/78  Pulse 69  Temp(Src) 98 F (36.7 C) (Oral)  Resp 18  SpO2 100%  LMP 05/17/2013 Gen: Well NAD Skin: Small contusion with superficial 1 cm linear thin abrasion of the left forehead. The abrasion does not extend through the dermis. Neuro: Alert and oriented normally conversant normal balance and gait. Moves all extremities normally.  Assessment and Plan: 48 y.o. female with abrasion and contusion of the left forehead. Antibiotic ointment. Tetanus up-to-date. Watchful waiting. Followup as needed.  Discussed warning signs or symptoms. Please see discharge instructions. Patient expresses understanding.    Gregor Hams, MD 05/28/13 [phone]

## 2013-05-28 NOTE — Discharge Instructions (Signed)
Thank you for coming in today. Keep the wound covered with ointment.  Come back as needed Go to the emergency room if your headache becomes excruciating or you have weakness or numbness or uncontrolled vomiting.   SEEK IMMEDIATE MEDICAL CARE IF:   You have severe or worsening headaches. These may be a sign of a blood clot in the brain.  You have weakness (even if only in one hand, leg, or part of the face).  You have numbness.  You have decreased coordination.   You vomit repeatedly.  You have increased sleepiness.  One pupil is larger than the other.   You have convulsions.   You have slurred speech.   You have increased confusion. This may be a sign of a blood clot in the brain.  You have increased restlessness, agitation, or irritability.   You are unable to recognize people or places.   You have neck pain.   It is difficult to wake you up.   You have unusual behavior changes.   You lose consciousness.

## 2013-08-09 IMAGING — CT CT ANGIO CHEST
2 of 6 series · 19 of 46 positions shown · IV contrast (APPLIED)
Comparison: 10/26/2010.

CLINICAL DATA: Short of breath.  Chest pain radiating to left
chest and back.  Elevated D-dimer.

CT ANGIOGRAPHY CHEST WITH CONTRAST
TECHNIQUE: Multidetector CT imaging of the chest was performed
using the standard protocol during bolus administration of
intravenous contrast.  Multiplanar CT image reconstructions
including MIPs were obtained to evaluate the vascular anatomy.
Contrast:  100 ml [id number].

[Series 10: pulm embolism 1.0 b25f thin · axial · 0.60mm/px · z∈[-228,-24]mm · 16 of 224 slices shown]
[im 10/224  lung]
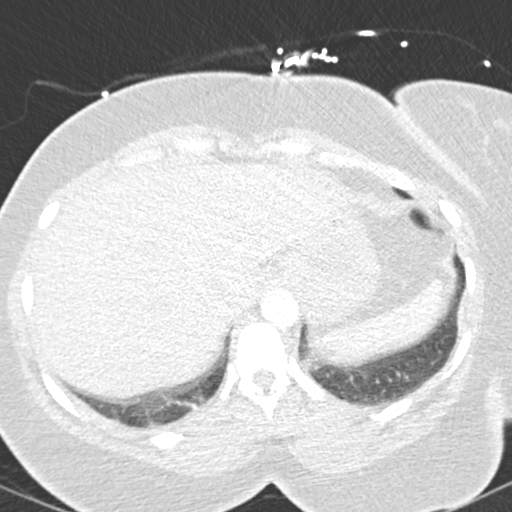
[im 30/224  soft-tissue]
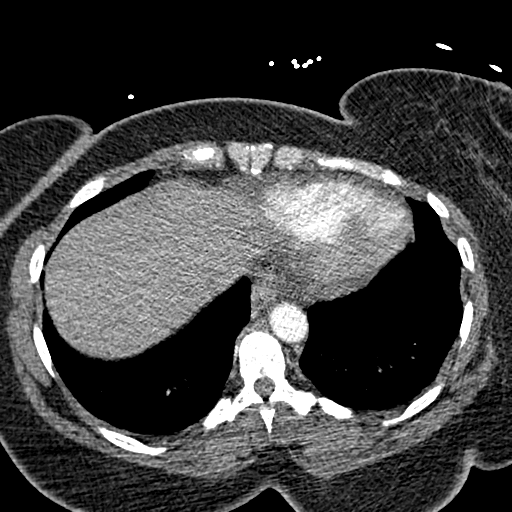
[im 39/224  lung]
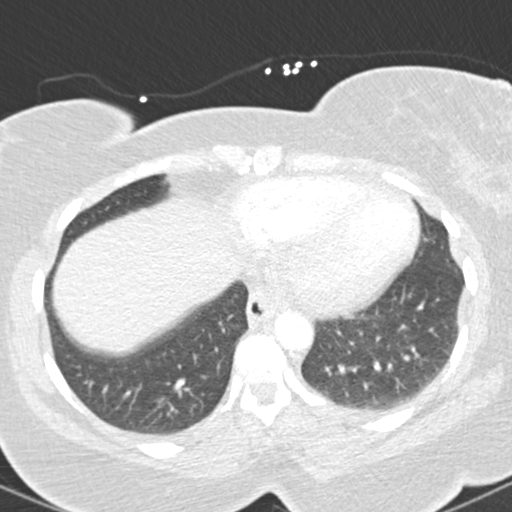
[im 49/224  soft-tissue]
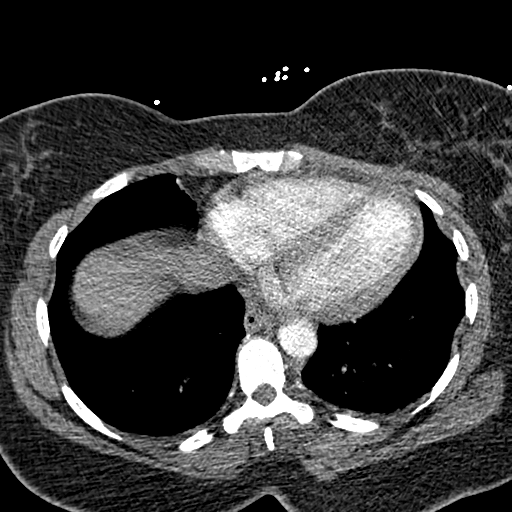
[im 68/224  lung]
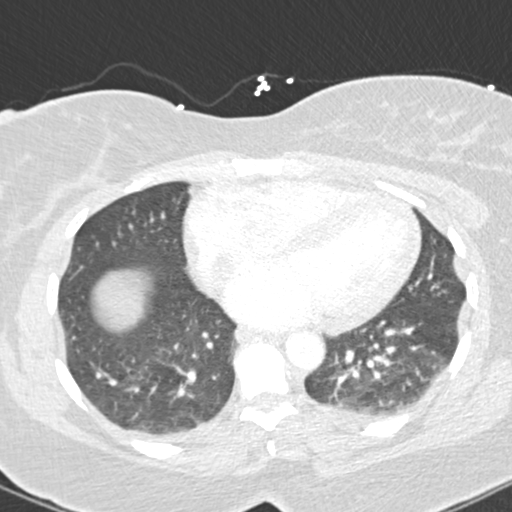
[im 78/224  soft-tissue]
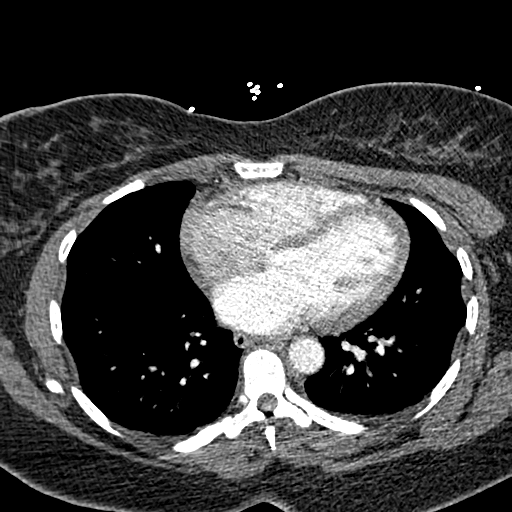
[im 88/224  lung]
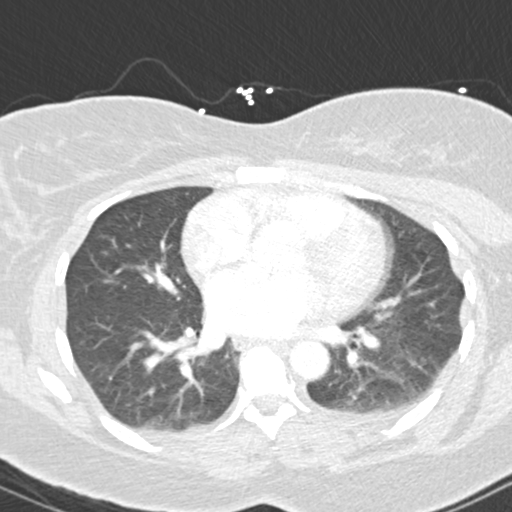
[im 107/224  soft-tissue]
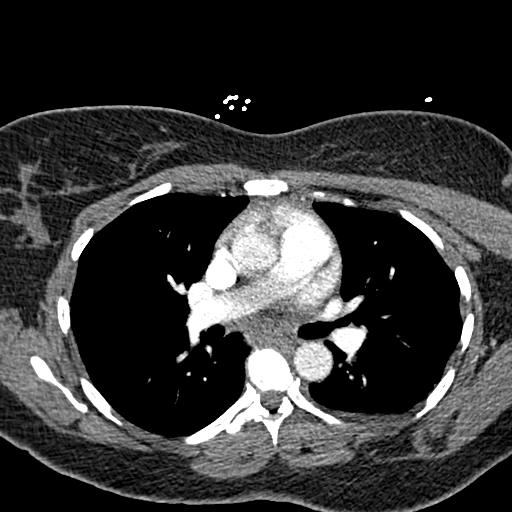
[im 117/224  lung]
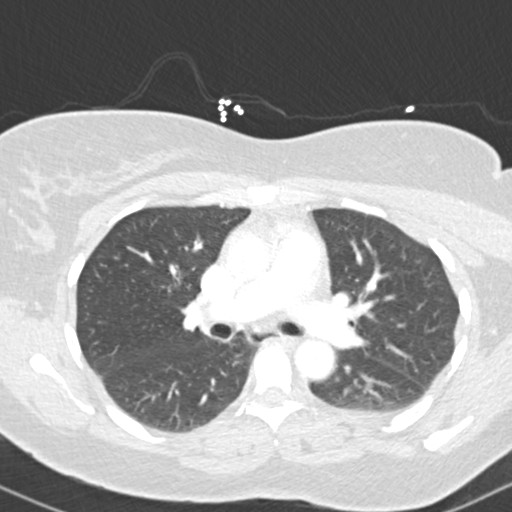
[im 136/224  soft-tissue]
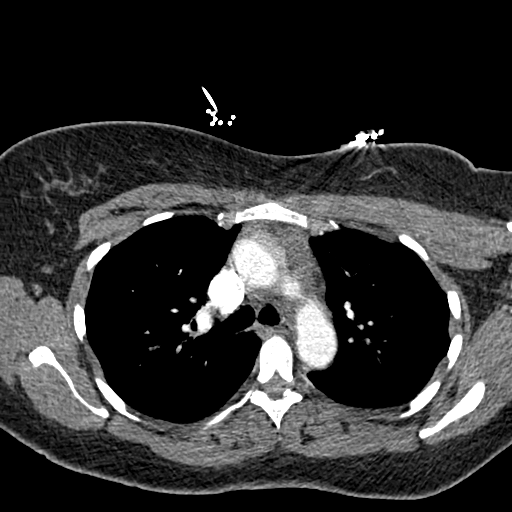
[im 146/224  lung]
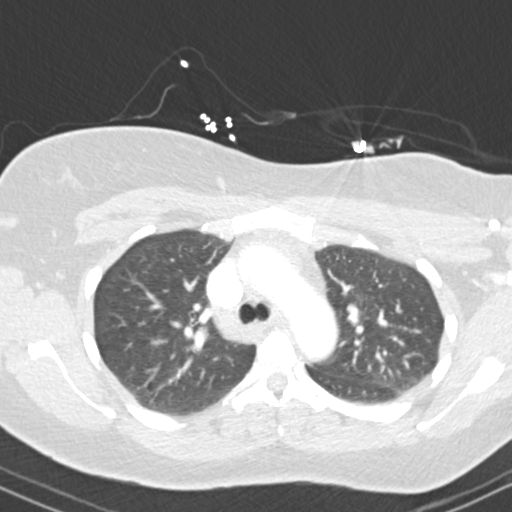
[im 156/224  soft-tissue]
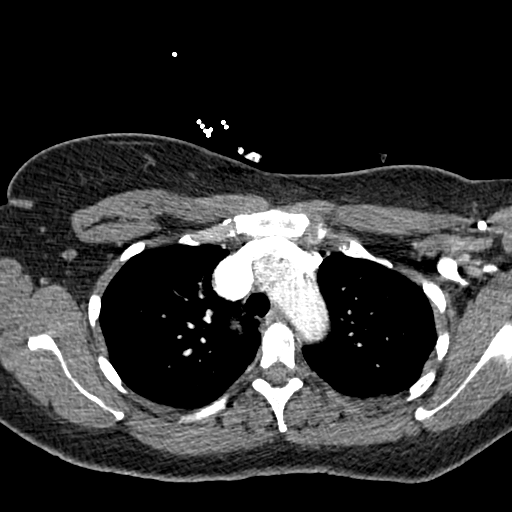
[im 175/224  lung]
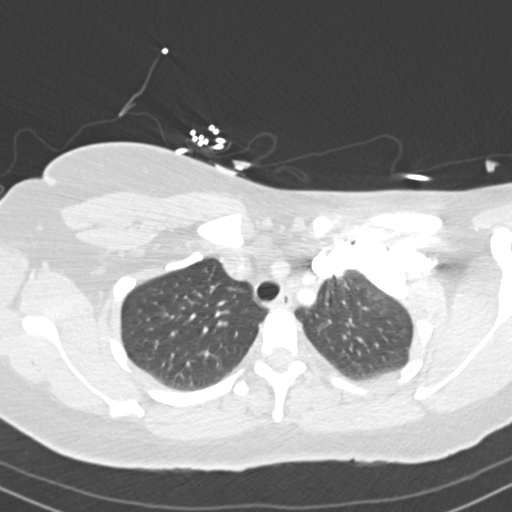
[im 185/224  soft-tissue]
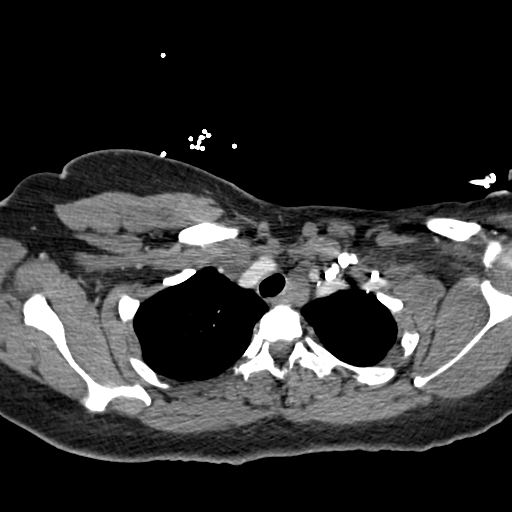
[im 194/224  lung]
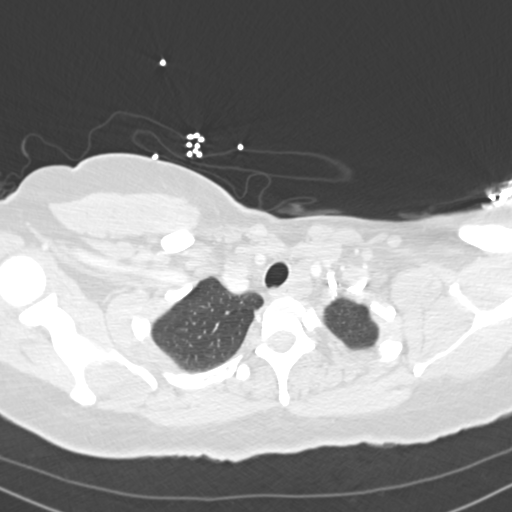
[im 214/224  soft-tissue]
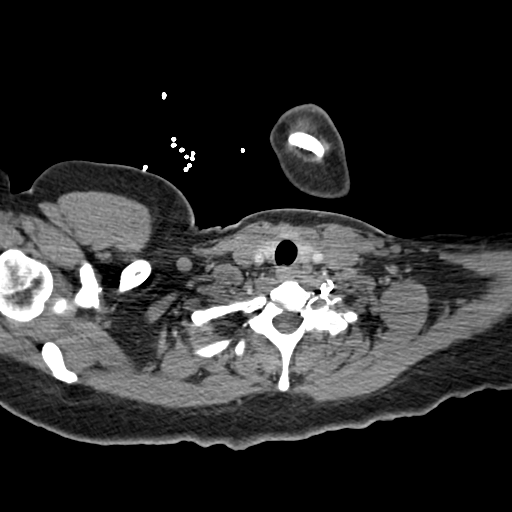

[Series 604: cor · coronal · 0.60mm/px · 3 of 88 slices shown]
[im 22/88  soft-tissue]
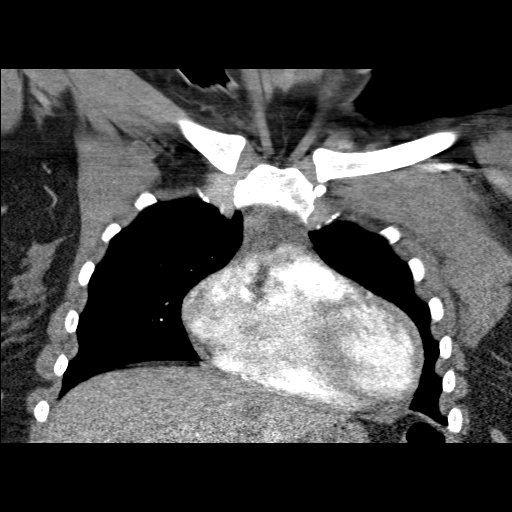
[im 44/88  soft-tissue]
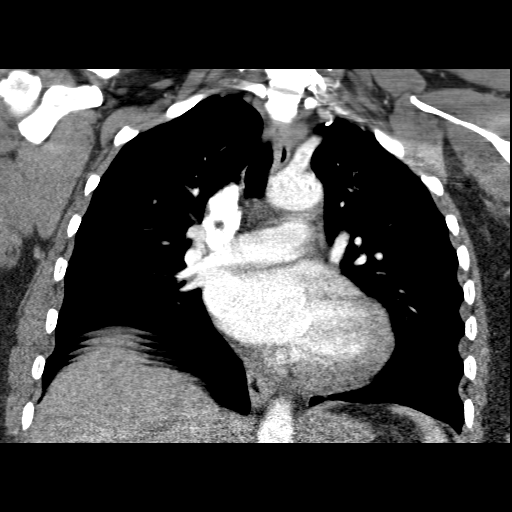
[im 66/88  soft-tissue]
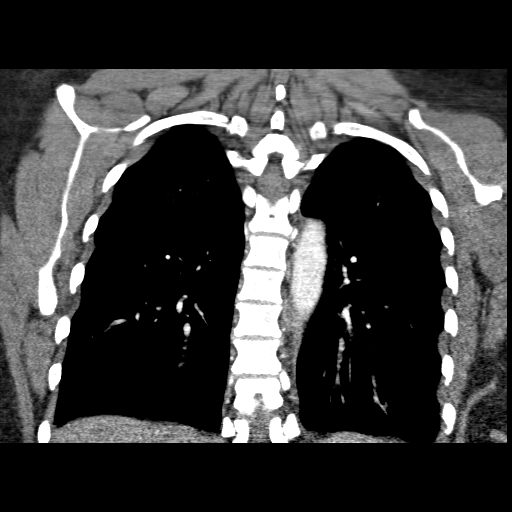

[19 of 46 positions shown; findings below may reference images not displayed]

FINDINGS: There is no pulmonary embolus identified.  Subsegmental
vessels are poorly evaluated secondary to motion artifact
(respiratory) and some bolus dispersion.  The heart appears normal.
No effusion.  Aorta and branch vessels appear normal.  No
adenopathy.  Mild dependent atelectasis present within the lungs.
Central airways patent.  Bones appear within normal limits.

Review of the MIP images confirms the above findings.
IMPRESSION: 1.  Negative for pulmonary embolus.
2.  No acute cardiopulmonary disease.  Mild dependent atelectasis
in the lungs.

## 2013-11-14 IMAGING — CT CT HEAD W/O CM
2 series · 16 of 30 positions shown, 20 images · non-contrast
Comparison: None

CLINICAL DATA: Headache, visual changes.

CT HEAD WITHOUT CONTRAST
TECHNIQUE: Contiguous axial images were obtained from the base of
the skull through the vertex without contrast.

[Series 2: head w/o · axial · non-contrast · 0.43mm/px · z∈[-148,-28]mm · 13 of 30 slices shown, 17 images]
[im 3/30  brain]
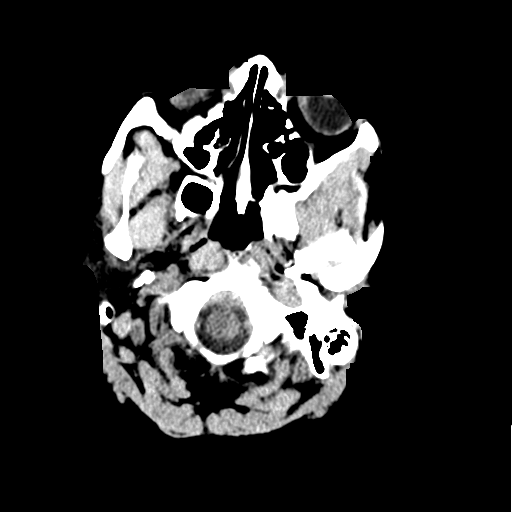
[im 3/30  bone]
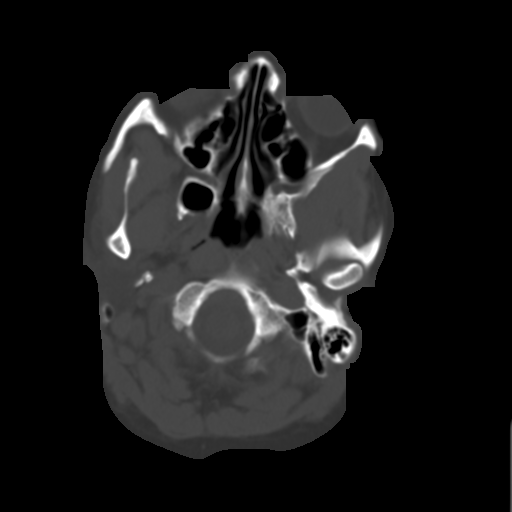
[im 5/30  brain]
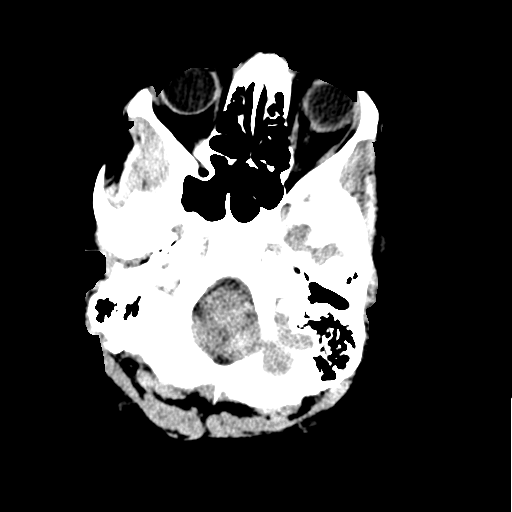
[im 7/30  brain]
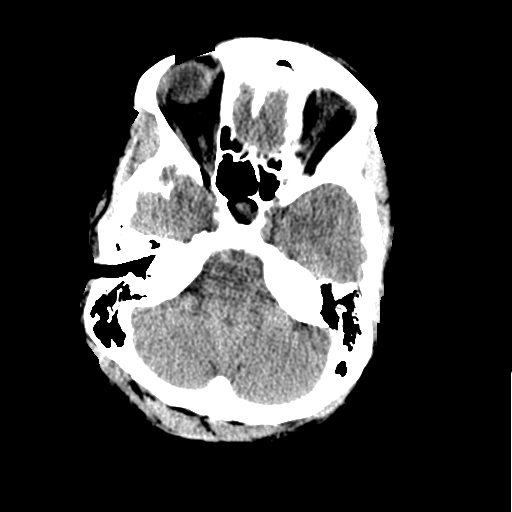
[im 9/30  brain]
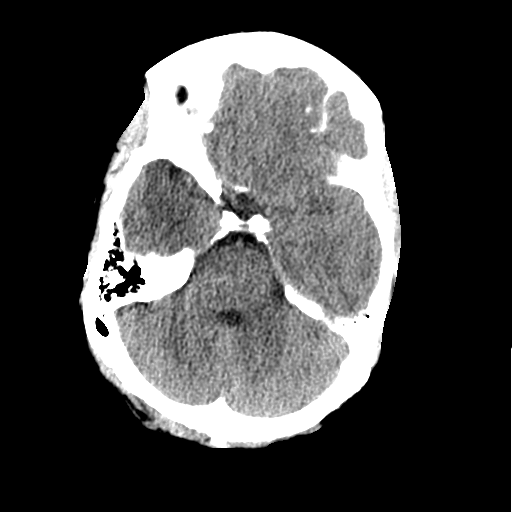
[im 11/30  brain]
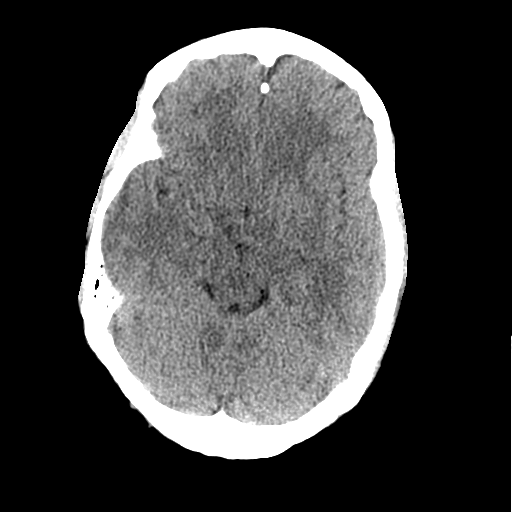
[im 11/30  bone]
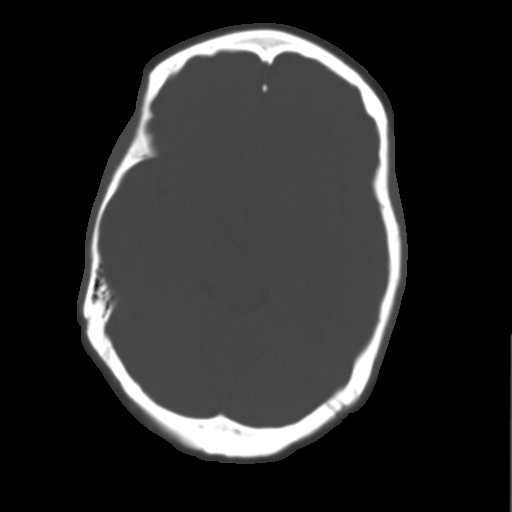
[im 13/30  brain]
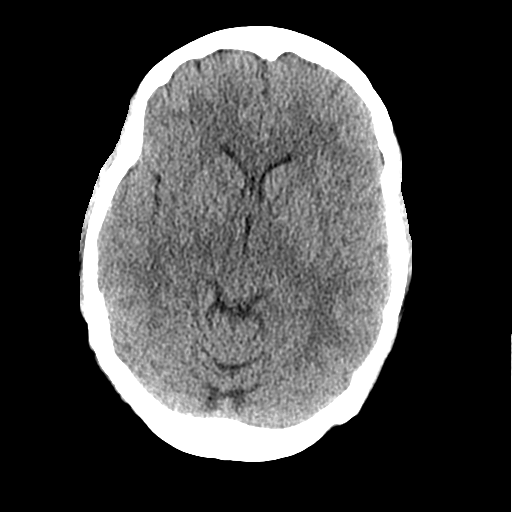
[im 15/30  brain]
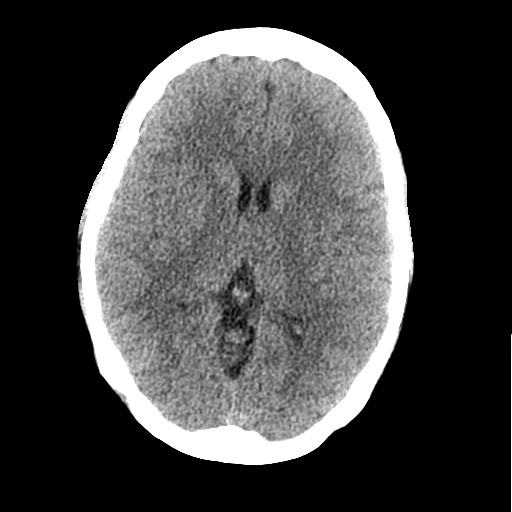
[im 17/30  brain]
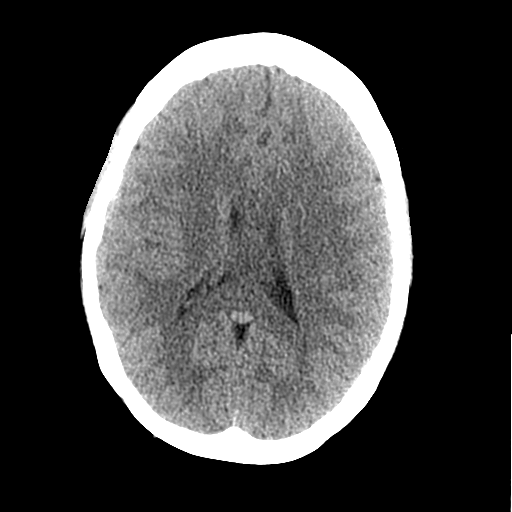
[im 19/30  brain]
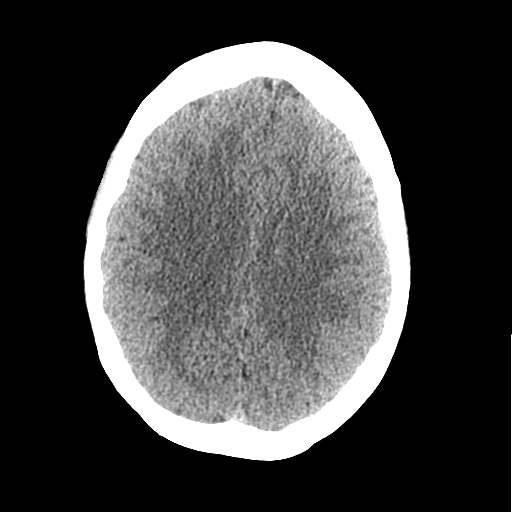
[im 19/30  bone]
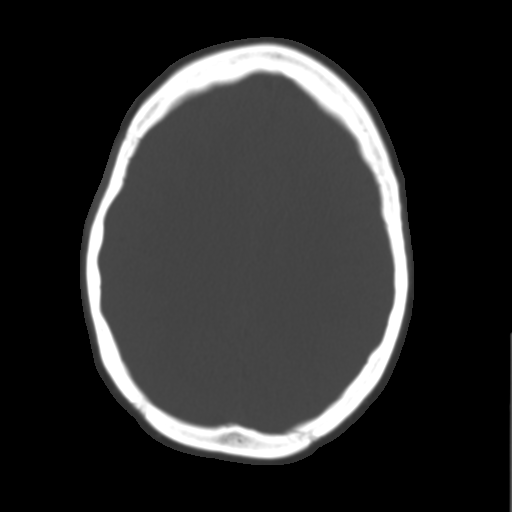
[im 21/30  brain]
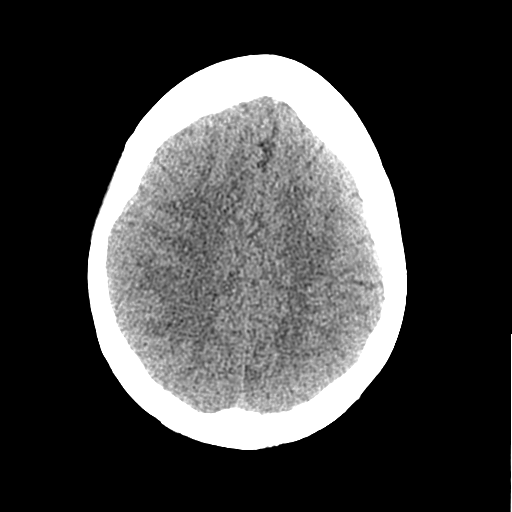
[im 23/30  brain]
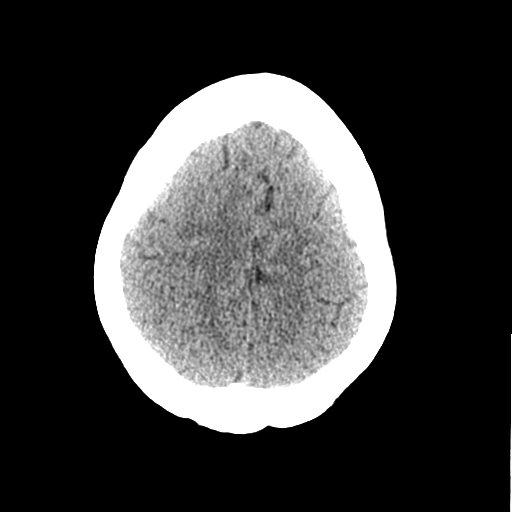
[im 25/30  brain]
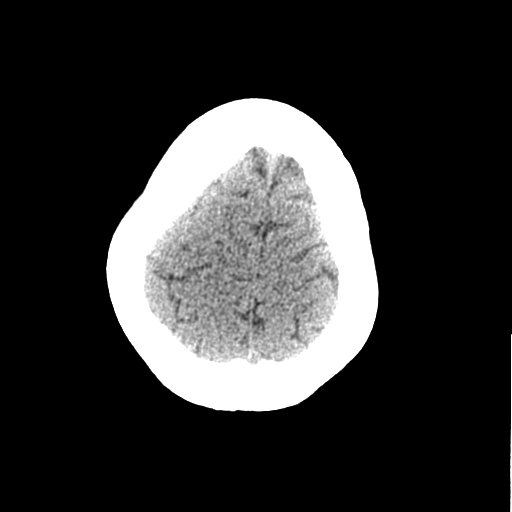
[im 27/30  brain]
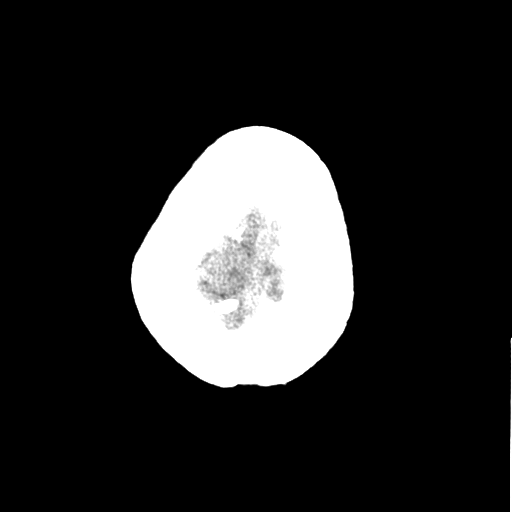
[im 27/30  bone]
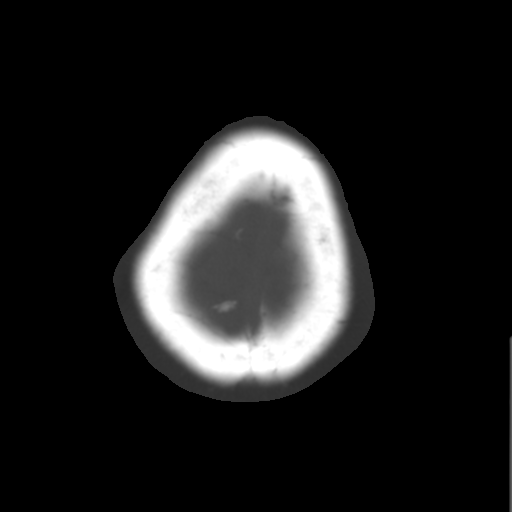

[Series 3: bone windows · axial · 0.43mm/px · z∈[-148,-108]mm · 3 of 30 slices shown]
[im 3/30  bone]
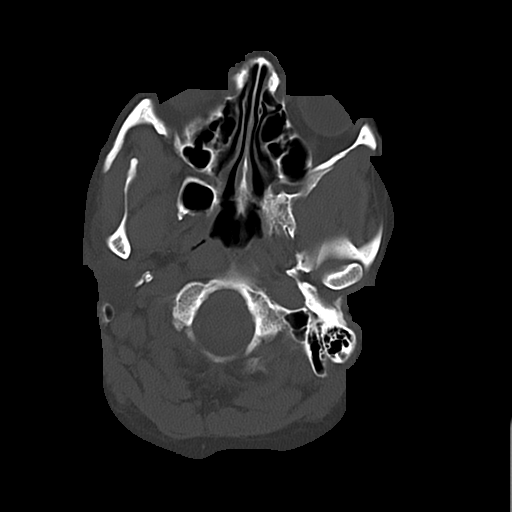
[im 7/30  bone]
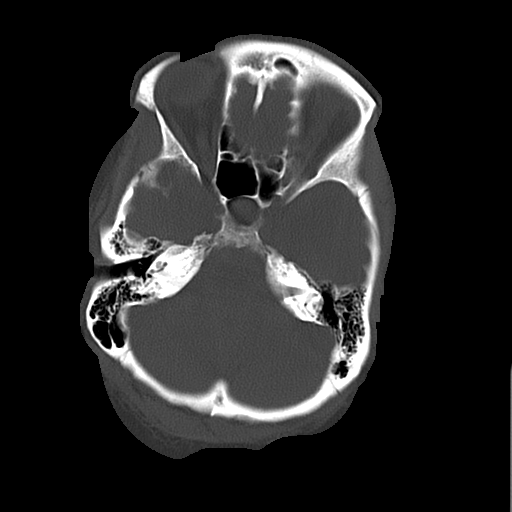
[im 11/30  bone]
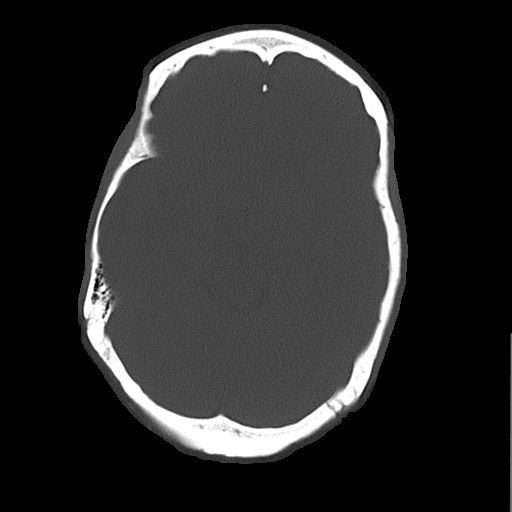

[16 of 30 positions shown; findings below may reference images not displayed]

FINDINGS: No acute intracranial abnormality.  Specifically, no
hemorrhage, hydrocephalus, mass lesion, acute infarction, or
significant intracranial injury.  No acute calvarial abnormality.
Visualized paranasal sinuses and mastoids clear.  Orbital soft
tissues unremarkable.
IMPRESSION: Normal study

## 2014-01-03 ENCOUNTER — Encounter (HOSPITAL_COMMUNITY): Payer: Self-pay | Admitting: Emergency Medicine

## 2014-01-08 ENCOUNTER — Encounter (HOSPITAL_COMMUNITY): Payer: Self-pay | Admitting: *Deleted

## 2014-01-08 ENCOUNTER — Emergency Department (HOSPITAL_COMMUNITY)
Admission: EM | Admit: 2014-01-08 | Discharge: 2014-01-08 | Disposition: A | Discharge status: Home / Self Care | Payer: Self-pay | Attending: Emergency Medicine | Admitting: Emergency Medicine

## 2014-01-08 DIAGNOSIS — K219 Gastro-esophageal reflux disease without esophagitis: Secondary | ICD-10-CM | POA: Insufficient documentation

## 2014-01-08 DIAGNOSIS — R011 Cardiac murmur, unspecified: Secondary | ICD-10-CM | POA: Insufficient documentation

## 2014-01-08 DIAGNOSIS — T85848A Pain due to other internal prosthetic devices, implants and grafts, initial encounter: Secondary | ICD-10-CM

## 2014-01-08 DIAGNOSIS — Z862 Personal history of diseases of the blood and blood-forming organs and certain disorders involving the immune mechanism: Secondary | ICD-10-CM | POA: Insufficient documentation

## 2014-01-08 DIAGNOSIS — E669 Obesity, unspecified: Secondary | ICD-10-CM | POA: Insufficient documentation

## 2014-01-08 DIAGNOSIS — K088 Other specified disorders of teeth and supporting structures: Secondary | ICD-10-CM | POA: Insufficient documentation

## 2014-01-08 DIAGNOSIS — F419 Anxiety disorder, unspecified: Secondary | ICD-10-CM | POA: Insufficient documentation

## 2014-01-08 DIAGNOSIS — K029 Dental caries, unspecified: Secondary | ICD-10-CM | POA: Insufficient documentation

## 2014-01-08 DIAGNOSIS — Z792 Long term (current) use of antibiotics: Secondary | ICD-10-CM | POA: Insufficient documentation

## 2014-01-08 DIAGNOSIS — Z79899 Other long term (current) drug therapy: Secondary | ICD-10-CM | POA: Insufficient documentation

## 2014-01-08 MED ORDER — TRAMADOL HCL 50 MG PO TABS: 50.0000 mg | ORAL_TABLET | Freq: Four times a day (QID) | ORAL | Status: DC | PRN

## 2014-01-08 MED ORDER — IBUPROFEN 400 MG PO TABS
400.0000 mg | ORAL_TABLET | Freq: Once | ORAL | Status: AC
  Administered 2014-01-08: 400 mg via ORAL
  Filled 2014-01-08: qty 1

## 2014-01-08 MED ORDER — AMOXICILLIN 500 MG PO CAPS: 500.0000 mg | ORAL_CAPSULE | Freq: Three times a day (TID) | ORAL | Status: DC

## 2014-01-08 NOTE — ED Provider Notes (Signed)
CSN: [id number]     Arrival date & time 01/08/14  0705 History   First MD Initiated Contact with Patient 01/08/14 0735     Chief Complaint  Patient presents with  . Dental Pain     (Consider location/radiation/quality/duration/timing/severity/associated sxs/prior Treatment) HPI   Patient presents to the emergency department with a dental complaint. Symptoms began 3 days ago. The patient has tried to alleviate pain with no medications at home.  Pain rated at a 3/10, characterized as throbbing in nature and located right lower jaw. She has a crown, felt a bump with some swelling and became concerned, she also has a broken tooth right behind that one. Patient denies fever, night sweats, chills, difficulty swallowing or opening mouth, SOB, nuchal rigidity or decreased ROM of neck.  Patient does not have a dentist and requests a resource guide at discharge.   Past Medical History  Diagnosis Date  . Anemia   . Anxiety   . GERD (gastroesophageal reflux disease)   . Obesity   . Hemorrhoids   . Anemia   . Fibroids   . Heart murmur    History reviewed. No pertinent past surgical history. Family History  Problem Relation Age of Onset  . Colon cancer Maternal Uncle   . Breast cancer Maternal Aunt   . Heart disease Maternal Aunt   . Pancreatic cancer Maternal Grandmother   . Cancer Maternal Grandmother     pancreas  . Diabetes Maternal Uncle   . Hypertension Mother   . Renal Disease Mother    History  Substance Use Topics  . Smoking status: Never Smoker   . Smokeless tobacco: Never Used  . Alcohol Use: Yes     Comment: occasionally   OB History    Gravida Para Term Preterm AB TAB SAB Ectopic Multiple Living   1 1 1       1      Review of Systems  HENT: Positive for dental problem.     10 Systems reviewed and are negative for acute change except as noted in the HPI.     Allergies  Food; Shellfish allergy; and Flagyl  Home Medications   Prior to Admission medications     Medication Sig Start Date End Date Taking? Authorizing Provider  acetaminophen (TYLENOL) 500 MG tablet Take 500-1,000 mg by mouth every 6 (six) hours as needed for mild pain.    Historical Provider, MD  amoxicillin (AMOXIL) 500 MG capsule Take 1 capsule (500 mg total) by mouth 3 (three) times daily. 01/08/14   Bailei Buist Marilu Favre, PA-C  aspirin 325 MG tablet Take 650 mg by mouth every 6 (six) hours as needed for mild pain.     Historical Provider, MD  bismuth subsalicylate (PEPTO BISMOL) 262 MG/15ML suspension Take 30 mLs by mouth every 6 (six) hours as needed. For upset stomach    Historical Provider, MD  Cholecalciferol (VITAMIN D) 2000 UNITS CAPS Take 2,000 Units by mouth daily.     Historical Provider, MD  ferrous sulfate 325 (65 FE) MG tablet Take 325 mg by mouth daily.     Historical Provider, MD  ibuprofen (ADVIL,MOTRIN) 800 MG tablet Take 800 mg by mouth 2 (two) times daily as needed for moderate pain.    Historical Provider, MD  oxyCODONE-acetaminophen (PERCOCET) 5-325 MG per tablet Take 1 tablet by mouth every 4 (four) hours as needed. 02/19/13   Sherrie George, PA-C  penicillin v potassium (VEETID) 500 MG tablet Take 1 tablet (500 mg total)  by mouth 4 (four) times daily. X 7 days 02/19/13   Sherrie George, PA-C  ranitidine (ZANTAC) 75 MG tablet Take 75 mg by mouth daily as needed for heartburn.    Historical Provider, MD  traMADol (ULTRAM) 50 MG tablet Take 1 tablet (50 mg total) by mouth every 6 (six) hours as needed. 01/08/14   Geo Slone Marilu Favre, PA-C   BP 117/62 mmHg  Pulse 69  Temp(Src) 98 F (36.7 C) (Oral)  Resp 19  SpO2 100%  LMP 12/26/2013 Physical Exam  Constitutional: She appears well-developed and well-nourished. No distress.  HENT:  Head: Normocephalic and atraumatic.  Mouth/Throat: Uvula is midline, oropharynx is clear and moist and mucous membranes are normal. Normal dentition. Dental caries (Pts tooth shows no obvious abscess but moderate to severe tenderness to  palpation of marked tooth) present. No uvula swelling.    No nuchal rigidity.  No signs of ludwig's angina or trismus.  Eyes: Pupils are equal, round, and reactive to light.  Neck: Trachea normal, normal range of motion and full passive range of motion without pain. Neck supple.  Cardiovascular: Normal rate, regular rhythm, normal heart sounds and normal pulses.   Pulmonary/Chest: Effort normal and breath sounds normal. No respiratory distress. Chest wall is not dull to percussion. She exhibits no tenderness, no crepitus, no edema, no deformity and no retraction.  Abdominal: Normal appearance.  Musculoskeletal: Normal range of motion.  Neurological: She is alert. She has normal strength.  Skin: Skin is warm, dry and intact. She is not diaphoretic.  Psychiatric: She has a normal mood and affect. Her speech is normal. Cognition and memory are normal.    ED Course  Procedures (including critical care time) Labs Review Labs Reviewed - No data to display  Imaging Review No results found.   EKG Interpretation None      MDM   Final diagnoses:  Dental implant pain, initial encounter    Patient has dental pain. No emergent s/sx's present. Patent airway. No trismus. NO symptoms of Ludwigs angina. Will be given pain medication and antibiotics. I discussed the need to call dentist within 24/48 hours for follow-up. Dental referral given. Return to ED precautions given.  Pt voiced understanding and has agreed to follow-up.   48 y.o.Theresa Peterson's evaluation in the Emergency Department is complete. It has been determined that no acute conditions requiring further emergency intervention are present at this time. The patient/guardian have been advised of the diagnosis and plan. We have discussed signs and symptoms that warrant return to the ED, such as changes or worsening in symptoms.  Vital signs are stable at discharge. Filed Vitals:   01/08/14 0711  BP: 117/62  Pulse: 69    Temp: 98 F (36.7 C)  Resp: 19    Patient/guardian has voiced understanding and agreed to follow-up with the PCP or specialist.      Linus Mako, PA-C 01/08/14 0800  [place], DO 01/08/14 1530

## 2014-01-08 NOTE — ED Notes (Signed)
Pt reports pain to Rt lower tooth for a while.

## 2014-01-08 NOTE — ED Notes (Signed)
Declined W/C at D/C and was escorted to lobby by RN. 

## 2014-01-08 NOTE — Discharge Instructions (Signed)
Dental Pain °A tooth ache may be caused by cavities (tooth decay). Cavities expose the nerve of the tooth to air and hot or cold temperatures. It may come from an infection or abscess (also called a boil or furuncle) around your tooth. It is also often caused by dental caries (tooth decay). This causes the pain you are having. °DIAGNOSIS  °Your caregiver can diagnose this problem by exam. °TREATMENT  °· If caused by an infection, it may be treated with medications which kill germs (antibiotics) and pain medications as prescribed by your caregiver. Take medications as directed. °· Only take over-the-counter or prescription medicines for pain, discomfort, or fever as directed by your caregiver. °· Whether the tooth ache today is caused by infection or dental disease, you should see your dentist as soon as possible for further care. °SEEK MEDICAL CARE IF: °The exam and treatment you received today has been provided on an emergency basis only. This is not a substitute for complete medical or dental care. If your problem worsens or new problems (symptoms) appear, and you are unable to meet with your dentist, call or return to this location. °SEEK IMMEDIATE MEDICAL CARE IF:  °· You have a fever. °· You develop redness and swelling of your face, jaw, or neck. °· You are unable to open your mouth. °· You have severe pain uncontrolled by pain medicine. °MAKE SURE YOU:  °· Understand these instructions. °· Will watch your condition. °· Will get help right away if you are not doing well or get worse. °Document Released: 02/18/2005 Document Revised: 05/13/2011 Document Reviewed: 10/07/2007 °ExitCare® Patient Information ©2015 [employer]. This information is not intended to replace advice given to you by your health care provider. Make sure you discuss any questions you have with your health care provider. ° ° °RESOURCE GUIDE ° °Chronic Pain Problems: °Contact [facility]  297-2271 °Patients need to be  referred by their primary care doctor. ° °Insufficient Money for Medicine: °Contact [employer] Way:  call "211" or [facility] 271-5999. ° °No Primary Care Doctor: °Call [facility]  832-8000 - can help you locate a primary care doctor that  accepts your insurance, provides certain services, etc. °Physician Referral Service- 1-800-533-3463 ° °Agencies that provide inexpensive medical care: °[facility]  832-8035 °[facility]  832-7272 °[facility]  [phone] °Women[facility] Clinic  832-4777 °Planned Parenthood  373-0678 °[facility]  272-1050 ° °Medicaid-accepting [place] Providers: °[facility]- [address] ° [phone], Mon-Fri 9am-7pm, Sat 9am-1pm °[facility]- [address], [address] ° [phone] °[facility]- [address] ° [phone] °[facility]- [address] [place] [address] ° [phone] °[facility]- [address], [phone] ° Only accepts [place] Access Medicaid patients after they have their name  applied to their card ° °Self Pay (no insurance) in [place]: °Sickle Cell Patients: Dr Eric Dean, [facility] ° [address], [phone] °[facility]- [address] ° [phone] °      -     [facility]- [address] °      -     [facility]- see information above (Speak to Pam H if you do not have insurance) °      -  [facility]- [address], [phone] °      -  [facility] [place]- [address],  [phone] °      -  [facility]- [address] [place] [address], [phone] °      -  Dr Osei-Bonsu-  [address], [place], [phone] °      -  [facility]- [address], [phone] °      -  [facility]- [address] [place] [address], [phone], also [address], [phone] °      -     [facility]- [address], [phone], 1st & 3rd Saturday   every month, 10am-1pm ° °1) Find a Doctor and Pay Out of Pocket °Although you won't have to find out who is covered by your insurance plan, it is a good idea to ask around and get recommendations. You will then need to call the office and see if the doctor you have chosen will accept you as a new patient and what types of options they offer for patients who are self-pay. Some doctors offer discounts or will set up payment plans for their patients who do not have insurance, but you will need to ask so you aren't surprised when you get to your appointment. ° °2) Contact Your Local Health Department °Not all health departments have doctors that can see patients for sick visits, but many do, so it is worth a call to see if yours does. If you don't know where your local health department is, you can check in your phone book. The CDC also has a tool to help you locate your state's health department, and many state websites also have listings of all of their local health departments. ° °3) Find a Walk-in Clinic °If your illness is not likely to be very severe or complicated, you may want to try a walk in clinic. These are popping up all over the country in pharmacies, drugstores, and shopping centers. They're usually staffed by nurse practitioners or physician assistants that have been trained to treat common illnesses and complaints. They're usually fairly quick and inexpensive. However, if you have serious medical issues or chronic medical problems, these are probably not your best option ° °STD Testing °[place] [facility], [facility], [address], [place], phone [phone] or 1-877-539-9860.  Monday - Friday, call for an appointment. °[place] [facility] [place], [facility] Clinic, [address], [place], phone [phone] or 1-877-539-9860.  Monday - Friday, call for an  appointment. ° °Abuse/Neglect: °[place] Child Abuse Hotline (336) 641-3795 °[place] Child Abuse Hotline 800-378-5315 (After Hours) ° °Emergency Shelter:   [facility] (336) 271-5985 ° °Maternity Homes: °[facility] (336) 275-9566 °[facility] (704) 372-4663 ° °MRSA Hotline #:   832-7006 ° °[place] Resources ° °[facility] [place]  [employer] [place] [facility] °[address]                 [address]         [address]  °[place]                                               [place]                                [place] °Phone:  349-3220                                  Phone:  [phone]                   Phone:  342-8140 ° °[place] [facility], 342-8316 °[place] [facility] - [facility]- 1-888-581-9988 °      -     [facility] in [place], [address],                                  336-349-4454, Insurance ° °[place] Child Abuse Hotline °(336) 342-1394 or (336) 342-3537 (After Hours) ° ° °Behavioral Health Services ° °Substance Abuse Resources: °[facility]  336-882-2125 °[facility] 336-784-9470 °[facility] 336-285-9073 °[facility] 336-845-3988 °Residential & Outpatient Substance Abuse Program  800-659-3381 ° °Psychological Services: °[facility]  832-9600 °[facility]  378-7881 °[facility], [address], [place], ACCESS LINE: 1-800-853-5163 or 336-641-4981, [email or web address].[email or web address] ° °Dental Assistance ° °If unable to pay or uninsured, contact:  [facility] or [place] [facility] to become qualified for the adult dental clinic. ° °Patients with Medicaid: [facility] °[address], 632-0744 °[address], 510-2600 ° °If unable to pay, or uninsured, contact  [facility] (271-5999) or [place] [facility] (641-3152 in [place], 842-7733 in [place]) to become qualified for the adult dental clinic ° °Other Low-Cost Community Dental Services: °[facility]- [address], [place], [place], [address], 723-1848, Ext. 123, 2nd and 4th Thursday of the month at 6:30am.  10 clients each day by appointment, can sometimes see walk-in patients if someone does not show for an appointment. °[facility]- [address], [place], [place], [address], 723-7904 °[facility]- [address], [place], [place], [address], 631-2330 °[facility]- 342-8273 °[facility]- 703-3100 °[facility]- 570-6415 ° °Please make every effort to establish with a primary care physician for routine medical care ° °Adult Health Services  °The [place] [facility] provides a wide range of adult health services. Some of these services are designed to address the healthcare needs of all [place] residents and all services are designed to meet the needs of uninsured/underinsured low income residents. Some services are available to any resident of [place], call [phone] for details. °] °[facility], a new medical clinic for adults, is now open. For more information about the Center and its services please call [phone]. °For information on our [facility] services, click here. ° °For more information on any of the following Department of Public Health programs, including hours of service, click on the highlighted link. ° °SERVICES FOR WOMEN (Adults and Teens) °Family Planning Services provide a full range of birth control options plus education and counseling. New patient visit and annual return visits include a complete examination, pap test as indicated, and other laboratory as indicated. Included is our Regional Vasectomy Program  for men. ° °Maternity Care is provided through pregnancy, including a six week post partum exam. Women who meet eligibility criteria for the Medicaid for Pregnant Women program, receive care free. Other women are charged on a sliding scale according to income. °Note: Our   Dental Clinic provides services to pregnant women who have a Medicaid card. Call [phone] for an appointment in [place] or [phone] for an appointment in [place]. ° °Primary Care for Medicaid [place] Access Women is available through the [place] [facility]. As primary care provider for the [place] Medicaid [place] Access Medicaid Managed Care program, women may designate the Women’s Health clinic as their primary care provider. ° °PLEASE CALL [phone] FOR AN APPOINTMENT FOR THE ABOVE SERVICES IN EITHER [place] OR [place]. Information available in English and Spanish.  ° °Childbirth Education Classes are open to the public and offered to help families prepare for the best possible childbirth experience as well as to promote lifelong health and wellness. Classes are offered throughout the year and meet on the same night once a week for five weeks. Medicaid covers the cost of the classes for the mother-to-be and her partner. For participants without Medicaid, the cost of the class series is $45.00 for the mother-to-be and her partner. Class size is limited and registration is required. For more information or to register call 336-641-4718. Baby items donated by [employer] and the [employer] [place] are given away during each class series. ° °SERVICES FOR WOMEN AND MEN °Sexually Transmitted Infection appointments, including HIV testing, are available daily (weekdays, except holidays). Call early as same-day appointments are limited. For an appointment in either [place] or [place], call [phone]. Services are confidential and free of charge. ° °Skin Testing for Tuberculosis Please call  [phone]. °Adult Immunizations are available, usually for a fee. Please call [phone] for details. ° °PLEASE CALL [phone] FOR AN APPOINTMENT FOR THE ABOVE SERVICES IN EITHER [place] OR [place].  ° °[facility] provides up to the minute recommended vaccines for your travel destination. We also provide essential health and political information to help insure a safe and pleasurable travel experience. This program is self-sustaining, however, fees are very competitive. We are a CERTIFIED YELLOW FEVER IMMUNIZATION approved clinic site. °PLEASE CALL [phone] FOR AN APPOINTMENT IN EITHER [place] OR [place].  ° °If you have questions about the services listed above, we want to answer them! Email us at: [email or web address] °Home Visiting Services for elderly and the disabled are available to residents of [place] who are in need of care that compares to the care offered by a nursing home, have needs that can be met by the program, and have [place] Medicaid. Other short term services are available to residents 18 years and older who are unable to meet requirements for eligibility to receive services from a certified home health agency, spend the majority of time at home, and need care for six months or less. ° °PLEASE CALL [phone] OR [phone] FOR MORE INFORMATION. °Medication Assistance Program serves as a link between pharmaceutical companies and patients to provide low cost or free prescription medications. This servce is available for residents who meet certain income restrictions and have no insurance coverage. ° °PLEASE CALL 641-8030 () OR [phone] ([place]) FOR MORE INFORMATION.  °Updated Feb. 21, 2013 ° ° °

## 2014-04-23 ENCOUNTER — Encounter (HOSPITAL_COMMUNITY): Payer: Self-pay | Admitting: Emergency Medicine

## 2014-04-23 ENCOUNTER — Emergency Department (HOSPITAL_COMMUNITY): Payer: Self-pay

## 2014-04-23 ENCOUNTER — Emergency Department (HOSPITAL_COMMUNITY)
Admission: EM | Admit: 2014-04-23 | Discharge: 2014-04-23 | Disposition: A | Discharge status: Home / Self Care | Payer: Self-pay | Attending: Emergency Medicine | Admitting: Emergency Medicine

## 2014-04-23 DIAGNOSIS — F419 Anxiety disorder, unspecified: Secondary | ICD-10-CM | POA: Insufficient documentation

## 2014-04-23 DIAGNOSIS — R0789 Other chest pain: Secondary | ICD-10-CM

## 2014-04-23 DIAGNOSIS — Z792 Long term (current) use of antibiotics: Secondary | ICD-10-CM | POA: Insufficient documentation

## 2014-04-23 DIAGNOSIS — J209 Acute bronchitis, unspecified: Secondary | ICD-10-CM | POA: Insufficient documentation

## 2014-04-23 DIAGNOSIS — Z79899 Other long term (current) drug therapy: Secondary | ICD-10-CM | POA: Insufficient documentation

## 2014-04-23 DIAGNOSIS — Z8719 Personal history of other diseases of the digestive system: Secondary | ICD-10-CM | POA: Insufficient documentation

## 2014-04-23 DIAGNOSIS — J40 Bronchitis, not specified as acute or chronic: Secondary | ICD-10-CM

## 2014-04-23 DIAGNOSIS — Z8742 Personal history of other diseases of the female genital tract: Secondary | ICD-10-CM | POA: Insufficient documentation

## 2014-04-23 DIAGNOSIS — D649 Anemia, unspecified: Secondary | ICD-10-CM | POA: Insufficient documentation

## 2014-04-23 DIAGNOSIS — Z7982 Long term (current) use of aspirin: Secondary | ICD-10-CM | POA: Insufficient documentation

## 2014-04-23 DIAGNOSIS — R011 Cardiac murmur, unspecified: Secondary | ICD-10-CM | POA: Insufficient documentation

## 2014-04-23 DIAGNOSIS — E669 Obesity, unspecified: Secondary | ICD-10-CM | POA: Insufficient documentation

## 2014-04-23 MED ORDER — GUAIFENESIN 100 MG/5ML PO LIQD: 100.0000 mg | ORAL | Status: DC | PRN

## 2014-04-23 MED ORDER — BENZONATATE 100 MG PO CAPS: 100.0000 mg | ORAL_CAPSULE | Freq: Three times a day (TID) | ORAL | Status: DC

## 2014-04-23 MED ORDER — PREDNISONE 20 MG PO TABS: ORAL_TABLET | ORAL | Status: DC

## 2014-04-23 MED ORDER — AZITHROMYCIN 250 MG PO TABS: 250.0000 mg | ORAL_TABLET | Freq: Every day | ORAL | Status: DC

## 2014-04-23 NOTE — ED Notes (Signed)
Pt reports productive cough for 30 days, yellow to green mucous with post nasal drip. Pt reports her coughing spells can cause chest pain.

## 2014-04-23 NOTE — Discharge Instructions (Signed)

## 2014-04-23 NOTE — ED Provider Notes (Signed)
CSN: [id number]     Arrival date & time 04/23/14  0805 History   First MD Initiated Contact with Patient 04/23/14 [phone]     Chief Complaint  Patient presents with  . Cough  . Chest Pain     (Consider location/radiation/quality/duration/timing/severity/associated sxs/prior Treatment) HPI  Pt is a 49yo female with hx of anemia, anxiety, GERD, and heart murmur, presenting to ED with c/o productive cough for 1 month, produces yellow to green mucous with post-nasal drip.  Pt states when she initially got sick 1 month ago, she had fever, chills, and nausea but those symptoms has since resolved.  Now, pt has an intermittent productive cough, resulting in coughing spells that cause centralized chest pain. Pt denies any CP or SOB at this time. States she uses alka-seltzer cold and allergy, which provides moderate relief. She had an appointment with the [facility] but her PCP was out of the office, she was unable to be seen so she came to the ED.  Reports grandchildren have had a cough.  No recent travel. No hx of CAD, asthma, or COPD. No previous hx of blood clots. Pt is not a smoker. No leg pain or swelling.    Past Medical History  Diagnosis Date  . Anemia   . Anxiety   . GERD (gastroesophageal reflux disease)   . Obesity   . Hemorrhoids   . Anemia   . Fibroids   . Heart murmur    History reviewed. No pertinent past surgical history. Family History  Problem Relation Age of Onset  . Colon cancer Maternal Uncle   . Breast cancer Maternal Aunt   . Heart disease Maternal Aunt   . Pancreatic cancer Maternal Grandmother   . Cancer Maternal Grandmother     pancreas  . Diabetes Maternal Uncle   . Hypertension Mother   . Renal Disease Mother    History  Substance Use Topics  . Smoking status: Never Smoker   . Smokeless tobacco: Never Used  . Alcohol Use: Yes     Comment: occasionally   OB History    Gravida Para Term Preterm AB TAB SAB Ectopic Multiple Living   1 1 1       1      Review of  Systems  Constitutional: Negative for fever, chills, diaphoresis, appetite change and fatigue.  HENT: Positive for congestion. Negative for ear pain and sore throat.   Respiratory: Positive for cough. Negative for shortness of breath.   Cardiovascular: Positive for chest pain ( "only during coughing fits"). Negative for palpitations and leg swelling.  Gastrointestinal: Negative for nausea, vomiting, abdominal pain and diarrhea.  Musculoskeletal: Negative for myalgias and back pain.  All other systems reviewed and are negative.     Allergies  Food; Shellfish allergy; and Flagyl  Home Medications   Prior to Admission medications   Medication Sig Start Date End Date Taking? Authorizing Provider  aspirin 325 MG tablet Take 650 mg by mouth every other day.    Yes Historical Provider, MD  aspirin-sod bicarb-citric acid (ALKA-SELTZER) 325 MG TBEF tablet Take 325 mg by mouth daily as needed (cold symptoms).   Yes Historical Provider, MD  Cholecalciferol (VITAMIN D) 2000 UNITS CAPS Take 2,000 Units by mouth daily.    Yes Historical Provider, MD  ferrous sulfate 325 (65 FE) MG tablet Take 325 mg by mouth daily.    Yes Historical Provider, MD  ibuprofen (ADVIL,MOTRIN) 800 MG tablet Take 800 mg by mouth 2 (two) times daily as  needed for moderate pain.   Yes Historical Provider, MD  acetaminophen (TYLENOL) 500 MG tablet Take 500-1,000 mg by mouth every 6 (six) hours as needed for mild pain.    Historical Provider, MD  amoxicillin (AMOXIL) 500 MG capsule Take 1 capsule (500 mg total) by mouth 3 (three) times daily. 01/08/14   Tiffany Marilu Favre, PA-C  azithromycin (ZITHROMAX) 250 MG tablet Take 1 tablet (250 mg total) by mouth daily. Take first 2 tablets together, then 1 every day until finished. 04/23/14   Noland Fordyce, PA-C  benzonatate (TESSALON) 100 MG capsule Take 1 capsule (100 mg total) by mouth every 8 (eight) hours. 04/23/14   Noland Fordyce, PA-C  guaiFENesin (ROBITUSSIN) 100 MG/5ML liquid Take  5-10 mLs (100-200 mg total) by mouth every 4 (four) hours as needed for cough. 04/23/14   Noland Fordyce, PA-C  oxyCODONE-acetaminophen (PERCOCET) 5-325 MG per tablet Take 1 tablet by mouth every 4 (four) hours as needed. 02/19/13   Sherrie George, PA-C  penicillin v potassium (VEETID) 500 MG tablet Take 1 tablet (500 mg total) by mouth 4 (four) times daily. X 7 days 02/19/13   Sherrie George, PA-C  predniSONE (DELTASONE) 20 MG tablet 3 tabs po day one, then 2 po daily x 4 days 04/23/14   Noland Fordyce, PA-C  traMADol (ULTRAM) 50 MG tablet Take 1 tablet (50 mg total) by mouth every 6 (six) hours as needed. 01/08/14   Tiffany Marilu Favre, PA-C   BP 126/61 mmHg  Pulse 84  Temp(Src) 98.6 F (37 C) (Oral)  Ht 5\' 4"  (1.626 m)  Wt 225 lb (102.059 kg)  BMI 38.60 kg/m2  SpO2 100%  LMP 04/18/2014 Physical Exam  Constitutional: She appears well-developed and well-nourished. No distress.  Pt lying comfortably in exam bed, NAD.   HENT:  Head: Normocephalic and atraumatic.  Right Ear: Hearing, tympanic membrane, external ear and ear canal normal.  Left Ear: Hearing, tympanic membrane, external ear and ear canal normal.  Nose: Nose normal.  Mouth/Throat: Uvula is midline, oropharynx is clear and moist and mucous membranes are normal.  Eyes: Conjunctivae are normal. No scleral icterus.  Neck: Normal range of motion. Neck supple.  Cardiovascular: Normal rate, regular rhythm and normal heart sounds.   Pulmonary/Chest: Effort normal and breath sounds normal. No respiratory distress. She has no wheezes. She has no rales. She exhibits no tenderness.  Intermittent dry cough during exam, no respiratory distress. Lungs: CTAB  Abdominal: Soft. Bowel sounds are normal. She exhibits no distension and no mass. There is no tenderness. There is no rebound and no guarding.  Musculoskeletal: Normal range of motion.  Neurological: She is alert.  Skin: Skin is warm and dry. She is not diaphoretic.  Nursing note and  vitals reviewed.   ED Course  Procedures (including critical care time) Labs Review Labs Reviewed - No data to display  Imaging Review Dg Chest 2 View  04/23/2014   CLINICAL DATA:  Productive cough for 30 days.  EXAM: CHEST  2 VIEW  COMPARISON:  02/08/2013  FINDINGS: Mild cardiac enlargement. Both lungs are clear. The visualized skeletal structures are unremarkable.  IMPRESSION: No active cardiopulmonary disease.   Electronically Signed   By: Kerby Moors M.D.   On: 04/23/2014 08:59     EKG Interpretation None      MDM   Final diagnoses:  Other chest pain  Bronchitis    Pt presenting to ED with URI symptoms including a productive cough for one month that  now causes centralized chest pain. Pt denies CP or SOB in ED.  Pt is afebrile. No respiratory distress. Lungs: CTAB. O2-100% on RA.  PERC negative. Doubt ACS.  Will get CXR to ensure no underlying pneumonia.    CXR: no active cardiopulmonary disease. No evidence of pneumonia, however, due to duration of cough, will tx with prednisone and azithromycin, tessalon pearls and robitussin. Advised to f/u with PCP in 1 week if not improving. Return precautions provided. Pt verbalized understanding and agreement with tx plan.     Noland Fordyce, PA-C 04/23/14 2751  Virgel Manifold, MD 04/23/14 [phone]

## 2014-04-23 NOTE — ED Notes (Signed)
EKG not needed at this time per PA [facility].

## 2014-12-15 ENCOUNTER — Emergency Department (HOSPITAL_COMMUNITY)
Admission: EM | Admit: 2014-12-15 | Discharge: 2014-12-15 | Disposition: A | Discharge status: Home / Self Care | Payer: Self-pay | Attending: Emergency Medicine | Admitting: Emergency Medicine

## 2014-12-15 ENCOUNTER — Encounter (HOSPITAL_COMMUNITY): Payer: Self-pay | Admitting: Emergency Medicine

## 2014-12-15 ENCOUNTER — Emergency Department (HOSPITAL_COMMUNITY): Payer: Self-pay

## 2014-12-15 DIAGNOSIS — D649 Anemia, unspecified: Secondary | ICD-10-CM | POA: Insufficient documentation

## 2014-12-15 DIAGNOSIS — R079 Chest pain, unspecified: Secondary | ICD-10-CM | POA: Insufficient documentation

## 2014-12-15 DIAGNOSIS — R51 Headache: Secondary | ICD-10-CM | POA: Insufficient documentation

## 2014-12-15 DIAGNOSIS — Z86018 Personal history of other benign neoplasm: Secondary | ICD-10-CM | POA: Insufficient documentation

## 2014-12-15 DIAGNOSIS — M545 Low back pain: Secondary | ICD-10-CM | POA: Insufficient documentation

## 2014-12-15 DIAGNOSIS — F419 Anxiety disorder, unspecified: Secondary | ICD-10-CM | POA: Insufficient documentation

## 2014-12-15 DIAGNOSIS — M542 Cervicalgia: Secondary | ICD-10-CM | POA: Insufficient documentation

## 2014-12-15 DIAGNOSIS — K219 Gastro-esophageal reflux disease without esophagitis: Secondary | ICD-10-CM | POA: Insufficient documentation

## 2014-12-15 DIAGNOSIS — E669 Obesity, unspecified: Secondary | ICD-10-CM | POA: Insufficient documentation

## 2014-12-15 DIAGNOSIS — R011 Cardiac murmur, unspecified: Secondary | ICD-10-CM | POA: Insufficient documentation

## 2014-12-15 DIAGNOSIS — Z7982 Long term (current) use of aspirin: Secondary | ICD-10-CM | POA: Insufficient documentation

## 2014-12-15 LAB — BASIC METABOLIC PANEL
Anion gap: 5 (ref 5–15)
BUN: 7 mg/dL (ref 6–20)
CALCIUM: 10.3 mg/dL (ref 8.9–10.3)
CO2: 26 mmol/L (ref 22–32)
CREATININE: 0.79 mg/dL (ref 0.44–1.00)
Chloride: 109 mmol/L (ref 101–111)
Glucose, Bld: 101 mg/dL — ABNORMAL HIGH (ref 65–99)
Potassium: 4.2 mmol/L (ref 3.5–5.1)
SODIUM: 140 mmol/L (ref 135–145)

## 2014-12-15 LAB — I-STAT TROPONIN, ED: TROPONIN I, POC: 0 ng/mL (ref 0.00–0.08)

## 2014-12-15 LAB — CBC
HCT: 31.7 % — ABNORMAL LOW (ref 36.0–46.0)
Hemoglobin: 9.6 g/dL — ABNORMAL LOW (ref 12.0–15.0)
MCH: 23.8 pg — ABNORMAL LOW (ref 26.0–34.0)
MCHC: 30.3 g/dL (ref 30.0–36.0)
MCV: 78.7 fL (ref 78.0–100.0)
PLATELETS: 329 10*3/uL (ref 150–400)
RBC: 4.03 MIL/uL (ref 3.87–5.11)
RDW: 17.9 % — AB (ref 11.5–15.5)
WBC: 4 10*3/uL (ref 4.0–10.5)

## 2014-12-15 MED ORDER — IBUPROFEN 400 MG PO TABS
600.0000 mg | ORAL_TABLET | Freq: Once | ORAL | Status: AC
  Administered 2014-12-15: 600 mg via ORAL
  Filled 2014-12-15 (×2): qty 1

## 2014-12-15 NOTE — ED Notes (Signed)
Pt placed in gown and in bed. Pt monitored by pulse ox, bp cuff, and 12-lead. 

## 2014-12-15 NOTE — ED Notes (Signed)
Pt from home with c/o chest pain that radiates from various locations and is recurrent for months starting again this last week "on and off."  Pt reports a pimple at the base of her nose starting this past Sunday with numbness/tingling to left face and tongue yesterday afternoon.  Pt reports a headache and neck pain starting last night while laying in bed.  Pt denies SOB, or other cardiac symptoms.  Pt in NAD, A&O.

## 2014-12-15 NOTE — ED Provider Notes (Signed)
I saw and evaluated the patient, reviewed the resident's note and I agree with the findings and plan.   EKG Interpretation   Date/Time:  Thursday December 15 2014 08:07:49 EDT Ventricular Rate:  78 PR Interval:  151 QRS Duration: 75 QT Interval:  380 QTC Calculation: 433 R Axis:   88 Text Interpretation:  Sinus rhythm Minimal ST depression, inferior leads  Baseline wander in lead(s) II III aVF V3 V6 No significant change was  found Confirmed by [facility]  MD, TREY ([id number]) on 12/15/2014 9:42:29 AM      49 yo female with chest pain.  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  Pain is now sternal.  She complains also of a left nare nose sore with associated headache and left facial tingling.  On exam, well appearing, nontoxic, not distressed, normal respiratory effort, normal perfusion, left nare with erythematous papular lesion, normal facial strength, heart sounds normal with RRR, lungs CTAB, sternum tender to palpation.  History is inconsistent with ACS.  I suspect her headache and facial tingling are related to her nasal sore.  It looks like it may be developing into a cold sore.    labworkup reassuring.  She will follow up closely as outpatient.   Clinical Impression: 1. Chest pain, unspecified chest pain type       Serita Grit, MD 12/16/14 2042

## 2014-12-15 NOTE — Discharge Instructions (Signed)
Chest Wall Pain °Chest wall pain is pain in or around the bones and muscles of your chest. Sometimes, an injury causes this pain. Sometimes, the cause may not be known. This pain may take several weeks or longer to get better. °HOME CARE °Pay attention to any changes in your symptoms. Take these actions to help with your pain: °· Rest as told by your doctor. °· Avoid activities that cause pain. Try not to use your chest, belly (abdominal), or side muscles to lift heavy things. °· If directed, apply ice to the painful area: °¨ Put ice in a plastic bag. °¨ Place a towel between your skin and the bag. °¨ Leave the ice on for 20 minutes, 2-3 times per day. °· Take over-the-counter and prescription medicines only as told by your doctor. °· Do not use tobacco products, including cigarettes, chewing tobacco, and e-cigarettes. If you need help quitting, ask your doctor. °· Keep all follow-up visits as told by your doctor. This is important. °GET HELP IF: °· You have a fever. °· Your chest pain gets worse. °· You have new symptoms. °GET HELP RIGHT AWAY IF: °· You feel sick to your stomach (nauseous) or you throw up (vomit). °· You feel sweaty or light-headed. °· You have a cough with phlegm (sputum) or you cough up blood. °· You are short of breath. °  °This information is not intended to replace advice given to you by your health care provider. Make sure you discuss any questions you have with your health care provider. °  °Document Released: 08/07/2007 Document Revised: 11/09/2014 Document Reviewed: 05/16/2014 °Elsevier Interactive Patient Education ©2016 [employer] ° °

## 2014-12-15 NOTE — ED Provider Notes (Signed)
CSN: [id number]     Arrival date & time 12/15/14  0801 History   First MD Initiated Contact with Patient 12/15/14 [phone]     Chief Complaint  Patient presents with  . Chest Pain  . Back Pain  . Neck Pain  . Facial Pain  . Headache     (Consider location/radiation/quality/duration/timing/severity/associated sxs/prior Treatment) Patient is a 49 y.o. female presenting with chest pain, back pain, neck pain, and headaches. The history is provided by the patient.  Chest Pain Pain location:  L chest Pain quality: sharp   Pain radiates to the back: yes   Pain severity:  Moderate Onset quality:  Gradual Timing:  Intermittent Progression:  Waxing and waning Chronicity:  Recurrent Associated symptoms: abdominal pain, back pain, cough and headache   Associated symptoms: no fever, no nausea, no shortness of breath and not vomiting   Back Pain Associated symptoms: abdominal pain, chest pain and headaches   Associated symptoms: no dysuria and no fever   Neck Pain Associated symptoms: chest pain and headaches   Associated symptoms: no fever   Headache Associated symptoms: abdominal pain, back pain, cough and neck pain   Associated symptoms: no diarrhea, no fever, no nausea and no vomiting    Ms. Passe is a 49 yo F PMH anxiety, atypical chest pain, GERD and murmur and is presenting with chest pain. She is having central and left sided chest pain. Pain has been occuring intermittently for about a week. Pain is 6/10, sharp and not related to activity. Pain occurs when she is at rest. Worse with sitting up and after eating. Ibuprofen helps the pain. Denies any nausea, vomiting, SOB, constipation, diarrhea, fevers, chills, dysuria, rashes, joint pain out of the ordinary. Endorses cough, abdominal pain, and back pain.   Having left face numbness. Has occurred since last night and lasted through today with no resolution. Denies any numbness in her extremities. Hx of heart murmur with normal ECHO in  2012. Has had normal speech. Denies any trauma or injury to her head.   Past Medical History  Diagnosis Date  . Anemia   . Anxiety   . GERD (gastroesophageal reflux disease)   . Obesity   . Hemorrhoids   . Anemia   . Fibroids   . Heart murmur    History reviewed. No pertinent past surgical history. Family History  Problem Relation Age of Onset  . Colon cancer Maternal Uncle   . Breast cancer Maternal Aunt   . Heart disease Maternal Aunt   . Pancreatic cancer Maternal Grandmother   . Cancer Maternal Grandmother     pancreas  . Diabetes Maternal Uncle   . Hypertension Mother   . Renal Disease Mother    Social History  Substance Use Topics  . Smoking status: Never Smoker   . Smokeless tobacco: Never Used  . Alcohol Use: Yes     Comment: occasionally   OB History    Gravida Para Term Preterm AB TAB SAB Ectopic Multiple Living   1 1 1       1      Review of Systems  Constitutional: Negative for fever and chills.  Respiratory: Positive for cough. Negative for shortness of breath.   Cardiovascular: Positive for chest pain.  Gastrointestinal: Positive for abdominal pain. Negative for nausea, vomiting, diarrhea and constipation.  Genitourinary: Negative for dysuria.  Musculoskeletal: Positive for back pain and neck pain. Negative for joint swelling.  Skin: Negative for rash.  Neurological: Positive for headaches. Negative  for syncope.      Allergies  Food; Shellfish allergy; and Flagyl  Home Medications   Prior to Admission medications   Medication Sig Start Date End Date Taking? Authorizing Provider  aspirin 325 MG tablet Take 650 mg by mouth every other day.    Yes Historical Provider, MD  Cholecalciferol (VITAMIN D) 2000 UNITS CAPS Take 2,000 Units by mouth daily.    Yes Historical Provider, MD  ferrous sulfate 325 (65 FE) MG tablet Take 325 mg by mouth daily.    Yes Historical Provider, MD  ibuprofen (ADVIL,MOTRIN) 800 MG tablet Take 400 mg by mouth 2 (two)  times daily as needed for moderate pain.    Yes Historical Provider, MD  acetaminophen (TYLENOL) 500 MG tablet Take 500-1,000 mg by mouth every 6 (six) hours as needed for mild pain.    Historical Provider, MD  amoxicillin (AMOXIL) 500 MG capsule Take 1 capsule (500 mg total) by mouth 3 (three) times daily. Patient not taking: Reported on 12/15/2014 01/08/14   Delos Haring, PA-C  aspirin-sod bicarb-citric acid (ALKA-SELTZER) 325 MG TBEF tablet Take 325 mg by mouth daily as needed (cold symptoms).    Historical Provider, MD  benzonatate (TESSALON) 100 MG capsule Take 1 capsule (100 mg total) by mouth every 8 (eight) hours. Patient not taking: Reported on 12/15/2014 04/23/14   Noland Fordyce, PA-C  guaiFENesin (ROBITUSSIN) 100 MG/5ML liquid Take 5-10 mLs (100-200 mg total) by mouth every 4 (four) hours as needed for cough. Patient not taking: Reported on 12/15/2014 04/23/14   Noland Fordyce, PA-C  oxyCODONE-acetaminophen (PERCOCET) 5-325 MG per tablet Take 1 tablet by mouth every 4 (four) hours as needed. Patient not taking: Reported on 12/15/2014 02/19/13   Maude Leriche, PA-C  predniSONE (DELTASONE) 20 MG tablet 3 tabs po day one, then 2 po daily x 4 days Patient not taking: Reported on 12/15/2014 04/23/14   Noland Fordyce, PA-C  traMADol (ULTRAM) 50 MG tablet Take 1 tablet (50 mg total) by mouth every 6 (six) hours as needed. Patient not taking: Reported on 12/15/2014 01/08/14   Delos Haring, PA-C   BP 101/61 mmHg  Pulse 63  Temp(Src) 98.5 F (36.9 C) (Oral)  Resp 17  SpO2 100%  LMP 11/03/2014 (Exact Date) Physical Exam  Constitutional: She is oriented to person, place, and time. She appears well-developed and well-nourished.  HENT:  Head: Normocephalic and atraumatic.  Eyes: Conjunctivae and EOM are normal. Pupils are equal, round, and reactive to light.  Neck: Normal range of motion. Neck supple.  Cardiovascular: Normal rate, regular rhythm and intact distal pulses.  Exam reveals no  friction rub.   Murmur heard. Pulmonary/Chest: Effort normal and breath sounds normal. No respiratory distress. She has no wheezes. She has no rales.  Abdominal: Soft. Bowel sounds are normal. She exhibits no distension. There is no tenderness. There is no rebound.  Musculoskeletal: Normal range of motion. She exhibits no edema.  Neurological: She is alert and oriented to person, place, and time. No cranial nerve deficit.  Skin: Skin is warm. No rash noted.  Psychiatric: She has a normal mood and affect.    ED Course  Procedures (including critical care time) Labs Review Labs Reviewed  BASIC METABOLIC PANEL - Abnormal; Notable for the following:    Glucose, Bld 101 (*)    All other components within normal limits  CBC - Abnormal; Notable for the following:    Hemoglobin 9.6 (*)    HCT 31.7 (*)    MCH 23.8 (*)  RDW 17.9 (*)    All other components within normal limits  I-STAT TROPOININ, ED    Imaging Review Dg Chest 2 View  12/15/2014  CLINICAL DATA:  Chest pain EXAM: CHEST  2 VIEW COMPARISON:  April 23, 2014 FINDINGS: There is mild scarring in the left lower lobe region. There is no edema or consolidation. Heart is upper normal in size with pulmonary vascularity within normal limits. No adenopathy. No pneumothorax. No bone lesions. IMPRESSION: Stable mild scarring on the left.  No edema or consolidation. Electronically Signed   By: Lowella Grip III M.D.   On: 12/15/2014 09:06   I have personally reviewed and evaluated these images and lab results as part of my medical decision-making.   EKG Interpretation   Date/Time:  Thursday December 15 2014 08:07:49 EDT Ventricular Rate:  78 PR Interval:  151 QRS Duration: 75 QT Interval:  380 QTC Calculation: 433 R Axis:   88 Text Interpretation:  Sinus rhythm Minimal ST depression, inferior leads  Baseline wander in lead(s) II III aVF V3 V6 No significant change was  found Confirmed by [facility]  MD, TREY ([id number]) on 12/15/2014  9:42:29 AM      Medications  ibuprofen (ADVIL,MOTRIN) tablet 600 mg (600 mg Oral Given 12/15/14 1003)    MDM   Final diagnoses:  Chest pain, unspecified chest pain type   Ms. Xu is presenting with chest pain. Heart score of 2 (obesity and age). Was given ibuprofen and had relief of her pain.  Pain either 2/2 reflux vs costochondritis vs anxiety.  Advised to follow up with her PCP.  Left sided facial numbness with normal neurological exam and does have murmur but Normal ECHO EF 60% in 2012 and ABCD2 score of 2. Patient stable for discharge.   Rosemarie Ax, MD PGY-3, [facility] 12/15/2014, 11:41 AM     Rosemarie Ax, MD 12/15/14 Bryan, MD 12/16/14 2042

## 2014-12-15 NOTE — ED Notes (Signed)
Pt returned from scans. Monitored by pulse ox, bp cuff, and 5-lead. [place] MD at bedside.

## 2015-02-26 ENCOUNTER — Encounter (HOSPITAL_COMMUNITY): Payer: Self-pay | Admitting: Vascular Surgery

## 2015-02-26 ENCOUNTER — Observation Stay (HOSPITAL_COMMUNITY)
Admission: EM | Admit: 2015-02-26 | Discharge: 2015-02-27 | Disposition: A | Discharge status: Other Acute Inpatient Hospital | Payer: Self-pay | Attending: Family Medicine | Admitting: Family Medicine

## 2015-02-26 DIAGNOSIS — N939 Abnormal uterine and vaginal bleeding, unspecified: Secondary | ICD-10-CM

## 2015-02-26 DIAGNOSIS — D62 Acute posthemorrhagic anemia: Secondary | ICD-10-CM | POA: Insufficient documentation

## 2015-02-26 DIAGNOSIS — D649 Anemia, unspecified: Secondary | ICD-10-CM | POA: Diagnosis present

## 2015-02-26 LAB — CBC
HCT: 18.5 % — ABNORMAL LOW (ref 36.0–46.0)
Hemoglobin: 5.8 g/dL — CL (ref 12.0–15.0)
MCH: 27.4 pg (ref 26.0–34.0)
MCHC: 31.4 g/dL (ref 30.0–36.0)
MCV: 87.3 fL (ref 78.0–100.0)
PLATELETS: 297 10*3/uL (ref 150–400)
RBC: 2.12 MIL/uL — ABNORMAL LOW (ref 3.87–5.11)
RDW: 17.2 % — AB (ref 11.5–15.5)
WBC: 6.5 10*3/uL (ref 4.0–10.5)

## 2015-02-26 LAB — COMPREHENSIVE METABOLIC PANEL
ALT: 11 U/L — AB (ref 14–54)
AST: 21 U/L (ref 15–41)
Albumin: 3 g/dL — ABNORMAL LOW (ref 3.5–5.0)
Alkaline Phosphatase: 44 U/L (ref 38–126)
Anion gap: 7 (ref 5–15)
BUN: 9 mg/dL (ref 6–20)
CHLORIDE: 108 mmol/L (ref 101–111)
CO2: 24 mmol/L (ref 22–32)
CREATININE: 0.97 mg/dL (ref 0.44–1.00)
Calcium: 9.7 mg/dL (ref 8.9–10.3)
GFR calc Af Amer: >60 mL/min (ref >60)
GFR calc non Af Amer: >60 mL/min (ref >60)
GLUCOSE: 115 mg/dL — AB (ref 65–99)
Potassium: 3.8 mmol/L (ref 3.5–5.1)
SODIUM: 139 mmol/L (ref 135–145)
Total Bilirubin: 0.3 mg/dL (ref 0.3–1.2)
Total Protein: 5.4 g/dL — ABNORMAL LOW (ref 6.5–8.1)

## 2015-02-26 LAB — ABO/RH: ABO/RH(D): A POS

## 2015-02-26 LAB — PREPARE RBC (CROSSMATCH)

## 2015-02-26 MED ORDER — ONDANSETRON HCL 4 MG PO TABS: 4.0000 mg | ORAL_TABLET | Freq: Four times a day (QID) | ORAL | Status: DC | PRN

## 2015-02-26 MED ORDER — ASPIRIN 325 MG PO TABS
650.0000 mg | ORAL_TABLET | Freq: Every day | ORAL | Status: DC | PRN
  Filled 2015-02-26: qty 2

## 2015-02-26 MED ORDER — ONDANSETRON HCL 4 MG/2ML IJ SOLN: 4.0000 mg | Freq: Four times a day (QID) | INTRAMUSCULAR | Status: DC | PRN

## 2015-02-26 MED ORDER — ALUM & MAG HYDROXIDE-SIMETH 200-200-20 MG/5ML PO SUSP: 30.0000 mL | ORAL | Status: DC | PRN

## 2015-02-26 MED ORDER — PRENATAL MULTIVITAMIN CH: 1.0000 | ORAL_TABLET | Freq: Every day | ORAL | Status: DC

## 2015-02-26 MED ORDER — SODIUM CHLORIDE 0.9 % IV SOLN
Freq: Once | INTRAVENOUS | Status: AC
  Administered 2015-02-26: 23:00:00 via INTRAVENOUS

## 2015-02-26 MED ORDER — ZOLPIDEM TARTRATE 5 MG PO TABS: 5.0000 mg | ORAL_TABLET | Freq: Every evening | ORAL | Status: DC | PRN

## 2015-02-26 MED ORDER — MEGESTROL ACETATE 40 MG PO TABS
80.0000 mg | ORAL_TABLET | Freq: Two times a day (BID) | ORAL | Status: DC
  Administered 2015-02-26 – 2015-02-27 (×2): 80 mg via ORAL
  Filled 2015-02-26 (×2): qty 2

## 2015-02-26 MED ORDER — DOCUSATE SODIUM 100 MG PO CAPS
100.0000 mg | ORAL_CAPSULE | Freq: Two times a day (BID) | ORAL | Status: DC
Start: 2015-02-26 — End: 2015-02-27
  Administered 2015-02-26 – 2015-02-27 (×2): 100 mg via ORAL
  Filled 2015-02-26 (×2): qty 1

## 2015-02-26 MED ORDER — SODIUM CHLORIDE 0.9 % IV BOLUS (SEPSIS)
1000.0000 mL | Freq: Once | INTRAVENOUS | Status: AC
  Administered 2015-02-26: 1000 mL via INTRAVENOUS

## 2015-02-26 MED ORDER — ACETAMINOPHEN 325 MG PO TABS
650.0000 mg | ORAL_TABLET | Freq: Once | ORAL | Status: AC
  Administered 2015-02-26: 650 mg via ORAL
  Filled 2015-02-26: qty 2

## 2015-02-26 MED ORDER — MEGESTROL ACETATE 40 MG PO TABS
80.0000 mg | ORAL_TABLET | Freq: Every day | ORAL | Status: DC
  Administered 2015-02-26: 80 mg via ORAL
  Filled 2015-02-26 (×2): qty 2

## 2015-02-26 MED ORDER — DIPHENHYDRAMINE HCL 25 MG PO CAPS
25.0000 mg | ORAL_CAPSULE | Freq: Once | ORAL | Status: AC
Start: 2015-02-26 — End: 2015-02-26
  Administered 2015-02-26: 25 mg via ORAL
  Filled 2015-02-26: qty 1

## 2015-02-26 MED ORDER — FERROUS SULFATE 325 (65 FE) MG PO TABS
325.0000 mg | ORAL_TABLET | Freq: Every day | ORAL | Status: DC
  Administered 2015-02-26 – 2015-02-27 (×2): 325 mg via ORAL
  Filled 2015-02-26 (×2): qty 1

## 2015-02-26 MED ORDER — SODIUM CHLORIDE 0.9 % IV SOLN
10.0000 mL/h | Freq: Once | INTRAVENOUS | Status: AC
  Administered 2015-02-26: 10 mL/h via INTRAVENOUS

## 2015-02-26 NOTE — H&P (Signed)
[facility] History and Physical  Theresa Peterson [id number] DOB: 01-01-1966 DOA: 02/26/2015  Referring physician: Dr Darl Householder, ED physician PCP: Theresa Must, MD   Chief Complaint: Dizziness, vaginal bleeding  HPI: Theresa Peterson is a 49 y.o. female  With a history of fibroids and anemia.  Was seen at [facility] ED for heavy vaginal bleeding that started 4 days ago.  She has been having dizziness and lightheadedness when ambulating and improved with sitting down.  Menses have been irregular since October.  Had workup by her OB/gyn through the [facility] hospital, which revealed that she was close to menopause and her doctor prescribed her OCPs, which she started Friday.  Has between 2-14 days in between menses.  Has to use tampon with maxipad and changes the tampon every hour.   Review of Systems:   Pt complains of dizziness, lightheadedness.  Pt denies any fevers, chills, nausea, vomiting, diarrhea, constipation, abdominal pain, shortness of breath, dyspnea on exertion, orthopnea, cough, wheezing, palpitations, headache, vision changes, lightheadedness, dizziness, diarrhea, constipation, melena, rectal bleeding.  Review of systems are otherwise negative  Past Medical History  Diagnosis Date  . Anemia   . Anxiety   . GERD (gastroesophageal reflux disease)   . Obesity   . Hemorrhoids   . Anemia   . Fibroids   . Heart murmur    History reviewed. No pertinent past surgical history. Social History:  reports that she has never smoked. She has never used smokeless tobacco. She reports that she drinks alcohol. She reports that she does not use illicit drugs. Patient lives at home & is able to participate in activities of daily living  Allergies  Allergen Reactions  . Food Anaphylaxis    Shellfish  . Shellfish Allergy Anaphylaxis  . Flagyl [Metronidazole Hcl] Nausea Only    Reaction=flu like symptoms    Family History  Problem Relation Age of Onset  .  Colon cancer Maternal Uncle   . Breast cancer Maternal Aunt   . Heart disease Maternal Aunt   . Pancreatic cancer Maternal Grandmother   . Cancer Maternal Grandmother     pancreas  . Diabetes Maternal Uncle   . Hypertension Mother   . Renal Disease Mother       Prior to Admission medications   Medication Sig Start Date End Date Taking? Authorizing Provider  acetaminophen (TYLENOL) 500 MG tablet Take 500-1,000 mg by mouth every 6 (six) hours as needed for mild pain.   Yes Historical Provider, MD  aspirin 325 MG tablet Take 650 mg by mouth daily as needed for mild pain or headache.    Yes Historical Provider, MD  bismuth subsalicylate (PEPTO BISMOL) 262 MG/15ML suspension Take 30 mLs by mouth every 6 (six) hours as needed for indigestion.   Yes Historical Provider, MD  Cholecalciferol (VITAMIN D) 2000 UNITS CAPS Take 2,000 Units by mouth daily.    Yes Historical Provider, MD  desogestrel-ethinyl estradiol (APRI,EMOQUETTE,SOLIA) 0.15-30 MG-MCG tablet Take 1 tablet by mouth daily.   Yes Historical Provider, MD  ferrous sulfate 325 (65 FE) MG tablet Take 325 mg by mouth daily.    Yes Historical Provider, MD    Physical Exam: BP 107/66 mmHg  Pulse 96  Temp(Src) 98.4 F (36.9 C) (Oral)  Resp 16  SpO2 100%  General: Middle age black female. Awake and alert and oriented x3. No acute cardiopulmonary distress.  Eyes: Pupils equal, round, reactive to light. Extraocular muscles are intact. Sclerae anicteric and noninjected.  ENT:  Moist mucosal membranes. No mucosal lesions. Teeth in good repair  Neck: Neck supple without lymphadenopathy. No carotid bruits. No masses palpated.  Cardiovascular: Regular rate with normal S1-S2 sounds. No murmurs, rubs, gallops auscultated. No JVD.  Respiratory: Good respiratory effort with no wheezes, rales, rhonchi. Lungs clear to auscultation bilaterally.  Abdomen: Soft, nontender, nondistended. Active bowel sounds. No masses or hepatosplenomegaly  Skin: Dry,  warm to touch. 2+ dorsalis pedis and radial pulses. Musculoskeletal: No calf or leg pain. All major joints not erythematous nontender.  Psychiatric: Intact judgment and insight.  Neurologic: No focal neurological deficits. Cranial nerves II through XII are grossly intact.           Labs on Admission:  Basic Metabolic Panel:  Recent Labs Lab 02/26/15 1439  NA 139  K 3.8  CL 108  CO2 24  GLUCOSE 115*  BUN 9  CREATININE 0.97  CALCIUM 9.7   Liver Function Tests:  Recent Labs Lab 02/26/15 1439  AST 21  ALT 11*  ALKPHOS 44  BILITOT 0.3  PROT 5.4*  ALBUMIN 3.0*   No results for input(s): LIPASE, AMYLASE in the last 168 hours. No results for input(s): AMMONIA in the last 168 hours. CBC:  Recent Labs Lab 02/26/15 1439  WBC 6.5  HGB 5.8*  HCT 18.5*  MCV 87.3  PLT 297   Cardiac Enzymes: No results for input(s): CKTOTAL, CKMB, CKMBINDEX, TROPONINI in the last 168 hours.  BNP (last 3 results) No results for input(s): BNP in the last 8760 hours.  ProBNP (last 3 results) No results for input(s): PROBNP in the last 8760 hours.  CBG: No results for input(s): GLUCAP in the last 168 hours.  Radiological Exams on Admission: No results found.   Assessment/Plan Present on Admission:  . Symptomatic anemia . Vaginal bleeding  This patient was discussed with the ED physician, including pertinent vitals, physical exam findings, labs, and imaging.  We also discussed care given by the ED provider.  1.  Symptomatic Anemia  Observation  Will need to recollect type and screen.  Transfuse 2 unts - has already had 1 unit  Recheck CBC in AM 2.  Vaginal bleeding - has fibroids, which are the likely etiology.    Megace 80mg  BID  Likely D/C to home tomorrow.  DVT prophylaxis: SCDs  Consultants: none  Code Status: full  Family Communication: none    Truett Mainland, [place] [phone]

## 2015-02-26 NOTE — Progress Notes (Signed)
This note also relates to the following rows which could not be included: Rate - Cannot attach notes to extension rows  Blood transfusion completed prior to arrival of patient by [facility].

## 2015-02-26 NOTE — ED Notes (Signed)
Lab called with critical Hgb 5.8.  Reported to Dr. Lacinda Axon.

## 2015-02-26 NOTE — ED Provider Notes (Signed)
CSN: [id number]     Arrival date & time 02/26/15  1353 History   First MD Initiated Contact with Patient 02/26/15 1517     Chief Complaint  Patient presents with  . Vaginal Bleeding     (Consider location/radiation/quality/duration/timing/severity/associated sxs/prior Treatment) The history is provided by the patient.  Theresa Peterson is a 49 y.o. female hx of anemia, anxiety, fibroids here with dizziness, vaginal bleeding. Patient states that she has been having irregular periods for about 2 months. Her period stopped 2 weeks ago. About 4 days ago, she had worsening bleeding. She states that she had about a pad per hour per day. Has worsening lower abdominal cramps. Denies vomiting. She feels dizzy and light headed and felt like passing out. Denies chest pain or shortness of breath.    Past Medical History  Diagnosis Date  . Anemia   . Anxiety   . GERD (gastroesophageal reflux disease)   . Obesity   . Hemorrhoids   . Anemia   . Fibroids   . Heart murmur    History reviewed. No pertinent past surgical history. Family History  Problem Relation Age of Onset  . Colon cancer Maternal Uncle   . Breast cancer Maternal Aunt   . Heart disease Maternal Aunt   . Pancreatic cancer Maternal Grandmother   . Cancer Maternal Grandmother     pancreas  . Diabetes Maternal Uncle   . Hypertension Mother   . Renal Disease Mother    Social History  Substance Use Topics  . Smoking status: Never Smoker   . Smokeless tobacco: Never Used  . Alcohol Use: Yes     Comment: occasionally   OB History    Gravida Para Term Preterm AB TAB SAB Ectopic Multiple Living   1 1 1       1      Review of Systems  Genitourinary: Positive for vaginal bleeding.  Neurological: Positive for dizziness.  All other systems reviewed and are negative.     Allergies  Food; Shellfish allergy; and Flagyl  Home Medications   Prior to Admission medications   Medication Sig Start Date End Date Taking?  Authorizing Provider  acetaminophen (TYLENOL) 500 MG tablet Take 500-1,000 mg by mouth every 6 (six) hours as needed for mild pain.    Historical Provider, MD  amoxicillin (AMOXIL) 500 MG capsule Take 1 capsule (500 mg total) by mouth 3 (three) times daily. Patient not taking: Reported on 12/15/2014 01/08/14   Delos Haring, PA-C  aspirin 325 MG tablet Take 650 mg by mouth every other day.     Historical Provider, MD  aspirin-sod bicarb-citric acid (ALKA-SELTZER) 325 MG TBEF tablet Take 325 mg by mouth daily as needed (cold symptoms).    Historical Provider, MD  benzonatate (TESSALON) 100 MG capsule Take 1 capsule (100 mg total) by mouth every 8 (eight) hours. Patient not taking: Reported on 12/15/2014 04/23/14   Noland Fordyce, PA-C  Cholecalciferol (VITAMIN D) 2000 UNITS CAPS Take 2,000 Units by mouth daily.     Historical Provider, MD  ferrous sulfate 325 (65 FE) MG tablet Take 325 mg by mouth daily.     Historical Provider, MD  guaiFENesin (ROBITUSSIN) 100 MG/5ML liquid Take 5-10 mLs (100-200 mg total) by mouth every 4 (four) hours as needed for cough. Patient not taking: Reported on 12/15/2014 04/23/14   Noland Fordyce, PA-C  ibuprofen (ADVIL,MOTRIN) 800 MG tablet Take 400 mg by mouth 2 (two) times daily as needed for moderate pain.  Historical Provider, MD  oxyCODONE-acetaminophen (PERCOCET) 5-325 MG per tablet Take 1 tablet by mouth every 4 (four) hours as needed. Patient not taking: Reported on 12/15/2014 02/19/13   Maude Leriche, PA-C  predniSONE (DELTASONE) 20 MG tablet 3 tabs po day one, then 2 po daily x 4 days Patient not taking: Reported on 12/15/2014 04/23/14   Noland Fordyce, PA-C  traMADol (ULTRAM) 50 MG tablet Take 1 tablet (50 mg total) by mouth every 6 (six) hours as needed. Patient not taking: Reported on 12/15/2014 01/08/14   Delos Haring, PA-C   BP 122/79 mmHg  Pulse 112  Temp(Src) 98.3 F (36.8 C) (Oral)  Resp 16  SpO2 100% Physical Exam  Constitutional: She is  oriented to person, place, and time.  Pale   HENT:  Head: Normocephalic.  Eyes: Pupils are equal, round, and reactive to light.  Pale   Neck: Normal range of motion. Neck supple.  Cardiovascular: Regular rhythm and normal heart sounds.   Tachycardic   Pulmonary/Chest: Effort normal and breath sounds normal. No respiratory distress. She has no wheezes. She has no rales.  Abdominal: Soft. Bowel sounds are normal.  + fibroids, minimally tender   Musculoskeletal: Normal range of motion. She exhibits no edema or tenderness.  Neurological: She is alert and oriented to person, place, and time.  Skin: Skin is warm and dry.  Psychiatric: She has a normal mood and affect. Her behavior is normal. Thought content normal.  Nursing note and vitals reviewed.   ED Course  Procedures (including critical care time)  CRITICAL CARE Performed by: Darl Householder, Licet Dunphy   Total critical care time: 30 minutes  Critical care time was exclusive of separately billable procedures and treating other patients.  Critical care was necessary to treat or prevent imminent or life-threatening deterioration.  Critical care was time spent personally by me on the following activities: development of treatment plan with patient and/or surrogate as well as nursing, discussions with consultants, evaluation of patient's response to treatment, examination of patient, obtaining history from patient or surrogate, ordering and performing treatments and interventions, ordering and review of laboratory studies, ordering and review of radiographic studies, pulse oximetry and re-evaluation of patient's condition.   Labs Review Labs Reviewed  COMPREHENSIVE METABOLIC PANEL - Abnormal; Notable for the following:    Glucose, Bld 115 (*)    Total Protein 5.4 (*)    Albumin 3.0 (*)    ALT 11 (*)    All other components within normal limits  CBC - Abnormal; Notable for the following:    RBC 2.12 (*)    Hemoglobin 5.8 (*)    HCT 18.5 (*)     RDW 17.2 (*)    All other components within normal limits  TYPE AND SCREEN  PREPARE RBC (CROSSMATCH)    Imaging Review No results found. I have personally reviewed and evaluated these images and lab results as part of my medical decision-making.   EKG Interpretation None      MDM   Final diagnoses:  None   Theresa Peterson is a 49 y.o. female here with vaginal bleeding. Appears pale, tachy, likely has symptomatic anemia from vaginal bleeding. Will get labs, type.   3:58 PM Hg 5.8, patient already on iron. I called Dr. Nehemiah Settle from [facility]. She is ordered 2 U PRBC. He recommend megace and will admit to [facility] hospital.      Wandra Arthurs, MD 02/26/15 [phone]

## 2015-02-26 NOTE — ED Notes (Signed)
Pt reports to the ED for eval of possible anemia. Pt reports she has been having very heavy vaginal bleeding ever 2 weeks since October and she believes her blood levels are low. Pt reports she was prescribed birth control but she just started taking it yesterday. Pt reports she has SOB with minimal exertion, fatigue, and intermittent HAs. Pt A&OX4, resp e/u, and skin warm and dry.

## 2015-02-27 DIAGNOSIS — D649 Anemia, unspecified: Secondary | ICD-10-CM

## 2015-02-27 DIAGNOSIS — N939 Abnormal uterine and vaginal bleeding, unspecified: Principal | ICD-10-CM

## 2015-02-27 LAB — TYPE AND SCREEN
ABO/RH(D): A POS
ANTIBODY SCREEN: NEGATIVE
UNIT DIVISION: 0
Unit division: 0

## 2015-02-27 LAB — CBC
HCT: 23.8 % — ABNORMAL LOW (ref 36.0–46.0)
HEMOGLOBIN: 7.6 g/dL — AB (ref 12.0–15.0)
MCH: 27.7 pg (ref 26.0–34.0)
MCHC: 31.9 g/dL (ref 30.0–36.0)
MCV: 86.9 fL (ref 78.0–100.0)
Platelets: 238 10*3/uL (ref 150–400)
RBC: 2.74 MIL/uL — AB (ref 3.87–5.11)
RDW: 16.6 % — ABNORMAL HIGH (ref 11.5–15.5)
WBC: 5.6 10*3/uL (ref 4.0–10.5)

## 2015-02-27 MED ORDER — MEGESTROL ACETATE 40 MG PO TABS: 80.0000 mg | ORAL_TABLET | Freq: Two times a day (BID) | ORAL | Status: DC

## 2015-02-27 NOTE — Progress Notes (Signed)
Reviewed discharge instructions with patient and reasons to call physician.  IV removed upon discharge, no equipment sent home with patient.  All belongings were at the bedside and sent home with patient.  Stable condition upon discharge. Wheeled out to the front entrance by NT.  All questions answered.

## 2015-02-27 NOTE — Discharge Summary (Signed)
Physician Discharge Summary  Patient ID: Theresa Peterson MRN: [id number] DOB/AGE: 11-22-1965 49 y.o.  Admit date: 02/26/2015 Discharge date: 02/27/2015  Admission Diagnoses: 1. Symptomatic Anemia 2. Vaginal Bleeding.  Discharge Diagnoses:  Active Problems:   Symptomatic anemia   Vaginal bleeding   Discharged Condition: good  Hospital Course: Patient was admitted due to symptomatic anemia and vaginal bleeding. Patient was started on Megace and received 3 units of packed red blood cells. Patient has minimal bleeding this morning and has steady gait and ambulating without difficulty. She will continue on the Megace and follow-up with her primary care provider in 2 weeks. Patient was instructed to return to the mature emissions unit should her vaginal bleeding increase or has other symptoms of anemia, which were reviewed with the patient.  Consults: None  Significant Diagnostic Studies: labs: Hemoglobin of 5.8 on admission and 7.6 on discharge   Treatmen Blood transfusion  Discharge Exam: Blood pressure 107/76, pulse 93, temperature 98.6 F (37 C), temperature source Oral, resp. rate 17, height 5\' 2"  (1.575 m), weight 232 lb (105.235 kg), SpO2 100 %. General appearance: alert, cooperative and no distress Resp: clear to auscultation bilaterally Cardio: regular rate and rhythm, S1, S2 normal, no murmur, click, rub or gallop GI: soft, non-tender; bowel sounds normal; no masses,  no organomegaly Extremities: extremities normal, atraumatic, no cyanosis or edema  Disposition: 01-Home or Self Care  Discharge Instructions    Call MD for:  persistant nausea and vomiting    Complete by:  As directed      Call MD for:  redness, tenderness, or signs of infection (pain, swelling, redness, odor or green/yellow discharge around incision site)    Complete by:  As directed      Call MD for:  severe uncontrolled pain    Complete by:  As directed      Call MD for:  temperature >100.4     Complete by:  As directed      Diet - low sodium heart healthy    Complete by:  As directed      Increase activity slowly    Complete by:  As directed             Medication List    TAKE these medications        acetaminophen 500 MG tablet  Commonly known as:  TYLENOL  Take 500-1,000 mg by mouth every 6 (six) hours as needed for mild pain.     aspirin 325 MG tablet  Take 650 mg by mouth daily as needed for mild pain or headache.     bismuth subsalicylate [id number] suspension  Commonly known as:  PEPTO BISMOL  Take 30 mLs by mouth every 6 (six) hours as needed for indigestion.     desogestrel-ethinyl estradiol 0.15-30 MG-MCG tablet  Commonly known as:  APRI,EMOQUETTE,SOLIA  Take 1 tablet by mouth daily.     ferrous sulfate 325 (65 FE) MG tablet  Take 325 mg by mouth daily.     megestrol 40 MG tablet  Commonly known as:  MEGACE  Take 2 tablets (80 mg total) by mouth 2 (two) times daily.     Vitamin D 2000 UNITS Caps  Take 2,000 Units by mouth daily.           Follow-up Information    Follow up with Tennis Must, MD In 2 weeks.   Specialty:  Family Medicine   Contact information:   [address] [place] [address] [phone]  SignedLoma Boston JEHIEL 02/27/2015, 7:59 AM

## 2015-02-27 NOTE — Discharge Instructions (Signed)
Abnormal Uterine Bleeding °Abnormal uterine bleeding means bleeding from the vagina that is not your normal menstrual period. This can be: °· Bleeding or spotting between periods. °· Bleeding after sex (sexual intercourse). °· Bleeding that is heavier or more than normal. °· Periods that last longer than usual. °· Bleeding after menopause. °There are many problems that may cause this. Treatment will depend on the cause of the bleeding. Any kind of bleeding that is not normal should be reviewed by your doctor.  °HOME CARE °Watch your condition for any changes. These actions may lessen any discomfort you are having: °· Do not use tampons or douches as told by your doctor. °· Change your pads often. °You should get regular pelvic exams and Pap tests. Keep all appointments for tests as told by your doctor. °GET HELP IF: °· You are bleeding for more than 1 week. °· You feel dizzy at times. °GET HELP RIGHT AWAY IF:  °· You pass out. °· You have to change pads every 15 to 30 minutes. °· You have belly pain. °· You have a fever. °· You become sweaty or weak. °· You are passing large blood clots from the vagina. °· You feel sick to your stomach (nauseous) and throw up (vomit). °MAKE SURE YOU: °· Understand these instructions. °· Will watch your condition. °· Will get help right away if you are not doing well or get worse. °  °This information is not intended to replace advice given to you by your health care provider. Make sure you discuss any questions you have with your health care provider. °  °Document Released: 12/16/2008 Document Revised: 02/23/2013 Document Reviewed: 09/17/2012 °Elsevier Interactive Patient Education ©2016 [employer] ° °

## 2015-02-28 LAB — TYPE AND SCREEN
ABO/RH(D): A POS
ANTIBODY SCREEN: NEGATIVE
UNIT DIVISION: 0
UNIT DIVISION: 0

## 2015-03-08 ENCOUNTER — Telehealth: Payer: Self-pay

## 2015-03-08 NOTE — Telephone Encounter (Signed)
axed patient's medical records to her house dated 1995 - 2007 - nothing in [place], all came from [facility] system - faxed to [phone] as requested

## 2015-05-13 ENCOUNTER — Emergency Department (HOSPITAL_COMMUNITY)
Admission: EM | Admit: 2015-05-13 | Discharge: 2015-05-13 | Disposition: A | Discharge status: Home / Self Care | Payer: Self-pay | Attending: Physician Assistant | Admitting: Physician Assistant

## 2015-05-13 ENCOUNTER — Emergency Department (HOSPITAL_COMMUNITY): Payer: Self-pay

## 2015-05-13 ENCOUNTER — Encounter (HOSPITAL_COMMUNITY): Payer: Self-pay

## 2015-05-13 DIAGNOSIS — R05 Cough: Secondary | ICD-10-CM | POA: Insufficient documentation

## 2015-05-13 DIAGNOSIS — R0789 Other chest pain: Secondary | ICD-10-CM | POA: Insufficient documentation

## 2015-05-13 DIAGNOSIS — R011 Cardiac murmur, unspecified: Secondary | ICD-10-CM | POA: Insufficient documentation

## 2015-05-13 DIAGNOSIS — Z8719 Personal history of other diseases of the digestive system: Secondary | ICD-10-CM | POA: Insufficient documentation

## 2015-05-13 DIAGNOSIS — Z7982 Long term (current) use of aspirin: Secondary | ICD-10-CM | POA: Insufficient documentation

## 2015-05-13 DIAGNOSIS — Z8659 Personal history of other mental and behavioral disorders: Secondary | ICD-10-CM | POA: Insufficient documentation

## 2015-05-13 DIAGNOSIS — Z79899 Other long term (current) drug therapy: Secondary | ICD-10-CM | POA: Insufficient documentation

## 2015-05-13 DIAGNOSIS — D649 Anemia, unspecified: Secondary | ICD-10-CM | POA: Insufficient documentation

## 2015-05-13 DIAGNOSIS — E669 Obesity, unspecified: Secondary | ICD-10-CM | POA: Insufficient documentation

## 2015-05-13 DIAGNOSIS — Z793 Long term (current) use of hormonal contraceptives: Secondary | ICD-10-CM | POA: Insufficient documentation

## 2015-05-13 DIAGNOSIS — L02414 Cutaneous abscess of left upper limb: Secondary | ICD-10-CM

## 2015-05-13 MED ORDER — LIDOCAINE-EPINEPHRINE (PF) 2 %-1:200000 IJ SOLN
10.0000 mL | Freq: Once | INTRAMUSCULAR | Status: AC
  Administered 2015-05-13: 10 mL
  Filled 2015-05-13: qty 20

## 2015-05-13 NOTE — ED Notes (Signed)
Pt. Reports having lt. Shoulder pain and she has an abscess  On her lt. Upper arm .  She reports the pain from her shoulder goes into her lt. Neck , arm and lt. Chest.  She also reports that she had flu like symptoms 2 weeks ago and she continues to have chest congestion and cough green mucous.  Pt. Denies any body aches.  Skin is warm and dry.

## 2015-05-13 NOTE — ED Provider Notes (Signed)
Medical screening examination/treatment/procedure(s) were performed by non-physician practitioner and as supervising physician I was immediately available for consultation/collaboration.   EKG Interpretation None      EKG done and shows T wave abnormality in lead V2 and V1.  And t wave inversion in lead 3.  These are all he same as an ekg from 02/08/2013.  No new changes.  No acute ischemia. Nl Qtc.   Jonasia Coiner Julio Alm, MD 05/13/15 1546

## 2015-05-13 NOTE — Discharge Instructions (Signed)
Abscess °An abscess is an infected area that contains a collection of pus and debris. It can occur in almost any part of the body. An abscess is also known as a furuncle or boil. °CAUSES  °An abscess occurs when tissue gets infected. This can occur from blockage of oil or sweat glands, infection of hair follicles, or a minor injury to the skin. As the body tries to fight the infection, pus collects in the area and creates pressure under the skin. This pressure causes pain. People with weakened immune systems have difficulty fighting infections and get certain abscesses more often.  °SYMPTOMS °Usually an abscess develops on the skin and becomes a painful mass that is red, warm, and tender. If the abscess forms under the skin, you may feel a moveable soft area under the skin. Some abscesses break open (rupture) on their own, but most will continue to get worse without care. The infection can spread deeper into the body and eventually into the bloodstream, causing you to feel ill.  °DIAGNOSIS  °Your caregiver will take your medical history and perform a physical exam. A sample of fluid may also be taken from the abscess to determine what is causing your infection. °TREATMENT  °Your caregiver may prescribe antibiotic medicines to fight the infection. However, taking antibiotics alone usually does not cure an abscess. Your caregiver may need to make a small cut (incision) in the abscess to drain the pus. In some cases, gauze is packed into the abscess to reduce pain and to continue draining the area. °HOME CARE INSTRUCTIONS  °· Only take over-the-counter or prescription medicines for pain, discomfort, or fever as directed by your caregiver. °· If you were prescribed antibiotics, take them as directed. Finish them even if you start to feel better. °· If gauze is used, follow your caregiver's directions for changing the gauze. °· To avoid spreading the infection: °· Keep your draining abscess covered with a  bandage. °· Wash your hands well. °· Do not share personal care items, towels, or whirlpools with others. °· Avoid skin contact with others. °· Keep your skin and clothes clean around the abscess. °· Keep all follow-up appointments as directed by your caregiver. °SEEK MEDICAL CARE IF:  °· You have increased pain, swelling, redness, fluid drainage, or bleeding. °· You have muscle aches, chills, or a general ill feeling. °· You have a fever. °MAKE SURE YOU:  °· Understand these instructions. °· Will watch your condition. °· Will get help right away if you are not doing well or get worse. °  °This information is not intended to replace advice given to you by your health care provider. Make sure you discuss any questions you have with your health care provider. °  °Document Released: 11/28/2004 Document Revised: 08/20/2011 Document Reviewed: 05/03/2011 °Elsevier Interactive Patient Education ©2016 [employer] ° °Incision and Drainage °Incision and drainage is a procedure in which a sac-like structure (cystic structure) is opened and drained. The area to be drained usually contains material such as pus, fluid, or blood.  °LET YOUR CAREGIVER KNOW ABOUT:  °· Allergies to medicine. °· Medicines taken, including vitamins, herbs, eyedrops, over-the-counter medicines, and creams. °· Use of steroids (by mouth or creams). °· Previous problems with anesthetics or numbing medicines. °· History of bleeding problems or blood clots. °· Previous surgery. °· Other health problems, including diabetes and kidney problems. °· Possibility of pregnancy, if this applies. °RISKS AND COMPLICATIONS °· Pain. °· Bleeding. °· Scarring. °· Infection. °BEFORE THE PROCEDURE  °  You may need to have an ultrasound or other imaging tests to see how large or deep your cystic structure is. Blood tests may also be used to determine if you have an infection or how severe the infection is. You may need to have a tetanus shot. PROCEDURE  The affected area  is cleaned with a cleaning fluid. The cyst area will then be numbed with a medicine (local anesthetic). A small incision will be made in the cystic structure. A syringe or catheter may be used to drain the contents of the cystic structure, or the contents may be squeezed out. The area will then be flushed with a cleansing solution. After cleansing the area, it is often gently packed with a gauze or another wound dressing. Once it is packed, it will be covered with gauze and tape or some other type of wound dressing. AFTER THE PROCEDURE   Often, you will be allowed to go home right after the procedure.  You may be given antibiotic medicine to prevent or heal an infection.  If the area was packed with gauze or some other wound dressing, you will likely need to come back in 1 to 2 days to get it removed.  The area should heal in about 14 days.   This information is not intended to replace advice given to you by your health care provider. Make sure you discuss any questions you have with your health care provider.  Follow up with your primary care provider as needed. You will need outpatient stress testing, please reschedule. Keep wound clean and dry. May wash with soap and water. Return to the emergency department if you experience any worsening of your pain, dizziness, loss of consciousness, difficulty breathing or fevers, chills, redness or swelling around the wound.

## 2015-05-13 NOTE — ED Provider Notes (Addendum)
CSN: [id number]     Arrival date & time 05/13/15  1202 History   First MD Initiated Contact with Patient 05/13/15 1350     Chief Complaint  Patient presents with  . Shoulder Pain     (Consider location/radiation/quality/duration/timing/severity/associated sxs/prior Treatment) HPI   Theresa Peterson is a 50 year old female with no significant past medical history presents the emergency department today complaining of abscess to left arm. Patient states that last weekend she noticed a small bump to her left deltoid which progressively got larger. Patient thinks the abscess may have popped because now it is not as big but she is been experiencing pain down her left arm, in her left anterior chest wall and her left neck. Pain is constant and is reproducible with palpation. No associated dizziness, paresthesias. Patient also states that she thinks she may have had the flu 2 weeks ago. At that time she was expressing fevers, myalgias and productive cough. Patient states that the productive cough remains and she is still coughing up green mucous daily. No continued fevers or difficulty breathing.   Past Medical History  Diagnosis Date  . Anemia   . Anxiety   . GERD (gastroesophageal reflux disease)   . Obesity   . Hemorrhoids   . Anemia   . Fibroids   . Heart murmur    History reviewed. No pertinent past surgical history. Family History  Problem Relation Age of Onset  . Colon cancer Maternal Uncle   . Breast cancer Maternal Aunt   . Heart disease Maternal Aunt   . Pancreatic cancer Maternal Grandmother   . Cancer Maternal Grandmother     pancreas  . Diabetes Maternal Uncle   . Hypertension Mother   . Renal Disease Mother    Social History  Substance Use Topics  . Smoking status: Never Smoker   . Smokeless tobacco: Never Used  . Alcohol Use: Yes     Comment: occasionally   OB History    Gravida Para Term Preterm AB TAB SAB Ectopic Multiple Living   1 1 1       1       Review of Systems  All other systems reviewed and are negative.     Allergies  Food; Shellfish allergy; and Flagyl  Home Medications   Prior to Admission medications   Medication Sig Start Date End Date Taking? Authorizing Provider  acetaminophen (TYLENOL) 500 MG tablet Take 500-1,000 mg by mouth every 6 (six) hours as needed for mild pain.    Historical Provider, MD  aspirin 325 MG tablet Take 650 mg by mouth daily as needed for mild pain or headache.     Historical Provider, MD  bismuth subsalicylate (PEPTO BISMOL) 262 MG/15ML suspension Take 30 mLs by mouth every 6 (six) hours as needed for indigestion.    Historical Provider, MD  Cholecalciferol (VITAMIN D) 2000 UNITS CAPS Take 2,000 Units by mouth daily.     Historical Provider, MD  desogestrel-ethinyl estradiol (APRI,EMOQUETTE,SOLIA) 0.15-30 MG-MCG tablet Take 1 tablet by mouth daily.    Historical Provider, MD  ferrous sulfate 325 (65 FE) MG tablet Take 325 mg by mouth daily.     Historical Provider, MD  megestrol (MEGACE) 40 MG tablet Take 2 tablets (80 mg total) by mouth 2 (two) times daily. 02/27/15   Tanna Savoy Stinson, DO   BP 107/85 mmHg  Pulse 96  Temp(Src) 98.5 F (36.9 C) (Oral)  Resp 18  Ht 5\' 4"  (1.626 m)  Wt 103.534 kg  BMI 39.16 kg/m2  SpO2 99%  LMP 05/07/2015 Physical Exam  Constitutional: She is oriented to person, place, and time. She appears well-developed and well-nourished. No distress.  HENT:  Head: Normocephalic and atraumatic.  Mouth/Throat: No oropharyngeal exudate.  Eyes: Conjunctivae and EOM are normal. Pupils are equal, round, and reactive to light. Right eye exhibits no discharge. Left eye exhibits no discharge. No scleral icterus.  Neck: Neck supple.  Cardiovascular: Normal rate, regular rhythm, normal heart sounds and intact distal pulses.  Exam reveals no gallop and no friction rub.   No murmur heard. Pulmonary/Chest: Effort normal and breath sounds normal. No respiratory distress. She has  no wheezes. She has no rales. She exhibits tenderness (eft anterior chest wall).  Abdominal: Soft. She exhibits no distension. There is no tenderness. There is no guarding.  Musculoskeletal: Normal range of motion. She exhibits no edema.  Lymphadenopathy:    She has no cervical adenopathy.  Neurological: She is alert and oriented to person, place, and time.  Skin: Skin is warm and dry. No rash noted. She is not diaphoretic. No erythema. No pallor.  1 cm area of fluctuation to left upper arm. Mild surrounding erythema. No active drainage. No streaking.  Psychiatric: She has a normal mood and affect. Her behavior is normal.  Nursing note and vitals reviewed.   ED Course  Procedures (including critical care time)  INCISION AND DRAINAGE Performed by: Carlos Levering Consent: Verbal consent obtained. Risks and benefits: risks, benefits and alternatives were discussed Type: abscess  Body area: left upper arm  Anesthesia: local infiltration  Incision was made with a scalpel.  Local anesthetic: lidocaine 1% with epinephrine  Anesthetic total: 3 ml  Complexity: complex Blunt dissection to break up loculations  Drainage: purulent  Drainage amount: minimal   Patient tolerance: Patient tolerated the procedure well with no immediate complications.    Labs Review Labs Reviewed - No data to display  Imaging Review Dg Chest 2 View  05/13/2015  CLINICAL DATA:  Productive cough x2 weeks EXAM: CHEST  2 VIEW COMPARISON:  12/15/2014 FINDINGS: Mild lingular scarring. No focal consolidation. No pleural effusion or pneumothorax. The heart is normal in size. Visualized osseous structures are within normal limits. IMPRESSION: No evidence of acute cardiopulmonary disease. Electronically Signed   By: Julian Hy M.D.   On: 05/13/2015 14:31   I have personally reviewed and evaluated these images and lab results as part of my medical decision-making.   EKG Interpretation None       MDM   Final diagnoses:  Abscess of arm, left    50 year old female with no significant past medical history presents to the ED complaining of abscess to her left upper arm onset 2 weeks ago. Over the last week patient is now having soreness to her left anterior chest wall and down her left arm patient feels that is associated with her abscess. Patient is in no apparent distress. All vital signs stable. Chest pain is reproducible with palpation to anterior chest wall and deltoid muscle. Patient has long history of atypical chest pain which has been worked up by outpatient cardiology and has had normal stress test and echocardiograms. No known history of heart disease found on her extensive workups. EKG today is unchanged from her previous ones over the last 2 years. Chest x-ray negative. Patient is low risk heart score. Low suspicion ACS. Abscess was amenable to incision and drainage. No packing warranted. Do not feel antibiotics are indicated given lack of risk  factors. Follow-up with PCP. EKG was reviewed by Dr. Thomasene Lot, the EKG is being scanned into medical records as it was done by portal EKG machine and has not linked to [facility]. See her note for EKG interpretation. Return precautions on a patient discharge instructions. Patient is hemodynamically stable and ready for discharge.  Case discussed with Dr. Thomasene Lot who agrees with treatment plan.    [place], PA-C 05/14/15 [place], MD 05/15/15 [place], PA-C 06/02/15 [address] [place], PA-C 06/02/15 [place], MD 06/02/15 2316

## 2015-05-13 NOTE — ED Notes (Signed)
Pt stable, ambulatory, states understanding of discharge instructions 

## 2015-06-05 ENCOUNTER — Emergency Department (HOSPITAL_COMMUNITY)
Admission: EM | Admit: 2015-06-05 | Discharge: 2015-06-06 | Disposition: A | Discharge status: Home / Self Care | Payer: Non-veteran care | Attending: Emergency Medicine | Admitting: Emergency Medicine

## 2015-06-05 ENCOUNTER — Encounter (HOSPITAL_COMMUNITY): Payer: Self-pay | Admitting: *Deleted

## 2015-06-05 DIAGNOSIS — N939 Abnormal uterine and vaginal bleeding, unspecified: Secondary | ICD-10-CM | POA: Diagnosis present

## 2015-06-05 DIAGNOSIS — Z86018 Personal history of other benign neoplasm: Secondary | ICD-10-CM | POA: Diagnosis not present

## 2015-06-05 DIAGNOSIS — Z7982 Long term (current) use of aspirin: Secondary | ICD-10-CM | POA: Diagnosis not present

## 2015-06-05 DIAGNOSIS — Z8719 Personal history of other diseases of the digestive system: Secondary | ICD-10-CM | POA: Diagnosis not present

## 2015-06-05 DIAGNOSIS — D649 Anemia, unspecified: Secondary | ICD-10-CM | POA: Diagnosis not present

## 2015-06-05 DIAGNOSIS — D259 Leiomyoma of uterus, unspecified: Secondary | ICD-10-CM | POA: Diagnosis not present

## 2015-06-05 DIAGNOSIS — Z8659 Personal history of other mental and behavioral disorders: Secondary | ICD-10-CM | POA: Diagnosis not present

## 2015-06-05 DIAGNOSIS — Z79818 Long term (current) use of other agents affecting estrogen receptors and estrogen levels: Secondary | ICD-10-CM | POA: Diagnosis not present

## 2015-06-05 DIAGNOSIS — Z79899 Other long term (current) drug therapy: Secondary | ICD-10-CM | POA: Insufficient documentation

## 2015-06-05 DIAGNOSIS — Z3202 Encounter for pregnancy test, result negative: Secondary | ICD-10-CM | POA: Diagnosis not present

## 2015-06-05 DIAGNOSIS — E669 Obesity, unspecified: Secondary | ICD-10-CM | POA: Insufficient documentation

## 2015-06-05 DIAGNOSIS — N921 Excessive and frequent menstruation with irregular cycle: Secondary | ICD-10-CM

## 2015-06-05 DIAGNOSIS — R011 Cardiac murmur, unspecified: Secondary | ICD-10-CM | POA: Diagnosis not present

## 2015-06-05 LAB — CBC
HEMATOCRIT: 30 % — AB (ref 36.0–46.0)
Hemoglobin: 9.1 g/dL — ABNORMAL LOW (ref 12.0–15.0)
MCH: 26.6 pg (ref 26.0–34.0)
MCHC: 30.3 g/dL (ref 30.0–36.0)
MCV: 87.7 fL (ref 78.0–100.0)
Platelets: 332 10*3/uL (ref 150–400)
RBC: 3.42 MIL/uL — ABNORMAL LOW (ref 3.87–5.11)
RDW: 16.2 % — AB (ref 11.5–15.5)
WBC: 8.9 10*3/uL (ref 4.0–10.5)

## 2015-06-05 LAB — TYPE AND SCREEN
ABO/RH(D): A POS
Antibody Screen: NEGATIVE

## 2015-06-05 LAB — BASIC METABOLIC PANEL
ANION GAP: 12 (ref 5–15)
BUN: 9 mg/dL (ref 6–20)
CALCIUM: 11 mg/dL — AB (ref 8.9–10.3)
CO2: 19 mmol/L — AB (ref 22–32)
CREATININE: 1.16 mg/dL — AB (ref 0.44–1.00)
Chloride: 110 mmol/L (ref 101–111)
GFR calc Af Amer: >60 mL/min (ref >60)
GFR calc non Af Amer: 54 mL/min — ABNORMAL LOW (ref >60)
GLUCOSE: 111 mg/dL — AB (ref 65–99)
Potassium: 3.8 mmol/L (ref 3.5–5.1)
Sodium: 141 mmol/L (ref 135–145)

## 2015-06-05 LAB — I-STAT BETA HCG BLOOD, ED (MC, WL, AP ONLY): I-stat hCG, quantitative: <5 m[IU]/mL (ref <5)

## 2015-06-05 MED ORDER — ACETAMINOPHEN 500 MG PO TABS
1000.0000 mg | ORAL_TABLET | Freq: Once | ORAL | Status: AC
  Administered 2015-06-05: 1000 mg via ORAL
  Filled 2015-06-05: qty 2

## 2015-06-05 MED ORDER — SODIUM CHLORIDE 0.9 % IV BOLUS (SEPSIS)
1000.0000 mL | Freq: Once | INTRAVENOUS | Status: AC
  Administered 2015-06-05: 1000 mL via INTRAVENOUS

## 2015-06-05 MED ORDER — HYDROCODONE-ACETAMINOPHEN 5-325 MG PO TABS: ORAL_TABLET | ORAL | Status: DC

## 2015-06-05 NOTE — ED Provider Notes (Signed)
CSN: [id number]     Arrival date & time 06/05/15  1742 History   First MD Initiated Contact with Patient 06/05/15 2133     Chief Complaint  Patient presents with  . Vaginal Bleeding     (Consider location/radiation/quality/duration/timing/severity/associated sxs/prior Treatment) HPI   Blood pressure 116/81, pulse 100, temperature 99 F (37.2 C), temperature source Oral, resp. rate 18, height 5\' 4"  (1.626 m), weight 103.42 kg, last menstrual period 05/07/2015, SpO2 99 %.  Theresa Peterson is a 50 y.o. female past medical history significant for uterine fibroids, anemia requiring transfusions secondary to heavy menses, scheduled for hysterectomy on March 7 which she canceled because it was in [place] which is not practical for her. She's been taking Megace intermittently over the last 2 weeks, been having intermittent bleeding with blood clots, she is taking Megace about 10 out of the last 14 days, sometimes the full 40 mg dose, occasionally half the dose, states that it's been intermittently helpful to her. She reports severe lower abdominal pain, worse on the left right onset this afternoon rated at 7 out of 10, mildly relieved with ibuprofen and acetaminophen. She has reported endometrial biopsy on [place], states it was normal.     Past Medical History  Diagnosis Date  . Anemia   . Anxiety   . GERD (gastroesophageal reflux disease)   . Obesity   . Hemorrhoids   . Anemia   . Fibroids   . Heart murmur    History reviewed. No pertinent past surgical history. Family History  Problem Relation Age of Onset  . Colon cancer Maternal Uncle   . Breast cancer Maternal Aunt   . Heart disease Maternal Aunt   . Pancreatic cancer Maternal Grandmother   . Cancer Maternal Grandmother     pancreas  . Diabetes Maternal Uncle   . Hypertension Mother   . Renal Disease Mother    Social History  Substance Use Topics  . Smoking status: Never Smoker   . Smokeless tobacco: Never Used  .  Alcohol Use: Yes     Comment: occasionally   OB History    Gravida Para Term Preterm AB TAB SAB Ectopic Multiple Living   1 1 1       1      Review of Systems  10 systems reviewed and found to be negative, except as noted in the HPI.   Allergies  Food; Shellfish allergy; and Flagyl  Home Medications   Prior to Admission medications   Medication Sig Start Date End Date Taking? Authorizing Provider  acetaminophen (TYLENOL) 500 MG tablet Take 500-1,000 mg by mouth every 6 (six) hours as needed for mild pain.   Yes Historical Provider, MD  aspirin 325 MG tablet Take 650 mg by mouth daily as needed for mild pain or headache.    Yes Historical Provider, MD  bismuth subsalicylate (PEPTO BISMOL) 262 MG/15ML suspension Take 30 mLs by mouth every 6 (six) hours as needed for indigestion.   Yes Historical Provider, MD  Cholecalciferol (VITAMIN D) 2000 UNITS CAPS Take 2,000 Units by mouth daily as needed (takes occassionally).    Yes Historical Provider, MD  ferrous sulfate 325 (65 FE) MG tablet Take 325 mg by mouth 2 (two) times daily with a meal.    Yes Historical Provider, MD  ibuprofen (ADVIL,MOTRIN) 800 MG tablet Take 800 mg by mouth every 8 (eight) hours as needed for moderate pain.   Yes Historical Provider, MD  megestrol (MEGACE) 40 MG tablet  Take 2 tablets (80 mg total) by mouth 2 (two) times daily. Patient taking differently: Take 40 mg by mouth 2 (two) times daily.  02/27/15  Yes Tanna Savoy Stinson, DO  HYDROcodone-acetaminophen (NORCO/VICODIN) 5-325 MG tablet Take 1-2 tablets by mouth every 6 hours as needed for pain and/or cough. 06/05/15   Suad Autrey, PA-C   BP 110/78 mmHg  Pulse 78  Temp(Src) 99 F (37.2 C) (Oral)  Resp 18  Ht 5\' 4"  (1.626 m)  Wt 103.42 kg  BMI 39.12 kg/m2  SpO2 100%  LMP 05/07/2015 Physical Exam  Constitutional: She is oriented to person, place, and time. She appears well-developed and well-nourished. No distress.  HENT:  Head: Normocephalic.   Mouth/Throat: Oropharynx is clear and moist.  No significant conjunctival pallor  Eyes: Conjunctivae are normal.  Neck: Normal range of motion. No JVD present. No tracheal deviation present.  Cardiovascular: Normal rate, regular rhythm and intact distal pulses.   Radial pulse equal bilaterally  Pulmonary/Chest: Effort normal and breath sounds normal. No stridor. No respiratory distress. She has no wheezes. She has no rales. She exhibits no tenderness.  Abdominal: Soft. She exhibits mass. She exhibits no distension. There is no tenderness. There is no rebound and no guarding.  Uterus firm and palpable mildly tender to palpation  Genitourinary:  Exam a chaperoned by technician: Dark blood in the posterior fornix, no cervical or adnexal tenderness  Musculoskeletal: Normal range of motion. She exhibits no edema or tenderness.  No calf asymmetry, superficial collaterals, palpable cords, edema, Homans sign negative bilaterally.    Neurological: She is alert and oriented to person, place, and time.  Skin: Skin is warm. She is not diaphoretic.  Psychiatric: She has a normal mood and affect.  Nursing note and vitals reviewed.   ED Course  Procedures (including critical care time) Labs Review Labs Reviewed  CBC - Abnormal; Notable for the following:    RBC 3.42 (*)    Hemoglobin 9.1 (*)    HCT 30.0 (*)    RDW 16.2 (*)    All other components within normal limits  BASIC METABOLIC PANEL - Abnormal; Notable for the following:    CO2 19 (*)    Glucose, Bld 111 (*)    Creatinine, Ser 1.16 (*)    Calcium 11.0 (*)    GFR calc non Af Amer 54 (*)    All other components within normal limits  I-STAT BETA HCG BLOOD, ED ([facility], [facility], [facility] ONLY)  TYPE AND SCREEN    Imaging Review No results found. I have personally reviewed and evaluated these images and lab results as part of my medical decision-making.   EKG Interpretation None      MDM   Final diagnoses:  Uterine leiomyoma, unspecified  location  Menorrhagia with irregular cycle    Filed Vitals:   06/05/15 2215 06/05/15 2245 06/05/15 2300 06/05/15 2315  BP: 110/70 124/69 105/69 110/78  Pulse: 84 80 91 78  Temp:      TempSrc:      Resp:   18   Height:      Weight:      SpO2: 100% 100% 100% 100%    Medications  sodium chloride 0.9 % bolus 1,000 mL (1,000 mLs Intravenous New Bag/Given 06/05/15 2239)  acetaminophen (TYLENOL) tablet 1,000 mg (1,000 mg Oral Given 06/05/15 2239)    Kirin Shyenne Kogel is 50 y.o. female presenting with lower pelvic pain, excessive and intermittent menstruation. Patient has been taking her Megace intermittently. Abdominal  exam is benign, no significant anemia on blood work, patient was scheduled for hysterectomy out of town, case discussed with OB/GYN Dr. Nehemiah Settle who states that [facility] would be happy to manage her care. I have encouraged her to take her Megace regularly. Patient will follow with [facility], we've had an extensive discussion of return precautions for symptomatic anemia and patient verbalizes her understanding. She is mild elevation in her creatinine at 1.16. Patient given fluid bolus will have recheck next week.  Evaluation does not show pathology that would require ongoing emergent intervention or inpatient treatment. Pt is hemodynamically stable and mentating appropriately. Discussed findings and plan with patient/guardian, who agrees with care plan. All questions answered. Return precautions discussed and outpatient follow up given.   New Prescriptions   HYDROCODONE-ACETAMINOPHEN (NORCO/VICODIN) 5-325 MG TABLET    Take 1-2 tablets by mouth every 6 hours as needed for pain and/or cough.         Monico Blitz, PA-C 06/05/15 [place], MD 06/08/15 [phone]

## 2015-06-05 NOTE — Discharge Instructions (Signed)
Restart taking her Megace 40 mg daily as directed.  If your bleeding becomes greater than 1 pad per hour, you develop lightheadedness, passing out, shortness of breath please return immediately to the emergency room for recheck.  Your kidney tests today are not normal. You need to drink plenty of water and have this rechecked by your primary care doctor in the next week. Do not take any NSAIDs (motrin, ibuprofen, Advil, aleve , aspirin, naproxen etc.) because they can further hurt your kidneys.   Take vicodin for breakthrough pain, do not drink alcohol, drive, care for children or do other critical tasks while taking vicodin.   Abnormal Uterine Bleeding Abnormal uterine bleeding can affect women at various stages in life, including teenagers, women in their reproductive years, pregnant women, and women who have reached menopause. Several kinds of uterine bleeding are considered abnormal, including:  Bleeding or spotting between periods.   Bleeding after sexual intercourse.   Bleeding that is heavier or more than normal.   Periods that last longer than usual.  Bleeding after menopause.  Many cases of abnormal uterine bleeding are minor and simple to treat, while others are more serious. Any type of abnormal bleeding should be evaluated by your health care provider. Treatment will depend on the cause of the bleeding. HOME CARE INSTRUCTIONS Monitor your condition for any changes. The following actions may help to alleviate any discomfort you are experiencing:  Avoid the use of tampons and douches as directed by your health care provider.  Change your pads frequently. You should get regular pelvic exams and Pap tests. Keep all follow-up appointments for diagnostic tests as directed by your health care provider.  SEEK MEDICAL CARE IF:   Your bleeding lasts more than 1 week.   You feel dizzy at times.  SEEK IMMEDIATE MEDICAL CARE IF:   You pass out.   You are changing pads  every 15 to 30 minutes.   You have abdominal pain.  You have a fever.   You become sweaty or weak.   You are passing large blood clots from the vagina.   You start to feel nauseous and vomit. MAKE SURE YOU:   Understand these instructions.  Will watch your condition.  Will get help right away if you are not doing well or get worse.   This information is not intended to replace advice given to you by your health care provider. Make sure you discuss any questions you have with your health care provider.   Document Released: 02/18/2005 Document Revised: 02/23/2013 Document Reviewed: 09/17/2012 Elsevier Interactive Patient Education [employer].

## 2015-06-05 NOTE — ED Notes (Signed)
Pt reports having pelvic pain, irregular vaginal bleeding and passing large blood clots today. Hx of same that resulted in blood transfusion.

## 2015-06-10 ENCOUNTER — Encounter (HOSPITAL_COMMUNITY): Payer: Self-pay | Admitting: *Deleted

## 2015-06-10 ENCOUNTER — Inpatient Hospital Stay (HOSPITAL_COMMUNITY)
Admission: AD | Admit: 2015-06-10 | Discharge: 2015-06-10 | Disposition: A | Discharge status: Home / Self Care | Payer: Non-veteran care | Source: Ambulatory Visit | Attending: Obstetrics & Gynecology | Admitting: Obstetrics & Gynecology

## 2015-06-10 DIAGNOSIS — N920 Excessive and frequent menstruation with regular cycle: Secondary | ICD-10-CM | POA: Insufficient documentation

## 2015-06-10 DIAGNOSIS — F419 Anxiety disorder, unspecified: Secondary | ICD-10-CM | POA: Diagnosis not present

## 2015-06-10 DIAGNOSIS — Z79899 Other long term (current) drug therapy: Secondary | ICD-10-CM | POA: Diagnosis not present

## 2015-06-10 DIAGNOSIS — D500 Iron deficiency anemia secondary to blood loss (chronic): Secondary | ICD-10-CM

## 2015-06-10 DIAGNOSIS — N921 Excessive and frequent menstruation with irregular cycle: Secondary | ICD-10-CM

## 2015-06-10 DIAGNOSIS — N939 Abnormal uterine and vaginal bleeding, unspecified: Secondary | ICD-10-CM | POA: Diagnosis present

## 2015-06-10 DIAGNOSIS — D259 Leiomyoma of uterus, unspecified: Secondary | ICD-10-CM | POA: Diagnosis not present

## 2015-06-10 DIAGNOSIS — K219 Gastro-esophageal reflux disease without esophagitis: Secondary | ICD-10-CM | POA: Insufficient documentation

## 2015-06-10 LAB — CBC
HEMATOCRIT: 27.2 % — AB (ref 36.0–46.0)
Hemoglobin: 8.5 g/dL — ABNORMAL LOW (ref 12.0–15.0)
MCH: 28 pg (ref 26.0–34.0)
MCHC: 31.3 g/dL (ref 30.0–36.0)
MCV: 89.5 fL (ref 78.0–100.0)
PLATELETS: 341 10*3/uL (ref 150–400)
RBC: 3.04 MIL/uL — AB (ref 3.87–5.11)
RDW: 16.9 % — AB (ref 11.5–15.5)
WBC: 4.7 10*3/uL (ref 4.0–10.5)

## 2015-06-10 MED ORDER — MEGESTROL ACETATE 40 MG PO TABS: 40.0000 mg | ORAL_TABLET | Freq: Two times a day (BID) | ORAL | Status: DC

## 2015-06-10 NOTE — MAU Provider Note (Signed)
Chief Complaint:  Vaginal Bleeding and Abdominal Pain   First Provider Initiated Contact with Patient 06/10/15 Theresa Peterson is a 50 y.o. G1P1001 who presents to maternity admissions reporting heavy vaginal bleeding.  Was seen in ED 5 days ago and Hgb was 9.1.  Felt worse today with weakness and was worried about anemia.  . She reports vaginal bleeding, but no vaginal itching/burning, urinary symptoms, h/a, dizziness, n/v, or fever/chills.    Has a hysterectomy scheduled for May at the [facility].  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 Was told by ED doctor not to be on Megace that long.  Has not been taking it regularly.  Vaginal Bleeding The patient's primary symptoms include pelvic pain and vaginal bleeding. The patient's pertinent negatives include no genital itching, genital lesions, genital odor, missed menses or vaginal discharge. This is a recurrent problem. The current episode started 1 to 4 weeks ago. The problem occurs intermittently. The problem has been unchanged. The pain is moderate. The problem affects both sides. She is not pregnant. Associated symptoms include abdominal pain. Pertinent negatives include no back pain, chills, constipation, diarrhea, fever, flank pain, headaches, nausea or vomiting. The vaginal discharge was bloody. The vaginal bleeding is heavier than menses. She has been passing clots. She has not been passing tissue. Nothing aggravates the symptoms. She has tried nothing for the symptoms.    RN Note: about 1300 started having bad cramping, an hour later started having heavy bleeding. Been having irreg cycles since Oct. Dec started having heavy bleeding. Has been on Megace, not working as well now..feeling dizzy, got short of breath walking from the car          Past Medical History: Past Medical History  Diagnosis Date  . Anemia   . Anxiety   . GERD (gastroesophageal reflux disease)   . Obesity   . Hemorrhoids   . Anemia   . Fibroids   .  Heart murmur     Past obstetric history: OB History  Gravida Para Term Preterm AB SAB TAB Ectopic Multiple Living  1 1 1       1     # Outcome Date GA Lbr Len/2nd Weight Sex Delivery Anes PTL Lv  1 Term               Past Surgical History: History reviewed. No pertinent past surgical history.  Family History: Family History  Problem Relation Age of Onset  . Colon cancer Maternal Uncle   . Breast cancer Maternal Aunt   . Heart disease Maternal Aunt   . Pancreatic cancer Maternal Grandmother   . Cancer Maternal Grandmother     pancreas  . Diabetes Maternal Uncle   . Hypertension Mother   . Renal Disease Mother     Social History: Social History  Substance Use Topics  . Smoking status: Never Smoker   . Smokeless tobacco: Never Used  . Alcohol Use: Yes     Comment: occasionally    Allergies:  Allergies  Allergen Reactions  . Shellfish Allergy Anaphylaxis  . Flagyl [Metronidazole Hcl] Nausea And Vomiting    Meds:  Prescriptions prior to admission  Medication Sig Dispense Refill Last Dose  . acetaminophen (TYLENOL) 500 MG tablet Take 500 mg by mouth every 6 (six) hours as needed for mild pain or headache.    06/10/2015 at 1330  . calcium carbonate (TUMS - DOSED IN MG ELEMENTAL CALCIUM) 500 MG chewable tablet Chew 2 tablets  by mouth as needed for indigestion or heartburn.   Past Week at Unknown time  . cholecalciferol (VITAMIN D) 1000 units tablet Take 1,000 Units by mouth 3 (three) times a week.   Past Week at Unknown time  . ferrous sulfate 325 (65 FE) MG tablet Take 325 mg by mouth 3 (three) times daily with meals.    06/10/2015 at Unknown time  . HYDROcodone-acetaminophen (NORCO/VICODIN) 5-325 MG tablet Take 1-2 tablets by mouth every 6 (six) hours as needed for moderate pain (and/or cough).   Past Month at Unknown time  . megestrol (MEGACE) 40 MG tablet Take 40 mg by mouth every evening.   06/09/2015 at Unknown time    I have reviewed patient's Past Medical Hx,  Surgical Hx, Family Hx, Social Hx, medications and allergies.  ROS:  Review of Systems  Constitutional: Positive for fatigue. Negative for fever and chills.  Gastrointestinal: Positive for abdominal pain. Negative for nausea, vomiting, diarrhea and constipation.  Genitourinary: Positive for vaginal bleeding and pelvic pain. Negative for flank pain, vaginal discharge and missed menses.  Musculoskeletal: Negative for back pain.  Neurological: Positive for weakness. Negative for syncope and headaches.   Other systems negative     Physical Exam  Patient Vitals for the past 24 hrs:  BP Temp Temp src Pulse Resp SpO2 Weight  06/10/15 1700 - - - - - 100 % -  06/10/15 1659 141/84 mmHg 98.6 F (37 C) Oral 87 18 100 % 102.785 kg (226 lb 9.6 oz)   Constitutional: Well-developed, well-nourished female in no acute distress.  Cardiovascular: normal rate and rhythm, no ectopy audible, S1 & S2 heard, no murmur Respiratory: normal effort, no distress. Lungs CTAB with no wheezes or crackles GI: Abd soft, non-tender.  Nondistended.  No rebound, No guarding.  Bowel Sounds audible  MS: Extremities nontender, no edema, normal ROM Neurologic: Alert and oriented x 4.   Grossly nonfocal. GU: Neg CVAT. Skin:  Warm and Dry Psych:  Affect appropriate.  PELVIC EXAM: Cervix pink, visually closed, without lesion, Small blood in vault, vaginal walls and external genitalia normal Bimanual exam: Cervix firm, anterior, neg CMT, uterus nontender, enlarged, adnexa without tenderness, enlargement, or mass    Labs: Results for orders placed or performed during the hospital encounter of 06/10/15 (from the past 24 hour(s))  CBC     Status: Abnormal   Collection Time: 06/10/15  5:46 PM  Result Value Ref Range   WBC 4.7 4.0 - 10.5 K/uL   RBC 3.04 (L) 3.87 - 5.11 MIL/uL   Hemoglobin 8.5 (L) 12.0 - 15.0 g/dL   HCT 27.2 (L) 36.0 - 46.0 %   MCV 89.5 78.0 - 100.0 fL   MCH 28.0 26.0 - 34.0 pg   MCHC 31.3 30.0 - 36.0  g/dL   RDW 16.9 (H) 11.5 - 15.5 %   Platelets 341 150 - 400 K/uL    Ref. Range 06/05/2015 18:08  Hemoglobin Latest Ref Range: 12.0-15.0 g/dL 9.1 (L)    Imaging:  Dg Chest 2 View  05/13/2015  CLINICAL DATA:  Productive cough x2 weeks EXAM: CHEST  2 VIEW COMPARISON:  12/15/2014 FINDINGS: Mild lingular scarring. No focal consolidation. No pleural effusion or pneumothorax. The heart is normal in size. Visualized osseous structures are within normal limits. IMPRESSION: No evidence of acute cardiopulmonary disease. Electronically Signed   By: Julian Hy M.D.   On: 05/13/2015 14:31    MAU Course/MDM: I have ordered labs as follows: CBC Imaging ordered: none Results  reviewed.   Consult Dr Roselie Awkward who recommends increasing Megace to bid.   Treatments in MAU included none.   Pt stable at time of discharge.  Assessment: Menorrhagia Fibroid uterus  Plan: Discharge home Recommend [facility] Has Megace at home, increase to 40mg  bid    Medication List    ASK your doctor about these medications        acetaminophen 500 MG tablet  Commonly known as:  TYLENOL  Take 500 mg by mouth every 6 (six) hours as needed for mild pain or headache.     calcium carbonate 500 MG chewable tablet  Commonly known as:  TUMS - dosed in mg elemental calcium  Chew 2 tablets by mouth as needed for indigestion or heartburn.     cholecalciferol 1000 units tablet  Commonly known as:  VITAMIN D  Take 1,000 Units by mouth 3 (three) times a week.     ferrous sulfate 325 (65 FE) MG tablet  Take 325 mg by mouth 3 (three) times daily with meals.     HYDROcodone-acetaminophen 5-325 MG tablet  Commonly known as:  NORCO/VICODIN  Take 1-2 tablets by mouth every 6 (six) hours as needed for moderate pain (and/or cough).     megestrol 40 MG tablet  Commonly known as:  MEGACE  Take 40 mg by mouth every evening.       Encouraged to return here or to other Urgent Care/ED if she develops  worsening of symptoms, increase in pain, fever, or other concerning symptoms.   Hansel Feinstein CNM, MSN Certified Nurse-Midwife 06/10/2015 5:50 PM

## 2015-06-10 NOTE — MAU Note (Addendum)
about 1300 started having bad cramping, an hour later started having heavy bleeding.  Been having irreg cycles since Oct.  Dec started having heavy bleeding.  Has been on Megace, not working as well now..feeling dizzy, got short of breath walking from the car

## 2015-06-10 NOTE — Discharge Instructions (Signed)
Abnormal Uterine Bleeding Abnormal uterine bleeding can affect women at various stages in life, including teenagers, women in their reproductive years, pregnant women, and women who have reached menopause. Several kinds of uterine bleeding are considered abnormal, including:  Bleeding or spotting between periods.   Bleeding after sexual intercourse.   Bleeding that is heavier or more than normal.   Periods that last longer than usual.  Bleeding after menopause.  Many cases of abnormal uterine bleeding are minor and simple to treat, while others are more serious. Any type of abnormal bleeding should be evaluated by your health care provider. Treatment will depend on the cause of the bleeding. HOME CARE INSTRUCTIONS Monitor your condition for any changes. The following actions may help to alleviate any discomfort you are experiencing:  Avoid the use of tampons and douches as directed by your health care provider.  Change your pads frequently. You should get regular pelvic exams and Pap tests. Keep all follow-up appointments for diagnostic tests as directed by your health care provider.  SEEK MEDICAL CARE IF:   Your bleeding lasts more than 1 week.   You feel dizzy at times.  SEEK IMMEDIATE MEDICAL CARE IF:   You pass out.   You are changing pads every 15 to 30 minutes.   You have abdominal pain.  You have a fever.   You become sweaty or weak.   You are passing large blood clots from the vagina.   You start to feel nauseous and vomit. MAKE SURE YOU:   Understand these instructions.  Will watch your condition.  Will get help right away if you are not doing well or get worse.   This information is not intended to replace advice given to you by your health care provider. Make sure you discuss any questions you have with your health care provider.   Document Released: 02/18/2005 Document Revised: 02/23/2013 Document Reviewed: 09/17/2012 Elsevier Interactive  Patient Education 2016 [employer].  Anemia, Nonspecific Anemia is a condition in which the concentration of red blood cells or hemoglobin in the blood is below normal. Hemoglobin is a substance in red blood cells that carries oxygen to the tissues of the body. Anemia results in not enough oxygen reaching these tissues.  CAUSES  Common causes of anemia include:   Excessive bleeding. Bleeding may be internal or external. This includes excessive bleeding from periods (in women) or from the intestine.   Poor nutrition.   Chronic kidney, thyroid, and liver disease.  Bone marrow disorders that decrease red blood cell production.  Cancer and treatments for cancer.  HIV, AIDS, and their treatments.  Spleen problems that increase red blood cell destruction.  Blood disorders.  Excess destruction of red blood cells due to infection, medicines, and autoimmune disorders. SIGNS AND SYMPTOMS   Minor weakness.   Dizziness.   Headache.  Palpitations.   Shortness of breath, especially with exercise.   Paleness.  Cold sensitivity.  Indigestion.  Nausea.  Difficulty sleeping.  Difficulty concentrating. Symptoms may occur suddenly or they may develop slowly.  DIAGNOSIS  Additional blood tests are often needed. These help your health care provider determine the best treatment. Your health care provider will check your stool for blood and look for other causes of blood loss.  TREATMENT  Treatment varies depending on the cause of the anemia. Treatment can include:   Supplements of iron, vitamin U23, or folic acid.   Hormone medicines.   A blood transfusion. This may be needed if blood loss  is severe.   Hospitalization. This may be needed if there is significant continual blood loss.   Dietary changes.  Spleen removal. HOME CARE INSTRUCTIONS Keep all follow-up appointments. It often takes many weeks to correct anemia, and having your health care provider check on  your condition and your response to treatment is very important. SEEK IMMEDIATE MEDICAL CARE IF:   You develop extreme weakness, shortness of breath, or chest pain.   You become dizzy or have trouble concentrating.  You develop heavy vaginal bleeding.   You develop a rash.   You have bloody or black, tarry stools.   You faint.   You vomit up blood.   You vomit repeatedly.   You have abdominal pain.  You have a fever or persistent symptoms for more than 2-3 days.   You have a fever and your symptoms suddenly get worse.   You are dehydrated.  MAKE SURE YOU:  Understand these instructions.  Will watch your condition.  Will get help right away if you are not doing well or get worse.   This information is not intended to replace advice given to you by your health care provider. Make sure you discuss any questions you have with your health care provider.   Document Released: 03/28/2004 Document Revised: 10/21/2012 Document Reviewed: 08/14/2012 Elsevier Interactive Patient Education [employer].

## 2015-07-23 ENCOUNTER — Encounter (HOSPITAL_COMMUNITY): Payer: Self-pay

## 2015-07-23 ENCOUNTER — Emergency Department (HOSPITAL_COMMUNITY)
Admission: EM | Admit: 2015-07-23 | Discharge: 2015-07-23 | Disposition: A | Discharge status: Home / Self Care | Payer: Non-veteran care | Attending: Emergency Medicine | Admitting: Emergency Medicine

## 2015-07-23 DIAGNOSIS — Z4801 Encounter for change or removal of surgical wound dressing: Secondary | ICD-10-CM | POA: Diagnosis present

## 2015-07-23 DIAGNOSIS — Z90710 Acquired absence of both cervix and uterus: Secondary | ICD-10-CM | POA: Insufficient documentation

## 2015-07-23 DIAGNOSIS — Z5189 Encounter for other specified aftercare: Secondary | ICD-10-CM

## 2015-07-23 DIAGNOSIS — Z6837 Body mass index (BMI) 37.0-37.9, adult: Secondary | ICD-10-CM | POA: Diagnosis not present

## 2015-07-23 DIAGNOSIS — E669 Obesity, unspecified: Secondary | ICD-10-CM | POA: Diagnosis not present

## 2015-07-23 DIAGNOSIS — Z79899 Other long term (current) drug therapy: Secondary | ICD-10-CM | POA: Diagnosis not present

## 2015-07-23 NOTE — ED Notes (Signed)
Patient here to have hysterectomy incision checked post staple removal. Thinks it appears red and open. Wound looks good, no drainage, no dehiscence noted. Staples removed 5/12

## 2015-07-23 NOTE — ED Provider Notes (Signed)
CSN: [id number]     Arrival date & time 07/23/15  [id number] History  By signing my name below, I, Theresa Peterson, attest that this documentation has been prepared under the direction and in the presence of Theresa Pickett, PA-C. Electronically Signed: Randa Peterson, ED Scribe. 07/23/2015. 9:47 AM.     Chief Complaint  Patient presents with  . Wound Check   The history is provided by the patient. No language interpreter was used.   HPI Comments: Theresa Peterson is a 50 y.o. female who presents to the Emergency Department complaining of wound check to her lower abdomen. Pt states she has noticed some slight drainage coming from the incision a few days ago, called her doctor and was started on bactrim which made her sick so her doctor told her to stop them and cleanse wound with hydrogen peroxide and use neosporin which she has been doing. Pt reports hysterectomy on 07/04/2015. Pt states that she had the staples removed on 07/14/2015. States wound overall has been healing well.  States she noticed a "hole" to the left side of her abdomen this morning.  Pt doesn't report fever, chills, nausea or vomiting.   Past Medical History  Diagnosis Date  . Anemia   . Anxiety   . GERD (gastroesophageal reflux disease)   . Obesity   . Hemorrhoids   . Anemia   . Fibroids   . Heart murmur    History reviewed. No pertinent past surgical history. Family History  Problem Relation Age of Onset  . Colon cancer Maternal Uncle   . Breast cancer Maternal Aunt   . Heart disease Maternal Aunt   . Pancreatic cancer Maternal Grandmother   . Cancer Maternal Grandmother     pancreas  . Diabetes Maternal Uncle   . Hypertension Mother   . Renal Disease Mother    Social History  Substance Use Topics  . Smoking status: Never Smoker   . Smokeless tobacco: Never Used  . Alcohol Use: Yes     Comment: occasionally   OB History    Gravida Para Term Preterm AB TAB SAB Ectopic Multiple Living   1 1 1       1       Review of Systems  Constitutional: Negative for fever and chills.  Gastrointestinal: Negative for nausea and vomiting.  Skin: Positive for wound.  All other systems reviewed and are negative.     Allergies  Shellfish allergy and Flagyl  Home Medications   Prior to Admission medications   Medication Sig Start Date End Date Taking? Authorizing Provider  acetaminophen (TYLENOL) 500 MG tablet Take 500 mg by mouth every 6 (six) hours as needed for mild pain or headache.     Historical Provider, MD  calcium carbonate (TUMS - DOSED IN MG ELEMENTAL CALCIUM) 500 MG chewable tablet Chew 2 tablets by mouth as needed for indigestion or heartburn.    Historical Provider, MD  cholecalciferol (VITAMIN D) 1000 units tablet Take 1,000 Units by mouth 3 (three) times a week.    Historical Provider, MD  ferrous sulfate 325 (65 FE) MG tablet Take 325 mg by mouth 3 (three) times daily with meals.     Historical Provider, MD  HYDROcodone-acetaminophen (NORCO/VICODIN) 5-325 MG tablet Take 1-2 tablets by mouth every 6 (six) hours as needed for moderate pain (and/or cough).    Historical Provider, MD  megestrol (MEGACE) 40 MG tablet Take 1 tablet (40 mg total) by mouth 2 (two) times daily. 06/10/15  Seabron Spates, CNM   BP 119/81 mmHg  Pulse 81  Temp(Src) 98.7 F (37.1 C) (Oral)  Resp 16  Ht 5\' 4"  (1.626 m)  Wt 221 lb (100.245 kg)  BMI 37.92 kg/m2  SpO2 100%   Physical Exam  Constitutional: She is oriented to person, place, and time. She appears well-developed and well-nourished.  HENT:  Head: Normocephalic and atraumatic.  Mouth/Throat: Oropharynx is clear and moist.  Eyes: Conjunctivae and EOM are normal. Pupils are equal, round, and reactive to light.  Neck: Normal range of motion.  Cardiovascular: Normal rate, regular rhythm and normal heart sounds.   Pulmonary/Chest: Effort normal and breath sounds normal. No respiratory distress. She has no wheezes.  Abdominal: Soft. Bowel sounds are  normal.  Incision appears clean, no surrounding erythema or induration, no drainage, there are small puncture holes noted from where staples were present along the incision which are superficial and do not open into the abdominal cavity  Musculoskeletal: Normal range of motion.  Neurological: She is alert and oriented to person, place, and time.  Skin: Skin is warm and dry.  Psychiatric: She has a normal mood and affect.  Nursing note and vitals reviewed.   ED Course  Procedures (including critical care time) DIAGNOSTIC STUDIES: Oxygen Saturation is 100% on RA, normal by my interpretation.    COORDINATION OF CARE: 10:21 AM-Discussed treatment plan with pt at bedside and pt agreed to plan.     Labs Review Labs Reviewed - No data to display  Imaging Review No results found.    EKG Interpretation None      MDM   Final diagnoses:  Visit for wound check   50 year old female here for wound check. Had hysterectomy on 07/04/2015 at [facility] in [place].  States this morning she noticed a "hole" on the left side of her incision and would like it checked. On exam incision is well-healed. There are no signs of superimposed infection. She does have small puncture holes noted along the incision which appeared to be from her staples. These are superficial and do not open into the abdominal cavity. She has no drainage or other concerning signs of infection today. Will discharge home with continued wound care. Do not feel she needs antibiotics at this time. She has scheduled follow-up with her surgeon in a few weeks for her 6 week postop check.  Discussed plan with patient, he/she acknowledged understanding and agreed with plan of care.  Return precautions given for new or worsening symptoms.  I personally performed the services described in this documentation, which was scribed in my presence. The recorded information has been reviewed and is accurate.  Theresa Pickett, PA-C 07/23/15  1033  Carmin Muskrat, MD 07/23/15 1052

## 2015-07-23 NOTE — Discharge Instructions (Signed)
Continue home wound care with peroxide and neosporin at home.  Follow-up with your surgeon for any other complications.

## 2015-07-23 NOTE — ED Notes (Signed)
Had hysterectomy 5/02. Had staples removed 5/12. Noticed pinhole with drainage on left side of incision. Called MD and was given Bactrim. Pt could not tolerate the medicine so called MD back. Was told to clean with H202 and apply Bacitracin. Pt here today to have incisional wound checked.

## 2015-10-31 ENCOUNTER — Emergency Department (HOSPITAL_COMMUNITY)
Admission: EM | Admit: 2015-10-31 | Discharge: 2015-10-31 | Disposition: A | Discharge status: Home / Self Care | Payer: Non-veteran care | Attending: Emergency Medicine | Admitting: Emergency Medicine

## 2015-10-31 ENCOUNTER — Emergency Department (HOSPITAL_COMMUNITY): Payer: Non-veteran care

## 2015-10-31 ENCOUNTER — Encounter (HOSPITAL_COMMUNITY): Payer: Self-pay | Admitting: *Deleted

## 2015-10-31 DIAGNOSIS — M542 Cervicalgia: Secondary | ICD-10-CM

## 2015-10-31 DIAGNOSIS — R0789 Other chest pain: Secondary | ICD-10-CM | POA: Diagnosis not present

## 2015-10-31 DIAGNOSIS — Z7982 Long term (current) use of aspirin: Secondary | ICD-10-CM | POA: Diagnosis not present

## 2015-10-31 DIAGNOSIS — Z79899 Other long term (current) drug therapy: Secondary | ICD-10-CM | POA: Diagnosis not present

## 2015-10-31 DIAGNOSIS — R079 Chest pain, unspecified: Secondary | ICD-10-CM | POA: Diagnosis present

## 2015-10-31 LAB — BASIC METABOLIC PANEL
Anion gap: 6 (ref 5–15)
BUN: 9 mg/dL (ref 6–20)
CO2: 25 mmol/L (ref 22–32)
Calcium: 10.5 mg/dL — ABNORMAL HIGH (ref 8.9–10.3)
Chloride: 106 mmol/L (ref 101–111)
Creatinine, Ser: 0.79 mg/dL (ref 0.44–1.00)
GFR calc Af Amer: >60 mL/min (ref >60)
GFR calc non Af Amer: >60 mL/min (ref >60)
Glucose, Bld: 90 mg/dL (ref 65–99)
Potassium: 4.1 mmol/L (ref 3.5–5.1)
Sodium: 137 mmol/L (ref 135–145)

## 2015-10-31 LAB — I-STAT TROPONIN, ED: Troponin i, poc: 0 ng/mL (ref 0.00–0.08)

## 2015-10-31 LAB — CBC
HCT: 37.6 % (ref 36.0–46.0)
Hemoglobin: 11.8 g/dL — ABNORMAL LOW (ref 12.0–15.0)
MCH: 25.8 pg — ABNORMAL LOW (ref 26.0–34.0)
MCHC: 31.4 g/dL (ref 30.0–36.0)
MCV: 82.1 fL (ref 78.0–100.0)
Platelets: 296 10*3/uL (ref 150–400)
RBC: 4.58 MIL/uL (ref 3.87–5.11)
RDW: 17.5 % — ABNORMAL HIGH (ref 11.5–15.5)
WBC: 5.4 10*3/uL (ref 4.0–10.5)

## 2015-10-31 MED ORDER — CYCLOBENZAPRINE HCL 5 MG PO TABS: 10.0000 mg | ORAL_TABLET | Freq: Three times a day (TID) | ORAL | 0 refills | Status: DC | PRN

## 2015-10-31 NOTE — ED Provider Notes (Signed)
[facility] DEPT Provider Note   CSN: [id number] Arrival date & time: 10/31/15  1710  By signing my name below, I, Reola Mosher, attest that this documentation has been prepared under the direction and in the presence of Virgel Manifold, MD. Electronically Signed: Reola Mosher, ED Scribe. 10/31/15. 10:24 PM.  History   Chief Complaint Chief Complaint  Patient presents with  . Chest Pain  . Neck Pain   The history is provided by the patient. No language interpreter was used.   HPI Comments: Theresa Peterson is a 50 y.o. female with a PMHx of anemia, anxiety, GERD (on Zantac PRN), and obesity, who presents to the Emergency Department complaining of sudden onset, intermittent episodes of pinching left-sided chest pain x ~1 year, worsening ~2 weeks ago. She reports that her pain is also sometimes present in the left side of her neck. Pt notes that she has associated light-headedness and dizziness secondary to her episodes of pain. She states that each of her episodes of pain last for a couple of seconds. No exacerbating factors. Pt took an Asprin this AM with minimal relief of her pain. She has been seen by her PCP for this problem, with no specific workup or dx. Pt is a non-smoker. Denies fevers, chills, SOB, or any other associated symptoms.   Past Medical History:  Diagnosis Date  . Anemia   . Anemia   . Anxiety   . Fibroids   . GERD (gastroesophageal reflux disease)   . Heart murmur   . Hemorrhoids   . Obesity    Patient Active Problem List   Diagnosis Date Noted  . Symptomatic anemia 02/26/2015  . Vaginal bleeding 02/26/2015  . Atypical chest pain 02/08/2013  . Chest pain 12/10/2010  . W 12/10/2010  . Palpitations 12/10/2010  . GERD (gastroesophageal reflux disease) 10/18/2010   Past Surgical History:  Procedure Laterality Date  . ABDOMINAL HYSTERECTOMY     OB History    Gravida Para Term Preterm AB Living   1 1 1     1    SAB TAB Ectopic Multiple  Live Births                 Home Medications    Prior to Admission medications   Medication Sig Start Date End Date Taking? Authorizing Provider  acetaminophen (TYLENOL) 500 MG tablet Take 500 mg by mouth every 6 (six) hours as needed for mild pain or headache.    Yes Historical Provider, MD  aspirin 325 MG tablet Take 325 mg by mouth every 4 (four) hours as needed for mild pain.   Yes Historical Provider, MD  calcium carbonate (TUMS - DOSED IN MG ELEMENTAL CALCIUM) 500 MG chewable tablet Chew 2 tablets by mouth as needed for indigestion or heartburn.   Yes Historical Provider, MD  cholecalciferol (VITAMIN D) 1000 units tablet Take 1,000 Units by mouth 3 (three) times a week.   Yes Historical Provider, MD  ferrous sulfate 325 (65 FE) MG tablet Take 325 mg by mouth 3 (three) times daily with meals.    Yes Historical Provider, MD  ranitidine (ZANTAC) 150 MG tablet Take 150 mg by mouth daily as needed for heartburn.   Yes Historical Provider, MD  megestrol (MEGACE) 40 MG tablet Take 1 tablet (40 mg total) by mouth 2 (two) times daily. Patient not taking: Reported on 10/31/2015 06/10/15   Seabron Spates, CNM   Family History Family History  Problem Relation Age of Onset  .  Breast cancer Maternal Aunt   . Heart disease Maternal Aunt   . Hypertension Mother   . Renal Disease Mother   . Colon cancer Maternal Uncle   . Pancreatic cancer Maternal Grandmother   . Cancer Maternal Grandmother     pancreas  . Diabetes Maternal Uncle    Social History Social History  Substance Use Topics  . Smoking status: Never Smoker  . Smokeless tobacco: Never Used  . Alcohol use Yes     Comment: occasionally   Allergies   Shellfish allergy and Flagyl [metronidazole hcl]  Review of Systems Review of Systems  Constitutional: Negative for chills and fever.  Respiratory: Negative for shortness of breath.   Cardiovascular: Positive for chest pain.  Musculoskeletal: Positive for myalgias and neck pain.    Neurological: Positive for dizziness and light-headedness.  All other systems reviewed and are negative.  Physical Exam Updated Vital Signs BP 119/87   Pulse 67   Temp 97.9 F (36.6 C) (Oral)   Resp 14   LMP 06/10/2015 Comment: irreg bleeding, last was on 4/3  SpO2 100%   Physical Exam  Constitutional: She appears well-developed and well-nourished. No distress.  HENT:  Head: Normocephalic and atraumatic.  Mouth/Throat: Oropharynx is clear and moist. No oropharyngeal exudate.  Eyes: Conjunctivae are normal. Pupils are equal, round, and reactive to light. Right eye exhibits no discharge. Left eye exhibits no discharge. No scleral icterus.  Neck: Normal range of motion. Neck supple. No JVD present. No thyromegaly present.  Cardiovascular: Normal rate, regular rhythm, normal heart sounds and intact distal pulses.  Exam reveals no gallop and no friction rub.   No murmur heard. Pulmonary/Chest: Effort normal and breath sounds normal. No respiratory distress. She has no wheezes. She has no rales. She exhibits no tenderness.  Abdominal: Soft. Bowel sounds are normal. She exhibits no distension and no mass. There is no tenderness.  Musculoskeletal: Normal range of motion. She exhibits no edema or tenderness.  Lymphadenopathy:    She has no cervical adenopathy.  Neurological: She is alert. Coordination normal.  Skin: Skin is warm and dry. No rash noted. No erythema.  Psychiatric: She has a normal mood and affect. Her behavior is normal.  Nursing note and vitals reviewed.  ED Treatments / Results  DIAGNOSTIC STUDIES: Oxygen Saturation is 100% on RA, normal by my interpretation.   COORDINATION OF CARE: 10:23 PM-Discussed next steps with pt. Pt verbalized understanding and is agreeable with the plan.   Labs (all labs ordered are listed, but only abnormal results are displayed) Labs Reviewed  BASIC METABOLIC PANEL - Abnormal; Notable for the following:       Result Value   Calcium  10.5 (*)    All other components within normal limits  CBC - Abnormal; Notable for the following:    Hemoglobin 11.8 (*)    MCH 25.8 (*)    RDW 17.5 (*)    All other components within normal limits  I-STAT TROPOININ, ED   EKG  EKG Interpretation  Date/Time:  Tuesday October 31 2015 17:28:58 EDT Ventricular Rate:  77 PR Interval:  154 QRS Duration: 74 QT Interval:  396 QTC Calculation: 448 R Axis:   93 Text Interpretation:  Normal sinus rhythm Rightward axis Borderline ECG No significant change since last tracing Confirmed by Wilson Singer  MD, [place] ([id number]) on 10/31/2015 10:12:56 PM       Radiology Dg Chest 2 View  Result Date: 10/31/2015 CLINICAL DATA:  LEFT neck and chest pain, lightheaded  and dizziness for 2 weeks, worsening today. EXAM: CHEST  2 VIEW COMPARISON:  Chest radiograph May 13, 2015 FINDINGS: Cardiomediastinal silhouette is normal. No pleural effusions or focal consolidations. Faint LEFT midlung zone atelectasis or scarring. Trachea projects midline and there is no pneumothorax. Soft tissue planes and included osseous structures are non-suspicious. IMPRESSION: Similar LEFT midlung zone atelectasis versus scarring. Electronically Signed   By: Elon Alas M.D.   On: 10/31/2015 18:27   Procedures Procedures (including critical care time)  Medications Ordered in ED Medications - No data to display  Initial Impression / Assessment and Plan / ED Course  I have reviewed the triage vital signs and the nursing notes.  Pertinent labs & imaging results that were available during my care of the patient were reviewed by me and considered in my medical decision making (see chart for details).  Clinical Course   49yF with neck and chest pain consistent with MS etiology. Doubt ACS, PE, dissection or other emergent etiology.   Final Clinical Impressions(s) / ED Diagnoses   Final diagnoses:  Neck pain  Chest wall pain    New Prescriptions New Prescriptions   No  medications on file   I personally preformed the services scribed in my presence. The recorded information has been reviewed is accurate. Virgel Manifold, MD.     Virgel Manifold, MD 11/09/15 [phone]

## 2015-10-31 NOTE — ED Triage Notes (Signed)
PT is here with chest pain that started today and neck pain has been going on for 3 weeks.  Pt reports she has a lipoma under left breast

## 2016-02-04 ENCOUNTER — Encounter (HOSPITAL_COMMUNITY): Payer: Self-pay | Admitting: *Deleted

## 2016-02-04 ENCOUNTER — Emergency Department (HOSPITAL_COMMUNITY)
Admission: EM | Admit: 2016-02-04 | Discharge: 2016-02-04 | Disposition: A | Discharge status: Home / Self Care | Payer: Non-veteran care | Attending: Emergency Medicine | Admitting: Emergency Medicine

## 2016-02-04 DIAGNOSIS — R42 Dizziness and giddiness: Secondary | ICD-10-CM

## 2016-02-04 DIAGNOSIS — Z79899 Other long term (current) drug therapy: Secondary | ICD-10-CM | POA: Diagnosis not present

## 2016-02-04 DIAGNOSIS — Z7982 Long term (current) use of aspirin: Secondary | ICD-10-CM | POA: Diagnosis not present

## 2016-02-04 DIAGNOSIS — R1013 Epigastric pain: Secondary | ICD-10-CM

## 2016-02-04 LAB — COMPREHENSIVE METABOLIC PANEL
ALBUMIN: 4.3 g/dL (ref 3.5–5.0)
ALK PHOS: 80 U/L (ref 38–126)
ALT: 14 U/L (ref 14–54)
ANION GAP: 6 (ref 5–15)
AST: 18 U/L (ref 15–41)
BUN: 9 mg/dL (ref 6–20)
CALCIUM: 10.1 mg/dL (ref 8.9–10.3)
CO2: 27 mmol/L (ref 22–32)
Chloride: 105 mmol/L (ref 101–111)
Creatinine, Ser: 0.77 mg/dL (ref 0.44–1.00)
GFR calc Af Amer: >60 mL/min (ref >60)
GFR calc non Af Amer: >60 mL/min (ref >60)
GLUCOSE: 99 mg/dL (ref 65–99)
POTASSIUM: 4.2 mmol/L (ref 3.5–5.1)
SODIUM: 138 mmol/L (ref 135–145)
Total Bilirubin: 0.8 mg/dL (ref 0.3–1.2)
Total Protein: 8.1 g/dL (ref 6.5–8.1)

## 2016-02-04 LAB — CBC WITH DIFFERENTIAL/PLATELET
Basophils Absolute: 0 10*3/uL (ref 0.0–0.1)
Basophils Relative: 0 %
Eosinophils Absolute: 0.1 10*3/uL (ref 0.0–0.7)
Eosinophils Relative: 2 %
HEMATOCRIT: 39.1 % (ref 36.0–46.0)
Hemoglobin: 12.8 g/dL (ref 12.0–15.0)
LYMPHS PCT: 26 %
Lymphs Abs: 1.3 10*3/uL (ref 0.7–4.0)
MCH: 27.3 pg (ref 26.0–34.0)
MCHC: 32.7 g/dL (ref 30.0–36.0)
MCV: 83.4 fL (ref 78.0–100.0)
MONO ABS: 0.2 10*3/uL (ref 0.1–1.0)
MONOS PCT: 4 %
NEUTROS ABS: 3.6 10*3/uL (ref 1.7–7.7)
Neutrophils Relative %: 68 %
Platelets: 299 10*3/uL (ref 150–400)
RBC: 4.69 MIL/uL (ref 3.87–5.11)
RDW: 14.5 % (ref 11.5–15.5)
WBC: 5.2 10*3/uL (ref 4.0–10.5)

## 2016-02-04 LAB — URINALYSIS, ROUTINE W REFLEX MICROSCOPIC
BILIRUBIN URINE: NEGATIVE
GLUCOSE, UA: NEGATIVE mg/dL
HGB URINE DIPSTICK: NEGATIVE
KETONES UR: NEGATIVE mg/dL
Leukocytes, UA: NEGATIVE
Nitrite: NEGATIVE
PH: 7 (ref 5.0–8.0)
Protein, ur: NEGATIVE mg/dL
Specific Gravity, Urine: 1.011 (ref 1.005–1.030)

## 2016-02-04 MED ORDER — SODIUM CHLORIDE 0.9 % IV BOLUS (SEPSIS)
1000.0000 mL | Freq: Once | INTRAVENOUS | Status: AC
  Administered 2016-02-04: 1000 mL via INTRAVENOUS

## 2016-02-04 MED ORDER — GI COCKTAIL ~~LOC~~
30.0000 mL | Freq: Once | ORAL | Status: AC
  Administered 2016-02-04: 30 mL via ORAL
  Filled 2016-02-04: qty 30

## 2016-02-04 NOTE — ED Notes (Addendum)
Pt reports ever since hysterectomy she has been experiencing "mood swings and changes", however was told "it's from anesthesia" she was given for surgery.

## 2016-02-04 NOTE — ED Notes (Signed)
Bed: [id number] Expected date:  Expected time:  Means of arrival:  Comments: 50 yo SOB, Lung CA

## 2016-02-04 NOTE — ED Notes (Signed)
ED Provider at bedside. 

## 2016-02-04 NOTE — Discharge Instructions (Signed)
Your blood work and urine is completely normal, with no evidence of infection, anemia, hypoglycemia, or electrolyte abnormality. Your abdominal pain is likely related to your GERD. Please follow-up with your PCP and go to your scheduled upper endoscopy tomorrow.

## 2016-02-04 NOTE — ED Provider Notes (Signed)
[facility] DEPT Provider Note   CSN: [id number] Arrival date & time: 02/04/16  0759     History   Chief Complaint Chief Complaint  Patient presents with  . Dizziness  . Abdominal Pain  . Nausea    HPI Theresa Peterson is a 50 y.o. female.  Patient is a 50 year old female with past medical history of GERD, fibroids, and anemia secondary to menorrhagia, recent abdominal hysterectomy in May, presenting with chief complaint of lightheadedness and epigastric abdominal pain for about one week. She denies any headache, dizziness, numbness, weakness, vision change, spinning sensation, syncope, or difficulty ambulating. She also reports mild shortness of breath with exertion, but denies any chest pain or palpitations. She denies any swelling or pain in her legs. Epigastric abdominal pain has been chronic, and she takes omeprazole daily for relief of reflux. She has an upper endoscopy scheduled at the [facility] tomorrow. She denies any fever, chills, vomiting, change in bowel habits, melena, hematochezia, dysuria, or vaginal bleeding.      Past Medical History:  Diagnosis Date  . Anemia   . Anemia   . Anxiety   . Fibroids   . GERD (gastroesophageal reflux disease)   . Heart murmur   . Hemorrhoids   . Obesity     Patient Active Problem List   Diagnosis Date Noted  . Symptomatic anemia 02/26/2015  . Vaginal bleeding 02/26/2015  . Atypical chest pain 02/08/2013  . Chest pain 12/10/2010  . W 12/10/2010  . Palpitations 12/10/2010  . GERD (gastroesophageal reflux disease) 10/18/2010    Past Surgical History:  Procedure Laterality Date  . ABDOMINAL HYSTERECTOMY      OB History    Gravida Para Term Preterm AB Living   1 1 1     1    SAB TAB Ectopic Multiple Live Births                   Home Medications    Prior to Admission medications   Medication Sig Start Date End Date Taking? Authorizing Provider  acetaminophen (TYLENOL) 500 MG tablet Take 500 mg by mouth every 6  (six) hours as needed for mild pain or headache.     Historical Provider, MD  aspirin 325 MG tablet Take 325 mg by mouth every 4 (four) hours as needed for mild pain.    Historical Provider, MD  calcium carbonate (TUMS - DOSED IN MG ELEMENTAL CALCIUM) 500 MG chewable tablet Chew 2 tablets by mouth as needed for indigestion or heartburn.    Historical Provider, MD  cholecalciferol (VITAMIN D) 1000 units tablet Take 1,000 Units by mouth 3 (three) times a week.    Historical Provider, MD  cyclobenzaprine (FLEXERIL) 5 MG tablet Take 2 tablets (10 mg total) by mouth 3 (three) times daily as needed for muscle spasms. 10/31/15   Virgel Manifold, MD  ferrous sulfate 325 (65 FE) MG tablet Take 325 mg by mouth 3 (three) times daily with meals.     Historical Provider, MD  megestrol (MEGACE) 40 MG tablet Take 1 tablet (40 mg total) by mouth 2 (two) times daily. Patient not taking: Reported on 10/31/2015 06/10/15   Seabron Spates, CNM  ranitidine (ZANTAC) 150 MG tablet Take 150 mg by mouth daily as needed for heartburn.    Historical Provider, MD    Family History Family History  Problem Relation Age of Onset  . Breast cancer Maternal Aunt   . Heart disease Maternal Aunt   . Hypertension Mother   .  Renal Disease Mother   . Colon cancer Maternal Uncle   . Pancreatic cancer Maternal Grandmother   . Cancer Maternal Grandmother     pancreas  . Diabetes Maternal Uncle     Social History Social History  Substance Use Topics  . Smoking status: Never Smoker  . Smokeless tobacco: Never Used  . Alcohol use Yes     Comment: occasionally     Allergies   Shellfish allergy and Flagyl [metronidazole hcl]   Review of Systems Review of Systems  All other systems reviewed and are negative.    Physical Exam Updated Vital Signs BP 113/81   Pulse 60   Temp 98.1 F (36.7 C) (Oral)   Resp 14   Ht 5\' 4"  (1.626 m)   Wt 104.3 kg   LMP 06/10/2015 Comment: irreg bleeding, last was on 4/3  SpO2 100%   BMI  39.48 kg/m   Physical Exam  Constitutional: She is oriented to person, place, and time.  Obese female, in no acute distress.  HENT:  Head: Normocephalic and atraumatic.  Mouth/Throat: Oropharynx is clear and moist.  Eyes: Conjunctivae and EOM are normal. Pupils are equal, round, and reactive to light.  Neck: Normal range of motion. Neck supple.  Cardiovascular: Normal rate, regular rhythm and intact distal pulses.   Murmur heard. Pulmonary/Chest: Effort normal and breath sounds normal. No respiratory distress. She has no wheezes. She has no rales. She exhibits no tenderness.  Abdominal: Soft. Bowel sounds are normal. She exhibits no distension. There is tenderness. There is no guarding.  Mild epigastric abdominal TTP only.  Negative Murphy's, negative McBurney's, no CVA tenderness.  Musculoskeletal: Normal range of motion. She exhibits no edema or tenderness.  Lymphadenopathy:    She has no cervical adenopathy.  Neurological: She is alert and oriented to person, place, and time.  Speech is clear and goal oriented, follows commands. Cranial nerves III - XII without deficit, no facial droop. Normal strength in upper and lower extremities bilaterally, strong and equal grip strength. Sensation normal to light and sharp touch. Moves extremities without ataxia, coordination intact. Normal finger to nose and rapid alternating movements. Negative Romberg, no pronator drift.  Skin: Skin is warm and dry.  Psychiatric: She has a normal mood and affect.  Nursing note and vitals reviewed.    ED Treatments / Results  Labs (all labs ordered are listed, but only abnormal results are displayed) Labs Reviewed  COMPREHENSIVE METABOLIC PANEL  CBC WITH DIFFERENTIAL/PLATELET  URINALYSIS, ROUTINE W REFLEX MICROSCOPIC (NOT AT [facility])    EKG  EKG Interpretation None       Radiology No results found.  Procedures Procedures (including critical care time)  Medications Ordered in  ED Medications  sodium chloride 0.9 % bolus 1,000 mL (1,000 mLs Intravenous New Bag/Given 02/04/16 0935)  gi cocktail (Maalox,Lidocaine,Donnatal) (30 mLs Oral Given 02/04/16 0935)     Initial Impression / Assessment and Plan / ED Course  I have reviewed the triage vital signs and the nursing notes.  Pertinent labs & imaging results that were available during my care of the patient were reviewed by me and considered in my medical decision making (see chart for details).  Clinical Course    Patient is a 50 year old female presenting with chief complaint of lightheadedness and epigastric abdominal pain for about one week. Vital signs stable and patient is in no acute distress. Unremarkable abdominal exam with epigastric tenderness to palpation only, and completely normal neuro exam. Given IV fluids and GI  cocktail. CBC, CMP, and urinalysis are completely normal. No evidence of infection, anemia, hypoglycemia, or electrolyte abnormality. On reassessment, patient states she feels better. Stable for d/c home with instructions to continue taking omeprazole and to follow-up with PCP for reevaluation of symptoms. Also advised to go to scheduled upper endoscopy tomorrow. Stable for d/c home with return precautions for new or worsening symptoms.  Final Clinical Impressions(s) / ED Diagnoses   Final diagnoses:  Epigastric abdominal pain  Lightheadedness    New Prescriptions New Prescriptions   No medications on file     Rosilyn Mings II, [place] 02/04/16 1114    Charlesetta Shanks, MD 02/05/16 [phone]

## 2016-02-04 NOTE — ED Triage Notes (Signed)
Pt reports nausea, dizziness, LLQ abdominal pain, Shortness of breath, "queezy feeling in my mid chest"

## 2016-02-04 NOTE — ED Notes (Signed)
Pt sts feels weak, dizzy, nauseous, sts had hysterectomy back in May and now c/o left lower abdominal pain, also sts epigastric pain, and occasional shortness of breath

## 2016-02-29 ENCOUNTER — Emergency Department (HOSPITAL_COMMUNITY): Payer: Non-veteran care

## 2016-02-29 ENCOUNTER — Emergency Department (HOSPITAL_COMMUNITY)
Admission: EM | Admit: 2016-02-29 | Discharge: 2016-02-29 | Disposition: A | Discharge status: Home / Self Care | Payer: Non-veteran care | Attending: Emergency Medicine | Admitting: Emergency Medicine

## 2016-02-29 ENCOUNTER — Encounter (HOSPITAL_COMMUNITY): Payer: Self-pay | Admitting: Emergency Medicine

## 2016-02-29 DIAGNOSIS — Z7982 Long term (current) use of aspirin: Secondary | ICD-10-CM | POA: Diagnosis not present

## 2016-02-29 DIAGNOSIS — R102 Pelvic and perineal pain: Secondary | ICD-10-CM

## 2016-02-29 DIAGNOSIS — N83292 Other ovarian cyst, left side: Secondary | ICD-10-CM | POA: Insufficient documentation

## 2016-02-29 DIAGNOSIS — N83209 Unspecified ovarian cyst, unspecified side: Secondary | ICD-10-CM

## 2016-02-29 DIAGNOSIS — Z79899 Other long term (current) drug therapy: Secondary | ICD-10-CM | POA: Insufficient documentation

## 2016-02-29 DIAGNOSIS — R1032 Left lower quadrant pain: Secondary | ICD-10-CM | POA: Diagnosis present

## 2016-02-29 LAB — LIPASE, BLOOD: LIPASE: 35 U/L (ref 11–51)

## 2016-02-29 LAB — COMPREHENSIVE METABOLIC PANEL
ALBUMIN: 4.1 g/dL (ref 3.5–5.0)
ALK PHOS: 72 U/L (ref 38–126)
ALT: 20 U/L (ref 14–54)
ANION GAP: 7 (ref 5–15)
AST: 20 U/L (ref 15–41)
BUN: 15 mg/dL (ref 6–20)
CALCIUM: 9.7 mg/dL (ref 8.9–10.3)
CHLORIDE: 108 mmol/L (ref 101–111)
CO2: 26 mmol/L (ref 22–32)
Creatinine, Ser: 0.88 mg/dL (ref 0.44–1.00)
GFR calc Af Amer: >60 mL/min (ref >60)
GFR calc non Af Amer: >60 mL/min (ref >60)
GLUCOSE: 98 mg/dL (ref 65–99)
Potassium: 3.9 mmol/L (ref 3.5–5.1)
SODIUM: 141 mmol/L (ref 135–145)
Total Bilirubin: 0.6 mg/dL (ref 0.3–1.2)
Total Protein: 6.9 g/dL (ref 6.5–8.1)

## 2016-02-29 LAB — URINALYSIS, ROUTINE W REFLEX MICROSCOPIC
BILIRUBIN URINE: NEGATIVE
GLUCOSE, UA: NEGATIVE mg/dL
HGB URINE DIPSTICK: NEGATIVE
Ketones, ur: NEGATIVE mg/dL
Leukocytes, UA: NEGATIVE
Nitrite: NEGATIVE
PH: 6 (ref 5.0–8.0)
Protein, ur: NEGATIVE mg/dL
SPECIFIC GRAVITY, URINE: 1.006 (ref 1.005–1.030)

## 2016-02-29 LAB — CBC
HEMATOCRIT: 34.7 % — AB (ref 36.0–46.0)
HEMOGLOBIN: 11.3 g/dL — AB (ref 12.0–15.0)
MCH: 27.2 pg (ref 26.0–34.0)
MCHC: 32.6 g/dL (ref 30.0–36.0)
MCV: 83.4 fL (ref 78.0–100.0)
Platelets: 284 10*3/uL (ref 150–400)
RBC: 4.16 MIL/uL (ref 3.87–5.11)
RDW: 14.7 % (ref 11.5–15.5)
WBC: 6 10*3/uL (ref 4.0–10.5)

## 2016-02-29 MED ORDER — IOPAMIDOL (ISOVUE-300) INJECTION 61%
INTRAVENOUS | Status: AC
  Filled 2016-02-29: qty 100

## 2016-02-29 MED ORDER — IOPAMIDOL (ISOVUE-300) INJECTION 61%
100.0000 mL | Freq: Once | INTRAVENOUS | Status: AC | PRN
  Administered 2016-02-29: 100 mL via INTRAVENOUS

## 2016-02-29 MED ORDER — KETOROLAC TROMETHAMINE 15 MG/ML IJ SOLN
15.0000 mg | Freq: Once | INTRAMUSCULAR | Status: AC
  Administered 2016-02-29: 15 mg via INTRAVENOUS
  Filled 2016-02-29: qty 1

## 2016-02-29 NOTE — ED Provider Notes (Signed)
50 year old female signed out to me at shift change pending CT and urinalysis and disposition. Please see previous providers note for full H&P. Patient status post partial hysterectomy with left lower abdominal pain since hysterectomy, worsened over the last week. Patient denied any vaginal discharge or bleeding. She denied any infectious etiology. CT scan showed left adnexal cyst with differential including left ovarian cysts, cyst with adjacent postoperative fluid, or peritoneal inclusion cyst. Recommended ultrasound follow-up. Ultrasound showed findings consistent with recently ruptured cyst. Patient's pain at a level that does not require her to take pain medication at home for this. She has no signs of infectious etiology. She'll be referred to her OB/GYN for further evaluation. She is instructed to inform them of all relevant data from today's visit,she is instructed return immediately if she turns his any new or worsening signs or symptoms. Patient verbalized understanding and agreement to today's plan had no further questions or concerns at the time discharge.  Vitals:   02/29/16 1915 02/29/16 2042  BP: 126/83 143/88  Pulse: 60 60  Resp: 18   Temp: 98.2 F (36.8 C)       Okey Regal, PA-C 03/01/16 [id number]    Carmin Muskrat, MD 03/01/16 [phone]

## 2016-02-29 NOTE — Discharge Instructions (Signed)
Please read attached information. If you experience any new or worsening signs or symptoms please return to the emergency room for evaluation. Please follow-up with your primary care provider or specialist as discussed.  °

## 2016-02-29 NOTE — ED Notes (Signed)
Pt states the headache pain is located behind her left eye and is worse with leaning over. Pt reports that the abdominal pain is always on the left side close to surgical incision and describes it as sharp and burning.

## 2016-02-29 NOTE — ED Triage Notes (Signed)
Patient reports left lower abdominal pain, left flank pain, and headache x1 week. Patient has tried aspirin with some relief. Patient is conscious, alert, oriented, ambulatory. Patient has hysterectomy about 1 month ago.

## 2016-02-29 NOTE — ED Notes (Signed)
Patient transported to Ultrasound 

## 2016-02-29 NOTE — ED Provider Notes (Signed)
[facility] DEPT Provider Note   CSN: [id number] Arrival date & time: 02/29/16  1047     History   Chief Complaint Chief Complaint  Patient presents with  . Abdominal Pain  . Flank Pain  . Headache    HPI Theresa Peterson is a 50 y.o. female history of GERD, and hysterectomy about one month ago reports left lower abdominal pain and headache 1 week. Patient states that her left lower quadrant pain has been present since her hysterectomy and has been a dull pain since then however one week ago it began more intense pressure and slight sharp pains several times a day lasting several seconds. She states her pain is at 4/10 at this time. She also reports a left sided headache that also affected her behind her left eye. Patient denies eye pain. She states it may be sinus pressure. She denies any visual disturbances, changes in vision, photophobia, changes in gait, vomiting, nausea. Patient states that she has tried aspirin with some relief. Patient denies chest pain, shortness of breath, changes in bowel movements, dysuria, or any other urinary symptoms.  HPI  Past Medical History:  Diagnosis Date  . Anemia   . Anemia   . Anxiety   . Fibroids   . GERD (gastroesophageal reflux disease)   . Heart murmur   . Hemorrhoids   . Obesity     Patient Active Problem List   Diagnosis Date Noted  . Symptomatic anemia 02/26/2015  . Vaginal bleeding 02/26/2015  . Atypical chest pain 02/08/2013  . Chest pain 12/10/2010  . W 12/10/2010  . Palpitations 12/10/2010  . GERD (gastroesophageal reflux disease) 10/18/2010    Past Surgical History:  Procedure Laterality Date  . ABDOMINAL HYSTERECTOMY      OB History    Gravida Para Term Preterm AB Living   1 1 1     1    SAB TAB Ectopic Multiple Live Births                   Home Medications    Prior to Admission medications   Medication Sig Start Date End Date Taking? Authorizing Provider  aspirin 325 MG tablet Take 325 mg by  mouth every 4 (four) hours as needed for mild pain.   Yes Historical Provider, MD  calcium carbonate (TUMS - DOSED IN MG ELEMENTAL CALCIUM) 500 MG chewable tablet Chew 2 tablets by mouth as needed for indigestion or heartburn.   Yes Historical Provider, MD  ferrous sulfate 325 (65 FE) MG tablet Take 325 mg by mouth 3 (three) times daily with meals.    Yes Historical Provider, MD  omeprazole (PRILOSEC) 40 MG capsule Take 40 mg by mouth daily.   Yes Historical Provider, MD  cyclobenzaprine (FLEXERIL) 5 MG tablet Take 2 tablets (10 mg total) by mouth 3 (three) times daily as needed for muscle spasms. Patient not taking: Reported on 02/04/2016 10/31/15   Virgel Manifold, MD  megestrol (MEGACE) 40 MG tablet Take 1 tablet (40 mg total) by mouth 2 (two) times daily. Patient not taking: Reported on 02/04/2016 06/10/15   Seabron Spates, CNM    Family History Family History  Problem Relation Age of Onset  . Breast cancer Maternal Aunt   . Heart disease Maternal Aunt   . Hypertension Mother   . Renal Disease Mother   . Colon cancer Maternal Uncle   . Pancreatic cancer Maternal Grandmother   . Cancer Maternal Grandmother     pancreas  .  Diabetes Maternal Uncle     Social History Social History  Substance Use Topics  . Smoking status: Never Smoker  . Smokeless tobacco: Never Used  . Alcohol use Yes     Comment: occasionally     Allergies   Shellfish allergy and Flagyl [metronidazole hcl]   Review of Systems Review of Systems  Constitutional: Negative for chills and fever.  HENT: Negative for hearing loss and sore throat.   Eyes: Negative for photophobia, pain and visual disturbance.       Pain behind left eye. No pain or visual symptoms.   Respiratory: Negative for shortness of breath.   Cardiovascular: Negative for chest pain.  Gastrointestinal: Positive for abdominal pain. Negative for diarrhea, nausea and vomiting.  Genitourinary: Negative for difficulty urinating and dysuria.    Musculoskeletal: Negative for gait problem, neck pain and neck stiffness.  Skin: Positive for wound (Incision site of recent hysterectomy 1 month ago. ).  Neurological: Positive for headaches. Negative for dizziness, facial asymmetry, speech difficulty, light-headedness and numbness.     Physical Exam Updated Vital Signs BP 127/85   Pulse 68   Temp 98.7 F (37.1 C) (Oral)   Resp 18   Ht 5\' 4"  (1.626 m)   Wt 104.3 kg   LMP 06/10/2015 Comment: irreg bleeding, last was on 4/3  SpO2 100%   BMI 39.48 kg/m   Physical Exam  Constitutional: She is oriented to person, place, and time. She appears well-developed and well-nourished.  HENT:  Head: Normocephalic and atraumatic.  Nose: Nose normal.  Eyes: Conjunctivae and EOM are normal. Pupils are equal, round, and reactive to light.  Neck: Normal range of motion. Neck supple.  Cardiovascular: Normal rate and normal heart sounds.   Pulmonary/Chest: Effort normal and breath sounds normal. No respiratory distress. She exhibits no tenderness.  Abdominal: Soft. Bowel sounds are normal. She exhibits no distension, no pulsatile liver and no pulsatile midline mass. There is no hepatosplenomegaly. There is tenderness (Lower left quadrant). There is guarding (Slight). There is no rebound, no tenderness at McBurney's point and negative Murphy's sign.  Musculoskeletal: Normal range of motion.  Neurological: She is alert and oriented to person, place, and time.  Cranial Nerves:  III,IV, VI: ptosis not present, extra-ocular movements intact bilaterally, direct and consensual pupillary light reflexes intact bilaterally V: facial sensation, jaw opening, and bite strength equal bilaterally VII: eyebrow raise, eyelid close, smile, frown, pucker equal bilaterally VIII: hearing grossly normal bilaterally  IX,X: palate elevation and swallowing intact XI: bilateral shoulder shrug and lateral head rotation equal and strong XII: midline tongue  extension  Negative pronator sign, negative figure nose, negative heel-to-shin, negative RAMs.  Patient able to ambulate well without difficulty.  Skin: Skin is warm. Capillary refill takes less than 2 seconds.  Psychiatric: She has a normal mood and affect. Her behavior is normal.  Nursing note and vitals reviewed.    ED Treatments / Results  Labs (all labs ordered are listed, but only abnormal results are displayed) Labs Reviewed  CBC - Abnormal; Notable for the following:       Result Value   Hemoglobin 11.3 (*)    HCT 34.7 (*)    All other components within normal limits  URINALYSIS, ROUTINE W REFLEX MICROSCOPIC - Abnormal; Notable for the following:    Color, Urine STRAW (*)    All other components within normal limits  LIPASE, BLOOD  COMPREHENSIVE METABOLIC PANEL    EKG  EKG Interpretation None  Radiology No results found.  Procedures Procedures (including critical care time)  Medications Ordered in ED Medications  iopamidol (ISOVUE-300) 61 % injection (not administered)     Initial Impression / Assessment and Plan / ED Course  I have reviewed the triage vital signs and the nursing notes.  Pertinent labs & imaging results that were available during my care of the patient were reviewed by me and considered in my medical decision making (see chart for details).  Clinical Course   Patient is a 50 year old female presenting with worsening left lower quadrant pain for one week. On exam patient is afebrile, vital signs stable, and in no apparent distress. Heart and lung sounds are clear. Neuro exam normal. Cranial nerves III through XII intact. Negative pronator sign, negative figure nose, negative heel-to-shin, negative RAMs. Patient able to ambulate well without difficulty. Abdomen is soft.  Left lower quadrant is tender to palpation close to incision site. Slight guarding. Patient does not endorse any flank pain to me. Lab work looks otherwise normal. No  evidence of infectious processes. Urine is otherwise normal. Will order CT due to tenderness to palpation on exam.   At shift change care was transferred to [facility], PA-C who will follow pending studies, re-evaulate and determine disposition.     Final Clinical Impressions(s) / ED Diagnoses   Final diagnoses:  None    New Prescriptions New Prescriptions   No medications on file     [place], [place] 02/29/16 [place], MD 02/29/16 2008

## 2016-07-22 ENCOUNTER — Emergency Department (HOSPITAL_COMMUNITY): Payer: Self-pay

## 2016-07-22 ENCOUNTER — Encounter (HOSPITAL_COMMUNITY): Payer: Self-pay | Admitting: Emergency Medicine

## 2016-07-22 ENCOUNTER — Emergency Department (HOSPITAL_COMMUNITY)
Admission: EM | Admit: 2016-07-22 | Discharge: 2016-07-22 | Disposition: A | Discharge status: Home / Self Care | Payer: Self-pay | Attending: Emergency Medicine | Admitting: Emergency Medicine

## 2016-07-22 DIAGNOSIS — Z7982 Long term (current) use of aspirin: Secondary | ICD-10-CM | POA: Insufficient documentation

## 2016-07-22 DIAGNOSIS — Z79899 Other long term (current) drug therapy: Secondary | ICD-10-CM | POA: Insufficient documentation

## 2016-07-22 DIAGNOSIS — R0789 Other chest pain: Secondary | ICD-10-CM | POA: Insufficient documentation

## 2016-07-22 LAB — COMPREHENSIVE METABOLIC PANEL
ALK PHOS: 103 U/L (ref 38–126)
ALT: 33 U/L (ref 14–54)
AST: 18 U/L (ref 15–41)
Albumin: 3.9 g/dL (ref 3.5–5.0)
Anion gap: 8 (ref 5–15)
BILIRUBIN TOTAL: 0.6 mg/dL (ref 0.3–1.2)
BUN: 9 mg/dL (ref 6–20)
CALCIUM: 10 mg/dL (ref 8.9–10.3)
CO2: 23 mmol/L (ref 22–32)
CREATININE: 0.75 mg/dL (ref 0.44–1.00)
Chloride: 107 mmol/L (ref 101–111)
Glucose, Bld: 97 mg/dL (ref 65–99)
Potassium: 4.2 mmol/L (ref 3.5–5.1)
Sodium: 138 mmol/L (ref 135–145)
TOTAL PROTEIN: 7.6 g/dL (ref 6.5–8.1)

## 2016-07-22 LAB — URINALYSIS, ROUTINE W REFLEX MICROSCOPIC
BILIRUBIN URINE: NEGATIVE
Glucose, UA: NEGATIVE mg/dL
Hgb urine dipstick: NEGATIVE
KETONES UR: NEGATIVE mg/dL
Leukocytes, UA: NEGATIVE
NITRITE: NEGATIVE
Protein, ur: NEGATIVE mg/dL
SPECIFIC GRAVITY, URINE: 1.009 (ref 1.005–1.030)
pH: 7 (ref 5.0–8.0)

## 2016-07-22 LAB — LIPASE, BLOOD: LIPASE: 29 U/L (ref 11–51)

## 2016-07-22 LAB — CBC
HEMATOCRIT: 37.8 % (ref 36.0–46.0)
HEMOGLOBIN: 12.1 g/dL (ref 12.0–15.0)
MCH: 26.9 pg (ref 26.0–34.0)
MCHC: 32 g/dL (ref 30.0–36.0)
MCV: 84.2 fL (ref 78.0–100.0)
Platelets: 330 10*3/uL (ref 150–400)
RBC: 4.49 MIL/uL (ref 3.87–5.11)
RDW: 14 % (ref 11.5–15.5)
WBC: 4.9 10*3/uL (ref 4.0–10.5)

## 2016-07-22 LAB — TROPONIN I: Troponin I: <0.03 ng/mL (ref <0.03)

## 2016-07-22 NOTE — ED Provider Notes (Signed)
[facility] DEPT Provider Note   CSN: [id number] Arrival date & time: 07/22/16  0755     History   Chief Complaint Chief Complaint  Patient presents with  . Chest Pain  . Back Pain    HPI Rhylin Ramani Riva is a 51 y.o. female.  51 year old female with history of depression presents with one-day history of intermittent midsternal sharp chest pain without associated diaphoresis, dizziness, nausea, dyspnea. Symptoms wax and wane and nothing makes them better worse. No tumor for this use prior to arrival. Denies any pleuritic component to this. No recent leg pain or swelling. Patient had a stress test done last year which she says was negative and she is also recently completed a Holter monitor for palpitations. Although she denies any currently.  She also has a secondary complaint of left-sided flank pain without dysuria or hematuria. No rashes noted there. That pain also waxes and wanes. It is not worse with movement. Does not have the radicular symptoms. No bowel or bladder dysfunction.      Past Medical History:  Diagnosis Date  . Anemia   . Anemia   . Anxiety   . Fibroids   . GERD (gastroesophageal reflux disease)   . Heart murmur   . Hemorrhoids   . Obesity     Patient Active Problem List   Diagnosis Date Noted  . Symptomatic anemia 02/26/2015  . Vaginal bleeding 02/26/2015  . Atypical chest pain 02/08/2013  . Chest pain 12/10/2010  . W 12/10/2010  . Palpitations 12/10/2010  . GERD (gastroesophageal reflux disease) 10/18/2010    Past Surgical History:  Procedure Laterality Date  . ABDOMINAL HYSTERECTOMY      OB History    Gravida Para Term Preterm AB Living   1 1 1     1    SAB TAB Ectopic Multiple Live Births                   Home Medications    Prior to Admission medications   Medication Sig Start Date End Date Taking? Authorizing Provider  aspirin 325 MG tablet Take 325 mg by mouth every 4 (four) hours as needed for mild pain.    [provider]  calcium carbonate (TUMS - DOSED IN MG ELEMENTAL CALCIUM) 500 MG chewable tablet Chew 2 tablets by mouth as needed for indigestion or heartburn.    [provider]  cyclobenzaprine (FLEXERIL) 5 MG tablet Take 2 tablets (10 mg total) by mouth 3 (three) times daily as needed for muscle spasms. Patient not taking: Reported on 02/04/2016 10/31/15   Virgel Manifold, MD  ferrous sulfate 325 (65 FE) MG tablet Take 325 mg by mouth 3 (three) times daily with meals.     [provider]  megestrol (MEGACE) 40 MG tablet Take 1 tablet (40 mg total) by mouth 2 (two) times daily. Patient not taking: Reported on 02/04/2016 06/10/15   Seabron Spates, CNM  omeprazole (PRILOSEC) 40 MG capsule Take 40 mg by mouth daily.    [provider]    Family History Family History  Problem Relation Age of Onset  . Breast cancer Maternal Aunt   . Heart disease Maternal Aunt   . Hypertension Mother   . Renal Disease Mother   . Colon cancer Maternal Uncle   . Pancreatic cancer Maternal Grandmother   . Cancer Maternal Grandmother        pancreas  . Diabetes Maternal Uncle     Social History Social History  Substance Use Topics  . Smoking status: Never Smoker  . Smokeless tobacco: Never Used  . Alcohol use Yes     Comment: occasionally     Allergies   Shellfish allergy and Flagyl [metronidazole hcl]   Review of Systems Review of Systems  All other systems reviewed and are negative.    Physical Exam Updated Vital Signs BP 138/88   Pulse 84   Temp 97.9 F (36.6 C) (Oral)   Resp 15   LMP 06/10/2015 Comment: irreg bleeding, last was on 4/3  SpO2 100%   Physical Exam  Constitutional: She is oriented to person, place, and time. She appears well-developed and well-nourished.  Non-toxic appearance. No distress.  HENT:  Head: Normocephalic and atraumatic.  Eyes: Conjunctivae, EOM and lids are normal. Pupils are equal, round, and reactive to light.  Neck: Normal  range of motion. Neck supple. No tracheal deviation present. No thyroid mass present.  Cardiovascular: Normal rate, regular rhythm and normal heart sounds.  Exam reveals no gallop.   No murmur heard. Pulmonary/Chest: Effort normal and breath sounds normal. No stridor. No respiratory distress. She has no decreased breath sounds. She has no wheezes. She has no rhonchi. She has no rales.  Abdominal: Soft. Normal appearance and bowel sounds are normal. She exhibits no distension. There is no tenderness. There is no rebound and no CVA tenderness.  Musculoskeletal: Normal range of motion. She exhibits no edema or tenderness.  Neurological: She is alert and oriented to person, place, and time. She has normal strength. No cranial nerve deficit or sensory deficit. GCS eye subscore is 4. GCS verbal subscore is 5. GCS motor subscore is 6.  Skin: Skin is warm and dry. No abrasion and no rash noted.  Psychiatric: She has a normal mood and affect. Her speech is normal and behavior is normal.  Nursing note and vitals reviewed.    ED Treatments / Results  Labs (all labs ordered are listed, but only abnormal results are displayed) Labs Reviewed  CBC  COMPREHENSIVE METABOLIC PANEL  LIPASE, BLOOD  TROPONIN I  URINALYSIS, ROUTINE W REFLEX MICROSCOPIC    EKG  EKG Interpretation  Date/Time:  Monday Jul 22 2016 08:02:54 EDT Ventricular Rate:  79 PR Interval:    QRS Duration: 92 QT Interval:  372 QTC Calculation: 427 R Axis:   87 Text Interpretation:  Sinus rhythm Borderline T abnormalities, inferior leads Baseline wander in lead(s) II III aVR aVF V1 V2 V6 No significant change since last tracing Confirmed by Zenia Resides  MD, Harlean Regula ([id number]) on 07/22/2016 8:25:10 AM       Radiology Dg Chest 2 View  Result Date: 07/22/2016 CLINICAL DATA:  Chest pain. EXAM: CHEST  2 VIEW COMPARISON:  10/31/2015 FINDINGS: The cardiomediastinal silhouette is within normal limits. The lungs are well inflated and clear aside  from minimal left midlung scarring. There is no evidence of pleural effusion or pneumothorax. No acute osseous abnormality is identified. IMPRESSION: No active cardiopulmonary disease. Electronically Signed   By: Logan Bores M.D.   On: 07/22/2016 08:23    Procedures Procedures (including critical care time)  Medications Ordered in ED Medications - No data to display   Initial Impression / Assessment and Plan / ED Course  I have reviewed the triage vital signs and the nursing notes.  Pertinent labs & imaging results that were available during my care of the patient were reviewed by me and considered in my medical decision making (see chart for details).  Patient's lab work is reassuring. Does admit to increased stress and anxiety. Return causes given  Final Clinical Impressions(s) / ED Diagnoses   Final diagnoses:  None    New Prescriptions New Prescriptions   No medications on file     Lacretia Leigh, MD 07/22/16 [phone]

## 2016-07-22 NOTE — ED Triage Notes (Signed)
Pt verbalizes intermittent central chest sharpness onset 1500 yesterday with associated awakening with heart racing throughout the night. Pt continues to verbalizes left mid back pain without GU symptoms onset last week.

## 2016-10-09 ENCOUNTER — Emergency Department (HOSPITAL_COMMUNITY): Payer: Non-veteran care

## 2016-10-09 ENCOUNTER — Emergency Department (HOSPITAL_COMMUNITY)
Admission: EM | Admit: 2016-10-09 | Discharge: 2016-10-09 | Disposition: A | Discharge status: Home / Self Care | Payer: Non-veteran care | Attending: Emergency Medicine | Admitting: Emergency Medicine

## 2016-10-09 ENCOUNTER — Encounter (HOSPITAL_COMMUNITY): Payer: Self-pay | Admitting: Emergency Medicine

## 2016-10-09 DIAGNOSIS — M545 Low back pain: Secondary | ICD-10-CM | POA: Insufficient documentation

## 2016-10-09 DIAGNOSIS — R200 Anesthesia of skin: Secondary | ICD-10-CM | POA: Insufficient documentation

## 2016-10-09 DIAGNOSIS — R202 Paresthesia of skin: Secondary | ICD-10-CM | POA: Diagnosis not present

## 2016-10-09 DIAGNOSIS — M79604 Pain in right leg: Secondary | ICD-10-CM | POA: Diagnosis present

## 2016-10-09 DIAGNOSIS — Z79899 Other long term (current) drug therapy: Secondary | ICD-10-CM | POA: Diagnosis not present

## 2016-10-09 DIAGNOSIS — R1031 Right lower quadrant pain: Secondary | ICD-10-CM

## 2016-10-09 DIAGNOSIS — N83202 Unspecified ovarian cyst, left side: Secondary | ICD-10-CM | POA: Diagnosis not present

## 2016-10-09 LAB — URINALYSIS, ROUTINE W REFLEX MICROSCOPIC
BILIRUBIN URINE: NEGATIVE
Glucose, UA: NEGATIVE mg/dL
Hgb urine dipstick: NEGATIVE
KETONES UR: NEGATIVE mg/dL
LEUKOCYTES UA: NEGATIVE
NITRITE: NEGATIVE
PROTEIN: NEGATIVE mg/dL
Specific Gravity, Urine: 1.015 (ref 1.005–1.030)
pH: 5 (ref 5.0–8.0)

## 2016-10-09 LAB — BASIC METABOLIC PANEL
Anion gap: 5 (ref 5–15)
BUN: 9 mg/dL (ref 6–20)
CHLORIDE: 109 mmol/L (ref 101–111)
CO2: 25 mmol/L (ref 22–32)
CREATININE: 0.81 mg/dL (ref 0.44–1.00)
Calcium: 10 mg/dL (ref 8.9–10.3)
GFR calc Af Amer: >60 mL/min (ref >60)
GFR calc non Af Amer: >60 mL/min (ref >60)
GLUCOSE: 88 mg/dL (ref 65–99)
Potassium: 4 mmol/L (ref 3.5–5.1)
Sodium: 139 mmol/L (ref 135–145)

## 2016-10-09 LAB — WET PREP, GENITAL
CLUE CELLS WET PREP: NONE SEEN
Sperm: NONE SEEN
TRICH WET PREP: NONE SEEN
WBC, Wet Prep HPF POC: NONE SEEN
YEAST WET PREP: NONE SEEN

## 2016-10-09 LAB — CBC WITH DIFFERENTIAL/PLATELET
Basophils Absolute: 0 10*3/uL (ref 0.0–0.1)
Basophils Relative: 0 %
Eosinophils Absolute: 0.1 10*3/uL (ref 0.0–0.7)
Eosinophils Relative: 2 %
HEMATOCRIT: 37.1 % (ref 36.0–46.0)
Hemoglobin: 12 g/dL (ref 12.0–15.0)
LYMPHS ABS: 1.7 10*3/uL (ref 0.7–4.0)
LYMPHS PCT: 26 %
MCH: 27.4 pg (ref 26.0–34.0)
MCHC: 32.3 g/dL (ref 30.0–36.0)
MCV: 84.7 fL (ref 78.0–100.0)
MONO ABS: 0.2 10*3/uL (ref 0.1–1.0)
MONOS PCT: 4 %
NEUTROS ABS: 4.3 10*3/uL (ref 1.7–7.7)
Neutrophils Relative %: 68 %
Platelets: 310 10*3/uL (ref 150–400)
RBC: 4.38 MIL/uL (ref 3.87–5.11)
RDW: 14 % (ref 11.5–15.5)
WBC: 6.3 10*3/uL (ref 4.0–10.5)

## 2016-10-09 MED ORDER — IOPAMIDOL (ISOVUE-300) INJECTION 61%
INTRAVENOUS | Status: DC
Start: 2016-10-09 — End: 2016-10-09
  Filled 2016-10-09: qty 100

## 2016-10-09 MED ORDER — MORPHINE SULFATE (PF) 4 MG/ML IV SOLN
4.0000 mg | Freq: Once | INTRAVENOUS | Status: AC
  Administered 2016-10-09: 4 mg via INTRAVENOUS
  Filled 2016-10-09: qty 1

## 2016-10-09 MED ORDER — IOPAMIDOL (ISOVUE-300) INJECTION 61%
INTRAVENOUS | Status: AC
  Filled 2016-10-09: qty 30

## 2016-10-09 MED ORDER — GADOBENATE DIMEGLUMINE 529 MG/ML IV SOLN
20.0000 mL | Freq: Once | INTRAVENOUS | Status: AC | PRN
  Administered 2016-10-09: 20 mL via INTRAVENOUS

## 2016-10-09 MED ORDER — IOPAMIDOL (ISOVUE-300) INJECTION 61%
100.0000 mL | Freq: Once | INTRAVENOUS | Status: AC | PRN
  Administered 2016-10-09: 100 mL via INTRAVENOUS

## 2016-10-09 MED ORDER — NAPROXEN 500 MG PO TABS: 500.0000 mg | ORAL_TABLET | Freq: Two times a day (BID) | ORAL | 0 refills | Status: DC

## 2016-10-09 NOTE — ED Triage Notes (Signed)
Patient reports that week ago having pain in upper medial thigh that radiates to posterior upper leg. Patient also reports numbness and tingling started last night with pain that radiates down leg.

## 2016-10-09 NOTE — ED Provider Notes (Signed)
Discussed CT scan with patient.   5 cm ovarian cyst noted on the left side. Pt is having pain on the right.   Discussed need for outpatient pelvic ultrasound and OB GYN follow up.  Explained to patient the importance of further evaluation of this lesion to make sure it is not malignant.   Dorie Rank, MD 10/09/16 [phone]

## 2016-10-09 NOTE — ED Notes (Signed)
Bed: WA05 Expected date:  Expected time:  Means of arrival:  Comments: 

## 2016-10-09 NOTE — ED Provider Notes (Signed)
[facility] DEPT Provider Note   CSN: [id number] Arrival date & time: 10/09/16  5176     History   Chief Complaint Chief Complaint  Patient presents with  . Leg Pain    HPI Theresa Peterson is a 51 y.o. female.  The history is provided by the patient.  Leg Pain   This is a new problem. The current episode started yesterday. The problem occurs constantly. The problem has not changed since onset.The pain is present in the right upper leg and right lower leg. The pain is moderate. Associated symptoms include numbness and tingling. She has tried nothing for the symptoms. There has been no history of extremity trauma.     51 year old female who presents with right groin pain shooting to the right lower extremity starting suddenly yesterday evening. It is also associated with low back pain, worse with ambulation and sitting down. Denies any fall, trauma, heavy lifting or exertional activity. States that yesterday evening, she did feel numbness and tingling in her perineal and perianal area. Also associated with mild weakness in the right leg, but she feels that this is the pain related. Endorses tingling down the lateral aspect of the right leg. she has occasional urinary incontinence related to previous hysterectomy, but nothing in terms of stool incontinence or urinary retention.  Past Medical History:  Diagnosis Date  . Anemia   . Anemia   . Anxiety   . Fibroids   . GERD (gastroesophageal reflux disease)   . Heart murmur   . Hemorrhoids   . Obesity     Patient Active Problem List   Diagnosis Date Noted  . Symptomatic anemia 02/26/2015  . Vaginal bleeding 02/26/2015  . Atypical chest pain 02/08/2013  . Chest pain 12/10/2010  . W 12/10/2010  . Palpitations 12/10/2010  . GERD (gastroesophageal reflux disease) 10/18/2010    Past Surgical History:  Procedure Laterality Date  . ABDOMINAL HYSTERECTOMY      OB History    Gravida Para Term Preterm AB Living   1 1 1      1    SAB TAB Ectopic Multiple Live Births                   Home Medications    Prior to Admission medications   Medication Sig Start Date End Date Taking? Authorizing Provider  aspirin 325 MG tablet Take 325-650 mg by mouth every 4 (four) hours as needed for mild pain, moderate pain, fever or headache.   Yes [provider]  cyclobenzaprine (FLEXERIL) 5 MG tablet Take 2 tablets (10 mg total) by mouth 3 (three) times daily as needed for muscle spasms. 10/31/15  Yes Virgel Manifold, MD  ferrous sulfate 325 (65 FE) MG tablet Take 325 mg by mouth daily.    Yes [provider]  ibuprofen (ADVIL,MOTRIN) 800 MG tablet Take 800 mg by mouth every 8 (eight) hours as needed for moderate pain.   Yes [provider]  Multiple Vitamin (MULTIVITAMIN WITH MINERALS) TABS tablet Take 1 tablet by mouth daily.   Yes [provider]  omeprazole (PRILOSEC) 40 MG capsule Take 40 mg by mouth daily.   Yes [provider]  megestrol (MEGACE) 40 MG tablet Take 1 tablet (40 mg total) by mouth 2 (two) times daily. Patient not taking: Reported on 02/04/2016 06/10/15   Seabron Spates, CNM    Family History Family History  Problem Relation Age of Onset  . Breast cancer Maternal Aunt   .  Heart disease Maternal Aunt   . Hypertension Mother   . Renal Disease Mother   . Colon cancer Maternal Uncle   . Pancreatic cancer Maternal Grandmother   . Cancer Maternal Grandmother        pancreas  . Diabetes Maternal Uncle     Social History Social History  Substance Use Topics  . Smoking status: Never Smoker  . Smokeless tobacco: Never Used  . Alcohol use Yes     Comment: occasionally     Allergies   Shellfish allergy; Avocado; and Flagyl [metronidazole hcl]   Review of Systems Review of Systems  Constitutional: Negative for fever.  Respiratory: Negative for shortness of breath.   Cardiovascular: Negative for chest pain.  Gastrointestinal: Negative for abdominal  pain.  Allergic/Immunologic: Negative for immunocompromised state.  Neurological: Positive for tingling and numbness.  Hematological: Does not bruise/bleed easily.  All other systems reviewed and are negative.    Physical Exam Updated Vital Signs BP 129/76 (BP Location: Left Arm)   Pulse 66   Temp 98.2 F (36.8 C) (Oral)   Resp 18   Ht 5\' 4"  (1.626 m)   Wt 113.4 kg (250 lb)   LMP 06/10/2015 Comment: irreg bleeding, last was on 4/3  SpO2 97%   BMI 42.91 kg/m   Physical Exam Physical Exam  Nursing note and vitals reviewed. Constitutional: Well developed, well nourished, non-toxic, and in no acute distress Head: Normocephalic and atraumatic.  Mouth/Throat: Oropharynx is clear and moist.  Neck: Normal range of motion. Neck supple.  Cardiovascular: Normal rate and regular rhythm.   Pulmonary/Chest: Effort normal and breath sounds normal.  Abdominal: Soft. There is no tenderness. There is no rebound and no guarding. normal rectal tone Musculoskeletal: Normal range of motion.  Neurological: Alert, no facial droop, fluent speech, mild hyperreflexia noted in patellar and Achilles reflexes on the left, normal patellar and Achilles reflex in the right lower extremity, reports tingling over the lateral aspect of the right calf, remainder sensation to light touch intact throughout, slight weakness in the right hip flexion, but remainder of strength intact bilaterally in hip extension, abduction and adduction, knee flexion/extension, ankle dorsiflexion/plantar flexion Skin: Skin is warm and dry.  Psychiatric: Cooperative Pelvic: Normal external genitalia. Normal internal genitalia. No discharge. No blood within the vagina. No cervical motion tenderness. No adnexal masses. Mild right adnexal tenderness.    ED Treatments / Results  Labs (all labs ordered are listed, but only abnormal results are displayed) Labs Reviewed  WET PREP, GENITAL  CBC WITH DIFFERENTIAL/PLATELET  BASIC METABOLIC  PANEL  URINALYSIS, ROUTINE W REFLEX MICROSCOPIC    EKG  EKG Interpretation None       Radiology Mr Thoracic Spine W Wo Contrast  Result Date: 10/09/2016 CLINICAL DATA:  51 year old female with hyper reflexia. Pain radiating down the leg. Saddle anesthesia. EXAM: MRI THORACIC AND LUMBAR SPINE WITHOUT AND WITH CONTRAST TECHNIQUE: Multiplanar and multiecho pulse sequences of the thoracic and lumbar spine were obtained without and with intravenous contrast. CONTRAST:  86mL MULTIHANCE GADOBENATE DIMEGLUMINE 529 MG/ML IV SOLN COMPARISON:  Chest radiographs 07/22/2016. CT Abdomen and Pelvis 02/29/2016. FINDINGS: MRI THORACIC SPINE FINDINGS Limited sagittal: Straightening of cervical lordosis. Imaging of the cervical spine Segmentation:  Appears normal. Alignment: Normal aside from mildly exaggerated thoracic kyphosis. No spondylolisthesis or scoliosis. Vertebrae: Preserved thoracic vertebral body height. Nonspecific abnormal decreased T1 marrow signal throughout the thoracic spine, and also suggested throughout the visible cervical spine. However, no associated marrow edema or suspicious thoracic vertebral  enhancement. Cord: Negative visualized lower cervical spinal cord (series 8, image 6). Spinal cord signal is within normal limits at all visualized levels. No abnormal intradural enhancement. The conus medullaris occurs below T12-L1. Paraspinal and other soft tissues: Small bilateral layering pleural effusions. Small gastric hiatal hernia. Negative visualized other thoracic viscera. Negative visualized upper abdominal viscera. Negative visualized posterior paraspinal soft tissues. Disc levels: T1-T2: Negative. T2-T3: Negative. T3-T4: Negative. T4-T5: Mild facet and ligament flavum hypertrophy.  No stenosis. T5-T6: Mild to moderate facet and ligament flavum hypertrophy greater on the right. Mild to moderate right T5 foraminal stenosis (series 8, image 4). T6-T7: Mild to moderate facet and ligament flavum  hypertrophy. No stenosis. T7-T8: Mild ligament flavum hypertrophy.  No stenosis. T8-T9: Mild facet and ligament flavum hypertrophy. Mild bilateral T8 neural foraminal stenosis. T9-T10: Mild facet and ligament flavum hypertrophy.  No stenosis. T10-T11: Mild facet hypertrophy.  No stenosis. T11-T12: Mild facet hypertrophy.  No stenosis. MRI LUMBAR SPINE FINDINGS Segmentation:  Normal. Alignment: Stable since 2017 and normal except for mild retrolisthesis of L5 on S1. Vertebrae: Heterogeneous T1 bone marrow signal similar to that in the thoracic spine, but less pronounced in the lower lumbar spine, visible sacrum and pelvis. No associated marrow edema or suspicious marrow enhancement. Conus medullaris: Extends to the L1-L2 level and appears normal. No abnormal intradural enhancement. No dural thickening. Paraspinal and other soft tissues: Large volume of abnormal fluid in the lower abdomen and tracking into the pelvis. See series 16 image 9 and series 17, image 19. It is unclear whether this is intraperitoneal or retroperitoneal fluid. No internal septations or associated enhancement identified. Negative visualized upper abdominal viscera. Large body habitus. Negative visualized posterior paraspinal soft tissues. Disc levels: T12-L1:  Mild facet hypertrophy.  No stenosis. L1-L2:  Negative. L2-L3:  Mild epidural lipomatosis.  No stenosis. L3-L4: Mild mostly far lateral disc bulging. Mild epidural lipomatosis. Mild to moderate facet and ligament flavum hypertrophy greater on the right. Trace left facet joint fluid. Mild spinal stenosis (series 17, image 17). L4-L5: Disc desiccation. Circumferential disc bulge with broad-based posterior component. Epidural lipomatosis. Moderate facet and ligament flavum hypertrophy. Moderate spinal stenosis (series 17, image 22). L5-S1: Disc desiccation. Right eccentric circumferential disc bulge with broad-based posterior component. Moderate facet and ligament flavum hypertrophy.  Epidural lipomatosis. Borderline to mild spinal stenosis. Mild right greater than left L5 foraminal stenosis. IMPRESSION: 1. Recommend follow-up CT Abdomen and Pelvis with oral and IV contrast: Moderate to large volume of abnormal fluid in the lower abdomen and pelvis. It is unclear whether this fluid is in the retroperitoneal or intraperitoneal space. Superimposed small bilateral layering pleural effusions. 2. 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 No other thoracic or lumbar spinal stenosis. 3. Normal lower cervical and thoracic spinal cord. 4. Abnormal but nonspecific decreased bone marrow signal diffusely. However, there is no enhancing or destructive osseous lesion identified. Therefore favor benign etiology such as sequelae of anemia, smoking, or obesity. 5. Thoracic spine facet degeneration, resulting in up to moderate right T5 and bilateral T8 neural foraminal stenosis. Electronically Signed   By: Genevie Ann M.D.   On: 10/09/2016 14:42   Mr Lumbar Spine W Wo Contrast  Result Date: 10/09/2016 CLINICAL DATA:  51 year old female with hyper reflexia. Pain radiating down the leg. Saddle anesthesia. EXAM: MRI THORACIC AND LUMBAR SPINE WITHOUT AND WITH CONTRAST TECHNIQUE: Multiplanar and multiecho pulse sequences of the thoracic and lumbar spine were obtained without and with intravenous contrast. CONTRAST:  47mL MULTIHANCE GADOBENATE DIMEGLUMINE 529 MG/ML IV SOLN COMPARISON:  Chest radiographs 07/22/2016. CT Abdomen and Pelvis 02/29/2016. FINDINGS: MRI THORACIC SPINE FINDINGS Limited sagittal: Straightening of cervical lordosis. Imaging of the cervical spine Segmentation:  Appears normal. Alignment: Normal aside from mildly exaggerated thoracic kyphosis. No spondylolisthesis or scoliosis. Vertebrae: Preserved thoracic vertebral body height. Nonspecific abnormal decreased T1 marrow signal throughout the thoracic spine, and also suggested throughout the visible  cervical spine. However, no associated marrow edema or suspicious thoracic vertebral enhancement. Cord: Negative visualized lower cervical spinal cord (series 8, image 6). Spinal cord signal is within normal limits at all visualized levels. No abnormal intradural enhancement. The conus medullaris occurs below T12-L1. Paraspinal and other soft tissues: Small bilateral layering pleural effusions. Small gastric hiatal hernia. Negative visualized other thoracic viscera. Negative visualized upper abdominal viscera. Negative visualized posterior paraspinal soft tissues. Disc levels: T1-T2: Negative. T2-T3: Negative. T3-T4: Negative. T4-T5: Mild facet and ligament flavum hypertrophy.  No stenosis. T5-T6: Mild to moderate facet and ligament flavum hypertrophy greater on the right. Mild to moderate right T5 foraminal stenosis (series 8, image 4). T6-T7: Mild to moderate facet and ligament flavum hypertrophy. No stenosis. T7-T8: Mild ligament flavum hypertrophy.  No stenosis. T8-T9: Mild facet and ligament flavum hypertrophy. Mild bilateral T8 neural foraminal stenosis. T9-T10: Mild facet and ligament flavum hypertrophy.  No stenosis. T10-T11: Mild facet hypertrophy.  No stenosis. T11-T12: Mild facet hypertrophy.  No stenosis. MRI LUMBAR SPINE FINDINGS Segmentation:  Normal. Alignment: Stable since 2017 and normal except for mild retrolisthesis of L5 on S1. Vertebrae: Heterogeneous T1 bone marrow signal similar to that in the thoracic spine, but less pronounced in the lower lumbar spine, visible sacrum and pelvis. No associated marrow edema or suspicious marrow enhancement. Conus medullaris: Extends to the L1-L2 level and appears normal. No abnormal intradural enhancement. No dural thickening. Paraspinal and other soft tissues: Large volume of abnormal fluid in the lower abdomen and tracking into the pelvis. See series 16 image 9 and series 17, image 19. It is unclear whether this is intraperitoneal or retroperitoneal fluid.  No internal septations or associated enhancement identified. Negative visualized upper abdominal viscera. Large body habitus. Negative visualized posterior paraspinal soft tissues. Disc levels: T12-L1:  Mild facet hypertrophy.  No stenosis. L1-L2:  Negative. L2-L3:  Mild epidural lipomatosis.  No stenosis. L3-L4: Mild mostly far lateral disc bulging. Mild epidural lipomatosis. Mild to moderate facet and ligament flavum hypertrophy greater on the right. Trace left facet joint fluid. Mild spinal stenosis (series 17, image 17). L4-L5: Disc desiccation. Circumferential disc bulge with broad-based posterior component. Epidural lipomatosis. Moderate facet and ligament flavum hypertrophy. Moderate spinal stenosis (series 17, image 22). L5-S1: Disc desiccation. Right eccentric circumferential disc bulge with broad-based posterior component. Moderate facet and ligament flavum hypertrophy. Epidural lipomatosis. Borderline to mild spinal stenosis. Mild right greater than left L5 foraminal stenosis. IMPRESSION: 1. Recommend follow-up CT Abdomen and Pelvis with oral and IV contrast: Moderate to large volume of abnormal fluid in the lower abdomen and pelvis. It is unclear whether this fluid is in the retroperitoneal or intraperitoneal space. Superimposed small bilateral layering pleural effusions. 2. 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 No other thoracic or lumbar spinal stenosis. 3. Normal lower cervical and thoracic spinal cord. 4. Abnormal but nonspecific decreased bone marrow signal diffusely. However, there is no enhancing or destructive osseous lesion identified. Therefore favor benign etiology such as sequelae of anemia, smoking, or obesity. 5. Thoracic spine facet degeneration, resulting in  up to moderate right T5 and bilateral T8 neural foraminal stenosis. Electronically Signed   By: Genevie Ann M.D.   On: 10/09/2016 14:42    Procedures Procedures (including critical  care time)  Medications Ordered in ED Medications  iopamidol (ISOVUE-300) 61 % injection (not administered)  morphine 4 MG/ML injection 4 mg (4 mg Intravenous Given 10/09/16 1208)  gadobenate dimeglumine (MULTIHANCE) injection 20 mL (20 mLs Intravenous Contrast Given 10/09/16 1417)     Initial Impression / Assessment and Plan / ED Course  I have reviewed the triage vital signs and the nursing notes.  Pertinent labs & imaging results that were available during my care of the patient were reviewed by me and considered in my medical decision making (see chart for details).     Presents with back pain, groin pain with radiation down the right lower extremity. Neuro in tact, but reports tingling over the right calf. Normal rectal tone. She states maybe some tingling around the anal and perineum, but now she is unsure if it is completely back to normal. Remainder of neuro exam in tact. LLE reflexes more prominent than RLE reflexes, and question mild right hip flexor weakness but felt to be related to pain.  MRI thoracic and lumbar spine visualized, and shows no spinal cord compression. There is possible intraabdominal versus retroperitoneal fluid. I subsequently ordered CT abdomen and pelvis for evaluation. Given that she did have pain in the right groin and there is mild right pelvic tenderness to perform pelvic exam, but other than tenderness there is no acute findings. Wet prep is normal.  Dr. Tomi Bamberger to follow-up on CT and disposition appropriately.    Final Clinical Impressions(s) / ED Diagnoses   Final diagnoses:  Right leg pain  Right inguinal pain    New Prescriptions New Prescriptions   No medications on file     Forde Dandy, MD 10/09/16 [phone]

## 2016-10-09 NOTE — ED Notes (Signed)
Post void Residual: 229mL

## 2016-10-09 NOTE — ED Notes (Signed)
Patient transported to MRI 

## 2016-10-09 NOTE — Discharge Instructions (Signed)
Follow up with a gynecologist to evaluate the cyst noted on the CT scan further.  The radiologist recommended an outpatient ultrasound to evaluate the cyst further

## 2016-10-10 ENCOUNTER — Telehealth (HOSPITAL_COMMUNITY): Payer: Self-pay | Admitting: Emergency Medicine

## 2016-10-10 NOTE — Telephone Encounter (Signed)
GC not entered, will enter now.  Some difficulty as patient has been D/c'd >2 hours ago, not letting me enter as inpatient.  Tried to do as phone note, though order as outpatient without [employer] listed as lab.

## 2016-10-14 LAB — CERVICOVAGINAL ANCILLARY ONLY
Chlamydia: NEGATIVE
Neisseria Gonorrhea: NEGATIVE

## 2016-12-29 ENCOUNTER — Emergency Department (HOSPITAL_COMMUNITY)
Admission: EM | Admit: 2016-12-29 | Discharge: 2016-12-29 | Disposition: A | Discharge status: Home / Self Care | Payer: Non-veteran care | Attending: Emergency Medicine | Admitting: Emergency Medicine

## 2016-12-29 ENCOUNTER — Encounter (HOSPITAL_COMMUNITY): Payer: Self-pay | Admitting: Emergency Medicine

## 2016-12-29 DIAGNOSIS — N83202 Unspecified ovarian cyst, left side: Secondary | ICD-10-CM | POA: Insufficient documentation

## 2016-12-29 DIAGNOSIS — B9689 Other specified bacterial agents as the cause of diseases classified elsewhere: Secondary | ICD-10-CM | POA: Insufficient documentation

## 2016-12-29 DIAGNOSIS — N760 Acute vaginitis: Secondary | ICD-10-CM | POA: Insufficient documentation

## 2016-12-29 LAB — URINALYSIS, ROUTINE W REFLEX MICROSCOPIC
Bilirubin Urine: NEGATIVE
GLUCOSE, UA: NEGATIVE mg/dL
Hgb urine dipstick: NEGATIVE
KETONES UR: NEGATIVE mg/dL
LEUKOCYTES UA: NEGATIVE
NITRITE: NEGATIVE
PROTEIN: NEGATIVE mg/dL
Specific Gravity, Urine: 1.018 (ref 1.005–1.030)
pH: 5 (ref 5.0–8.0)

## 2016-12-29 LAB — WET PREP, GENITAL
SPERM: NONE SEEN
Trich, Wet Prep: NONE SEEN
Yeast Wet Prep HPF POC: NONE SEEN

## 2016-12-29 MED ORDER — ONDANSETRON 4 MG PO TBDP
4.0000 mg | ORAL_TABLET | Freq: Once | ORAL | Status: AC
  Administered 2016-12-29: 4 mg via ORAL
  Filled 2016-12-29: qty 1

## 2016-12-29 MED ORDER — PROMETHAZINE HCL 25 MG PO TABS
25.0000 mg | ORAL_TABLET | Freq: Four times a day (QID) | ORAL | 0 refills | Status: DC | PRN
Start: 2016-12-29 — End: 2017-11-22

## 2016-12-29 MED ORDER — METRONIDAZOLE 500 MG PO TABS
500.0000 mg | ORAL_TABLET | Freq: Once | ORAL | Status: AC
  Administered 2016-12-29: 500 mg via ORAL
  Filled 2016-12-29: qty 1

## 2016-12-29 MED ORDER — METRONIDAZOLE 500 MG PO TABS: 500.0000 mg | ORAL_TABLET | Freq: Two times a day (BID) | ORAL | 0 refills | Status: DC

## 2016-12-29 NOTE — ED Provider Notes (Signed)
[place] DEPT Provider Note   CSN: [id number] Arrival date & time: 12/29/16  1114     History   Chief Complaint Chief Complaint  Patient presents with  . Pelvic Pain    HPI Theresa Peterson is a 51 y.o. female.  Pt presents to the ED today with pelvic pain that has been going on for almost a year.  The pt was seen in the ED in October.  She had a ct abd/pelvis and a pelvic US which showed a left cystic lesion c/w possible ovarian cancer.  The pt is a [place] patient and she followed up with them.  The pt finally had a transvaginal US done this week at the [place].  The US showed similar left ovarian cyst and recommended gyn f/u.  Pt is here with continued pelvic pain and fullness.  The pt is requesting a referral to gyn.      Past Medical History:  Diagnosis Date  . Anemia   . Anemia   . Anxiety   . Fibroids   . GERD (gastroesophageal reflux disease)   . Heart murmur   . Hemorrhoids   . Obesity     Patient Active Problem List   Diagnosis Date Noted  . Symptomatic anemia 02/26/2015  . Vaginal bleeding 02/26/2015  . Atypical chest pain 02/08/2013  . Chest pain 12/10/2010  . W 12/10/2010  . Palpitations 12/10/2010  . GERD (gastroesophageal reflux disease) 10/18/2010    Past Surgical History:  Procedure Laterality Date  . ABDOMINAL HYSTERECTOMY      OB History    Gravida Para Term Preterm AB Living   1 1 1     1    SAB TAB Ectopic Multiple Live Births                   Home Medications    Prior to Admission medications   Medication Sig Start Date End Date Taking? Authorizing Provider  aspirin 325 MG tablet Take 325-650 mg by mouth every 4 (four) hours as needed for mild pain, moderate pain, fever or headache.   Yes [provider]  cyclobenzaprine (FLEXERIL) 5 MG tablet Take 2 tablets (10 mg total) by mouth 3 (three) times daily as needed for muscle spasms. 10/31/15  Yes Virgel Manifold, MD  ferrous sulfate 325 (65 FE)  MG tablet Take 325 mg by mouth daily.    Yes [provider]  ibuprofen (ADVIL,MOTRIN) 800 MG tablet Take 400-800 mg by mouth every 8 (eight) hours as needed for moderate pain.    Yes [provider]  Multiple Vitamin (MULTIVITAMIN WITH MINERALS) TABS tablet Take 1 tablet by mouth daily.   Yes [provider]  omeprazole (PRILOSEC) 40 MG capsule Take 40 mg by mouth daily.   Yes [provider]  megestrol (MEGACE) 40 MG tablet Take 1 tablet (40 mg total) by mouth 2 (two) times daily. Patient not taking: Reported on 02/04/2016 06/10/15   Seabron Spates, CNM  metroNIDAZOLE (FLAGYL) 500 MG tablet Take 1 tablet (500 mg total) by mouth 2 (two) times daily. 12/29/16   Isla Pence, MD  naproxen (NAPROSYN) 500 MG tablet Take 1 tablet (500 mg total) by mouth 2 (two) times daily. Patient not taking: Reported on 12/29/2016 10/09/16   Dorie Rank, MD  promethazine (PHENERGAN) 25 MG tablet Take 1 tablet (25 mg total) by mouth every 6 (six) hours as needed for nausea or vomiting. 12/29/16   Isla Pence, MD  Family History Family History  Problem Relation Age of Onset  . Breast cancer Maternal Aunt   . Heart disease Maternal Aunt   . Hypertension Mother   . Renal Disease Mother   . Colon cancer Maternal Uncle   . Pancreatic cancer Maternal Grandmother   . Cancer Maternal Grandmother        pancreas  . Diabetes Maternal Uncle     Social History Social History  Substance Use Topics  . Smoking status: Never Smoker  . Smokeless tobacco: Never Used  . Alcohol use Yes     Comment: occasionally     Allergies   Shellfish allergy; Avocado; and Flagyl [metronidazole hcl]   Review of Systems Review of Systems  Genitourinary: Positive for pelvic pain.  All other systems reviewed and are negative.    Physical Exam Updated Vital Signs BP 114/77 (BP Location: Left Arm)   Pulse 60   Temp 98.1 F (36.7 C) (Oral)   Resp 16   Ht 5\' 4"  (1.626 m)   Wt  115.7 kg (255 lb)   LMP 06/10/2015 Comment: irreg bleeding, last was on 4/3  SpO2 100%   BMI 43.77 kg/m   Physical Exam  Constitutional: She is oriented to person, place, and time. She appears well-developed and well-nourished.  HENT:  Head: Normocephalic and atraumatic.  Right Ear: External ear normal.  Left Ear: External ear normal.  Nose: Nose normal.  Mouth/Throat: Oropharynx is clear and moist.  Eyes: Pupils are equal, round, and reactive to light. Conjunctivae and EOM are normal.  Neck: Normal range of motion. Neck supple.  Cardiovascular: Normal rate, regular rhythm, normal heart sounds and intact distal pulses.   Pulmonary/Chest: Effort normal and breath sounds normal.  Abdominal: Soft. Bowel sounds are normal. There is tenderness in the left lower quadrant.  Musculoskeletal: Normal range of motion.  Neurological: She is alert and oriented to person, place, and time.  Skin: Skin is warm. Capillary refill takes less than 2 seconds.  Psychiatric: She has a normal mood and affect. Her behavior is normal. Judgment and thought content normal.  Nursing note and vitals reviewed.    ED Treatments / Results  Labs (all labs ordered are listed, but only abnormal results are displayed) Labs Reviewed  WET PREP, GENITAL - Abnormal; Notable for the following:       Result Value   Clue Cells Wet Prep HPF POC PRESENT (*)    WBC, Wet Prep HPF POC RARE (*)    All other components within normal limits  URINALYSIS, ROUTINE W REFLEX MICROSCOPIC  GC/CHLAMYDIA PROBE AMP () NOT AT [facility]    EKG  EKG Interpretation None       Radiology No results found.       The above is a report from the TV US done on 10/25.  The impression is an abnormal left ovary which is concerning for ovarian neoplasm.    Procedures Procedures (including critical care time)  Medications Ordered in ED Medications  metroNIDAZOLE (FLAGYL) tablet 500 mg (not administered)  ondansetron  (ZOFRAN-ODT) disintegrating tablet 4 mg (not administered)     Initial Impression / Assessment and Plan / ED Course  I have reviewed the triage vital signs and the nursing notes.  Pertinent labs & imaging results that were available during my care of the patient were reviewed by me and considered in my medical decision making (see chart for details).    Pt is aware she needs to f/u with gyn.  That is why she is here.  I gave her the number of the women's clinic.  Pt will also be treated for BV.  Pt knows to return if worse.   Final Clinical Impressions(s) / ED Diagnoses   Final diagnoses:  BV (bacterial vaginosis)  Cyst of left ovary    New Prescriptions New Prescriptions   METRONIDAZOLE (FLAGYL) 500 MG TABLET    Take 1 tablet (500 mg total) by mouth 2 (two) times daily.   PROMETHAZINE (PHENERGAN) 25 MG TABLET    Take 1 tablet (25 mg total) by mouth every 6 (six) hours as needed for nausea or vomiting.     Isla Pence, MD 12/29/16 [phone]

## 2016-12-29 NOTE — ED Triage Notes (Signed)
Patient reports having pelvic pain since December and seen here and at [place]. Patient reports had CT scan and [place] was told that could be malignant spot-patient brought pics on flash drive.  Patient wanting a referral to specialist.

## 2016-12-29 NOTE — ED Notes (Signed)
DC note: DC orders to home rec, pt teaching done re: (1) the Rx written by EDP, explained each med and use, enforced importance of completing abx as prescribed. (2) pt teaching done about handwashing importance, (3) refrain from sexual activity until bacterial issue has resolved, (4) also about proper female hygiene. Opportunity for questions provided. Teach Back Method used.

## 2016-12-30 LAB — GC/CHLAMYDIA PROBE AMP (~~LOC~~) NOT AT ARMC
Chlamydia: NEGATIVE
Neisseria Gonorrhea: NEGATIVE

## 2017-01-10 ENCOUNTER — Encounter (HOSPITAL_COMMUNITY): Payer: Self-pay

## 2017-02-02 ENCOUNTER — Encounter (HOSPITAL_COMMUNITY): Payer: Self-pay | Admitting: Emergency Medicine

## 2017-02-02 ENCOUNTER — Emergency Department (HOSPITAL_COMMUNITY): Payer: Non-veteran care

## 2017-02-02 ENCOUNTER — Emergency Department (HOSPITAL_COMMUNITY)
Admission: EM | Admit: 2017-02-02 | Discharge: 2017-02-02 | Disposition: A | Discharge status: Home / Self Care | Payer: Non-veteran care | Attending: Emergency Medicine | Admitting: Emergency Medicine

## 2017-02-02 DIAGNOSIS — R110 Nausea: Secondary | ICD-10-CM | POA: Insufficient documentation

## 2017-02-02 DIAGNOSIS — Z79899 Other long term (current) drug therapy: Secondary | ICD-10-CM | POA: Insufficient documentation

## 2017-02-02 DIAGNOSIS — R079 Chest pain, unspecified: Secondary | ICD-10-CM | POA: Diagnosis present

## 2017-02-02 DIAGNOSIS — R42 Dizziness and giddiness: Secondary | ICD-10-CM | POA: Diagnosis not present

## 2017-02-02 HISTORY — DX: Unspecified ovarian cyst, unspecified side: N83.209

## 2017-02-02 LAB — I-STAT TROPONIN, ED
TROPONIN I, POC: 0 ng/mL (ref 0.00–0.08)
Troponin i, poc: 0.01 ng/mL (ref 0.00–0.08)

## 2017-02-02 LAB — BASIC METABOLIC PANEL
Anion gap: 5 (ref 5–15)
BUN: 14 mg/dL (ref 6–20)
CALCIUM: 10.1 mg/dL (ref 8.9–10.3)
CHLORIDE: 106 mmol/L (ref 101–111)
CO2: 28 mmol/L (ref 22–32)
CREATININE: 0.84 mg/dL (ref 0.44–1.00)
Glucose, Bld: 90 mg/dL (ref 65–99)
Potassium: 3.8 mmol/L (ref 3.5–5.1)
SODIUM: 139 mmol/L (ref 135–145)

## 2017-02-02 LAB — I-STAT BETA HCG BLOOD, ED (MC, WL, AP ONLY)

## 2017-02-02 LAB — CBC
HCT: 36 % (ref 36.0–46.0)
Hemoglobin: 11.5 g/dL — ABNORMAL LOW (ref 12.0–15.0)
MCH: 27.2 pg (ref 26.0–34.0)
MCHC: 31.9 g/dL (ref 30.0–36.0)
MCV: 85.1 fL (ref 78.0–100.0)
PLATELETS: 269 10*3/uL (ref 150–400)
RBC: 4.23 MIL/uL (ref 3.87–5.11)
RDW: 14.2 % (ref 11.5–15.5)
WBC: 6.7 10*3/uL (ref 4.0–10.5)

## 2017-02-02 LAB — D-DIMER, QUANTITATIVE: D-Dimer, Quant: 0.41 ug/mL-FEU (ref 0.00–0.50)

## 2017-02-02 MED ORDER — SODIUM CHLORIDE 0.9 % IV BOLUS (SEPSIS)
1000.0000 mL | Freq: Once | INTRAVENOUS | Status: AC
  Administered 2017-02-02: 1000 mL via INTRAVENOUS

## 2017-02-02 MED ORDER — ONDANSETRON 4 MG PO TBDP: 4.0000 mg | ORAL_TABLET | Freq: Three times a day (TID) | ORAL | 0 refills | Status: DC | PRN

## 2017-02-02 NOTE — ED Notes (Signed)
Signature pad broken. Patient verbalizes understanding of d/c paperwork, prescription, and follow-up information.

## 2017-02-02 NOTE — ED Provider Notes (Signed)
[place] DEPT Provider Note   CSN: [id number] Arrival date & time: 02/02/17  1157     History   Chief Complaint Chief Complaint  Patient presents with  . Chest Pain    HPI Theresa Peterson is a 51 y.o. female.  HPI  Upper right chest pain for a few weeks Today had nausea and lightheadedness with left sided discomfort which was new Pain felt like a bloating in the chest, discomfort, like a tightness, located in left chest, started en route to ED, radiates down towards abdomen some.  Pain not worse with positions, deep breaths or exertion  Ovarian cyst with preop tomorrow to see if it is cancer, so not sure if the anxiety is contributing Thursday started using cpap, was doing well with it, feeling refreshed in AM  Reports dizziness, lightheadedness like going to pass out, nausea, that occurred after lunch then again as she was helping her mom with her bath, second time was more severe. No syncope. Initially didn't have chest discomfort but developed it en route to ED. Has had CTs which have showed fluid from the pelvis that is going up in abdomen and not sure if this is causing it.   Had some shortness of breath while she was lightheaded, otherwise has not had any dyspnea. No fevers, no cough.  Ibuprofen helps right sided pain.  No new leg swelling or pain. Has had knee injury in [place], has left leg pain from that at times.  Currently not having chest pain. Past Medical History:  Diagnosis Date  . Anemia   . Anemia   . Anxiety   . Fibroids   . GERD (gastroesophageal reflux disease)   . Heart murmur   . Hemorrhoids   . Obesity   . Ovarian cyst     Patient Active Problem List   Diagnosis Date Noted  . Symptomatic anemia 02/26/2015  . Vaginal bleeding 02/26/2015  . Atypical chest pain 02/08/2013  . Chest pain 12/10/2010  . W 12/10/2010  . Palpitations 12/10/2010  . GERD (gastroesophageal reflux disease) 10/18/2010    Past  Surgical History:  Procedure Laterality Date  . ABDOMINAL HYSTERECTOMY      OB History    Gravida Para Term Preterm AB Living   1 1 1     1    SAB TAB Ectopic Multiple Live Births                   Home Medications    Prior to Admission medications   Medication Sig Start Date End Date Taking? Authorizing Provider  aspirin 325 MG tablet Take 325-650 mg by mouth every 4 (four) hours as needed for mild pain, moderate pain, fever or headache.   Yes [provider]  CANNABIDIOL PO Take 500 mg by mouth every other day.   Yes [provider]  cyclobenzaprine (FLEXERIL) 5 MG tablet Take 2 tablets (10 mg total) by mouth 3 (three) times daily as needed for muscle spasms. 10/31/15  Yes Virgel Manifold, MD  ibuprofen (ADVIL,MOTRIN) 800 MG tablet Take 400-800 mg by mouth every 8 (eight) hours as needed for moderate pain.    Yes [provider]  Multiple Vitamin (MULTIVITAMIN WITH MINERALS) TABS tablet Take 1 tablet by mouth daily.   Yes [provider]  omeprazole (PRILOSEC) 40 MG capsule Take 40 mg by mouth daily.   Yes [provider]  megestrol (MEGACE) 40 MG tablet Take 1 tablet (40 mg total) by mouth  2 (two) times daily. Patient not taking: Reported on 02/04/2016 06/10/15   Seabron Spates, CNM  metroNIDAZOLE (FLAGYL) 500 MG tablet Take 1 tablet (500 mg total) by mouth 2 (two) times daily. Patient not taking: Reported on 02/02/2017 12/29/16   Isla Pence, MD  naproxen (NAPROSYN) 500 MG tablet Take 1 tablet (500 mg total) by mouth 2 (two) times daily. Patient not taking: Reported on 12/29/2016 10/09/16   Dorie Rank, MD  ondansetron (ZOFRAN ODT) 4 MG disintegrating tablet Take 1 tablet (4 mg total) by mouth every 8 (eight) hours as needed for nausea or vomiting. 02/02/17   Gareth Morgan, MD  promethazine (PHENERGAN) 25 MG tablet Take 1 tablet (25 mg total) by mouth every 6 (six) hours as needed for nausea or vomiting. Patient not taking: Reported on  02/02/2017 12/29/16   Isla Pence, MD    Family History Family History  Problem Relation Age of Onset  . Breast cancer Maternal Aunt   . Heart disease Maternal Aunt   . Hypertension Mother   . Renal Disease Mother   . Colon cancer Maternal Uncle   . Pancreatic cancer Maternal Grandmother   . Cancer Maternal Grandmother        pancreas  . Diabetes Maternal Uncle     Social History Social History   Tobacco Use  . Smoking status: Never Smoker  . Smokeless tobacco: Never Used  Substance Use Topics  . Alcohol use: Yes    Comment: occasionally  . Drug use: No     Allergies   Shellfish allergy; Avocado; and Flagyl [metronidazole hcl]   Review of Systems Review of Systems  Constitutional: Positive for fatigue. Negative for fever.  HENT: Negative for sore throat.   Eyes: Negative for visual disturbance.  Respiratory: Negative for cough and shortness of breath.   Cardiovascular: Negative for chest pain.  Gastrointestinal: Positive for constipation and nausea. Negative for abdominal pain, diarrhea and vomiting.  Genitourinary: Negative for difficulty urinating and dysuria.  Musculoskeletal: Positive for arthralgias. Negative for back pain and neck pain.  Skin: Negative for rash.  Neurological: Positive for light-headedness. Negative for syncope and headaches.     Physical Exam Updated Vital Signs BP 135/85 (BP Location: Left Arm)   Pulse 72   Temp 98.2 F (36.8 C) (Oral)   Resp 17   LMP 06/10/2015 Comment: irreg bleeding, last was on 4/3  SpO2 100%   Physical Exam  Constitutional: She is oriented to person, place, and time. She appears well-developed and well-nourished. No distress.  HENT:  Head: Normocephalic and atraumatic.  Eyes: Conjunctivae and EOM are normal.  Neck: Normal range of motion.  Cardiovascular: Normal rate, regular rhythm, normal heart sounds and intact distal pulses. Exam reveals no gallop and no friction rub.  No murmur  heard. Pulmonary/Chest: Effort normal and breath sounds normal. No respiratory distress. She has no wheezes. She has no rales.  Abdominal: Soft. She exhibits no distension. There is no tenderness. There is no guarding.  Musculoskeletal: She exhibits no edema or tenderness.  Neurological: She is alert and oriented to person, place, and time.  Skin: Skin is warm and dry. No rash noted. She is not diaphoretic. No erythema.  Nursing note and vitals reviewed.    ED Treatments / Results  Labs (all labs ordered are listed, but only abnormal results are displayed) Labs Reviewed  CBC - Abnormal; Notable for the following components:      Result Value   Hemoglobin 11.5 (*)  All other components within normal limits  BASIC METABOLIC PANEL  D-DIMER, QUANTITATIVE (NOT AT [facility])  I-STAT TROPONIN, ED  I-STAT BETA HCG BLOOD, ED ([facility], [facility], [facility] ONLY)  I-STAT TROPONIN, ED    EKG  EKG Interpretation  Date/Time:  "Sunday February 02 2017 12:54:54 EST Ventricular Rate:  71 PR Interval:    QRS Duration: 80 QT Interval:  400 QTC Calculation: 435 R Axis:   92 Text Interpretation:  Sinus rhythm Borderline right axis deviation No significant change since last tracing Confirmed by Felicia Bloomquist ([id number]) on 02/02/2017 2:28:13 PM       Radiology Dg Chest 2 View  Result Date: 02/02/2017 CLINICAL DATA:  Chest pain for 2 weeks. Shortness of breath. Nausea. EXAM: CHEST  2 VIEW COMPARISON:  07/22/2016 FINDINGS: The heart size and mediastinal contours are within normal limits. Both lungs are clear. The visualized skeletal structures are unremarkable. IMPRESSION: Normal study. Electronically Signed   By: John  Stahl M.D.   On: 02/02/2017 13:30    Procedures Procedures (including critical care time)  Medications Ordered in ED Medications  sodium chloride 0.9 % bolus 1,000 mL (0 mLs Intravenous Stopped 02/02/17 1601)     Initial Impression / Assessment and Plan / ED Course  I have reviewed the triage  vital signs and the nursing notes.  Pertinent labs & imaging results that were available during my care of the patient were reviewed by me and considered in my medical decision making (see chart for details).     50 year old female with history of ovarian cystic mass for which she is undergoing evaluation with concern for malignancy at [facility], presents with concern for lightheadedness nausea, followed by episode of chest pain in route to the hospital.  EKG shows no acute findings, no sign of pericarditis. Question biphasic T but does not appear consistent with Wellen's.  Troponin negative x2. Patient is low risk HEART score. She is low risk Wells with a negative DDimer.   No significant anemia or electrolyte abnormalities. Possible dehydration, given IV fluids. Recommend close follow up with PCP, continued follow up with [facility] as scheduled. Given rx for zofran. Patient discharged in stable condition with understanding of reasons to return.   Final Clinical Impressions(s) / ED Diagnoses   Final diagnoses:  Chest pain, unspecified type  Lightheadedness  Nausea    ED Discharge Orders        Ordered    ondansetron (ZOFRAN ODT) 4 MG disintegrating tablet  Every 8 hours PRN     12" /02/18 1554       Gareth Morgan, MD 02/02/17 1703

## 2017-02-02 NOTE — ED Notes (Signed)
Pt did not answer when being called for triage but will call again .

## 2017-02-02 NOTE — ED Notes (Signed)
ED Provider at bedside. 

## 2017-02-02 NOTE — ED Triage Notes (Signed)
Pt reports she has had R upper anterior CP for the past 2 weeks. This am she began to have L side CP, SOB, lightheadedness and nausea.

## 2017-04-15 LAB — HM COLONOSCOPY

## 2017-07-27 ENCOUNTER — Emergency Department (HOSPITAL_COMMUNITY)
Admission: EM | Admit: 2017-07-27 | Discharge: 2017-07-27 | Disposition: A | Discharge status: Home / Self Care | Payer: Non-veteran care | Attending: Emergency Medicine | Admitting: Emergency Medicine

## 2017-07-27 ENCOUNTER — Encounter (HOSPITAL_COMMUNITY): Payer: Self-pay | Admitting: Emergency Medicine

## 2017-07-27 ENCOUNTER — Emergency Department (HOSPITAL_COMMUNITY): Payer: Non-veteran care

## 2017-07-27 DIAGNOSIS — R0789 Other chest pain: Secondary | ICD-10-CM | POA: Insufficient documentation

## 2017-07-27 DIAGNOSIS — R002 Palpitations: Secondary | ICD-10-CM | POA: Insufficient documentation

## 2017-07-27 DIAGNOSIS — Z5321 Procedure and treatment not carried out due to patient leaving prior to being seen by health care provider: Secondary | ICD-10-CM | POA: Diagnosis not present

## 2017-07-27 LAB — BASIC METABOLIC PANEL
ANION GAP: 9 (ref 5–15)
BUN: 15 mg/dL (ref 6–20)
CO2: 27 mmol/L (ref 22–32)
Calcium: 10.6 mg/dL — ABNORMAL HIGH (ref 8.9–10.3)
Chloride: 105 mmol/L (ref 101–111)
Creatinine, Ser: 1 mg/dL (ref 0.44–1.00)
GFR calc Af Amer: >60 mL/min (ref >60)
Glucose, Bld: 101 mg/dL — ABNORMAL HIGH (ref 65–99)
POTASSIUM: 4 mmol/L (ref 3.5–5.1)
Sodium: 141 mmol/L (ref 135–145)

## 2017-07-27 LAB — CBC
HEMATOCRIT: 36.1 % (ref 36.0–46.0)
Hemoglobin: 11.3 g/dL — ABNORMAL LOW (ref 12.0–15.0)
MCH: 26.4 pg (ref 26.0–34.0)
MCHC: 31.3 g/dL (ref 30.0–36.0)
MCV: 84.3 fL (ref 78.0–100.0)
Platelets: 277 10*3/uL (ref 150–400)
RBC: 4.28 MIL/uL (ref 3.87–5.11)
RDW: 14.4 % (ref 11.5–15.5)
WBC: 6.6 10*3/uL (ref 4.0–10.5)

## 2017-07-27 LAB — I-STAT TROPONIN, ED: Troponin i, poc: 0 ng/mL (ref 0.00–0.08)

## 2017-07-27 NOTE — ED Triage Notes (Signed)
Pt reports that she worked out and then sat down. Reports that her heart had palpitations then left upper chest pains.

## 2017-07-29 NOTE — ED Notes (Signed)
Left to go home to lay down and take ibu due to back pain  Cp better   1145  07/29/17  s Joh Rao rn

## 2017-08-07 ENCOUNTER — Encounter (HOSPITAL_COMMUNITY): Payer: Self-pay | Admitting: Emergency Medicine

## 2017-08-07 ENCOUNTER — Emergency Department (HOSPITAL_COMMUNITY)
Admission: EM | Admit: 2017-08-07 | Discharge: 2017-08-07 | Disposition: A | Discharge status: Home / Self Care | Payer: Non-veteran care | Attending: Emergency Medicine | Admitting: Emergency Medicine

## 2017-08-07 ENCOUNTER — Other Ambulatory Visit: Payer: Self-pay

## 2017-08-07 DIAGNOSIS — Y939 Activity, unspecified: Secondary | ICD-10-CM | POA: Insufficient documentation

## 2017-08-07 DIAGNOSIS — Z7982 Long term (current) use of aspirin: Secondary | ICD-10-CM | POA: Insufficient documentation

## 2017-08-07 DIAGNOSIS — S01302A Unspecified open wound of left ear, initial encounter: Secondary | ICD-10-CM

## 2017-08-07 DIAGNOSIS — Y999 Unspecified external cause status: Secondary | ICD-10-CM | POA: Insufficient documentation

## 2017-08-07 DIAGNOSIS — H9202 Otalgia, left ear: Secondary | ICD-10-CM | POA: Diagnosis present

## 2017-08-07 DIAGNOSIS — Y929 Unspecified place or not applicable: Secondary | ICD-10-CM | POA: Diagnosis not present

## 2017-08-07 DIAGNOSIS — X58XXXA Exposure to other specified factors, initial encounter: Secondary | ICD-10-CM | POA: Diagnosis not present

## 2017-08-07 DIAGNOSIS — Z79899 Other long term (current) drug therapy: Secondary | ICD-10-CM | POA: Diagnosis not present

## 2017-08-07 HISTORY — DX: Unspecified osteoarthritis, unspecified site: M19.90

## 2017-08-07 MED ORDER — CIPROFLOXACIN-DEXAMETHASONE 0.3-0.1 % OT SUSP: 4.0000 [drp] | Freq: Two times a day (BID) | OTIC | 0 refills | Status: DC

## 2017-08-07 NOTE — ED Provider Notes (Signed)
[place] DEPT Provider Note   CSN: [id number] Arrival date & time: 08/07/17  1955     History   Chief Complaint No chief complaint on file.   HPI Theresa Peterson is a 52 y.o. female.  The history is provided by the patient and medical records.    52 year old female with history of anemia, anxiety, arthritis, fibroids, GERD, hemorrhoids, ovarian, presenting to the ED for left ear pain.  Reports she was sick with the flu in March and ever since then she has had intermittent episodes of feeling that there is "fluid" in her ear.  During these times she will have muffled hearing.  Currently her hearing is normal, but she awoke this morning and had some pain inside her left ear canal and she stuck her finger in there and noticed that there was some blood.  She denies any fever or chills.  Past Medical History:  Diagnosis Date  . Anemia   . Anemia   . Anxiety   . Arthritis   . Fibroids   . GERD (gastroesophageal reflux disease)   . Heart murmur   . Hemorrhoids   . Obesity   . Ovarian cyst     Patient Active Problem List   Diagnosis Date Noted  . Symptomatic anemia 02/26/2015  . Vaginal bleeding 02/26/2015  . Atypical chest pain 02/08/2013  . Chest pain 12/10/2010  . W 12/10/2010  . Palpitations 12/10/2010  . GERD (gastroesophageal reflux disease) 10/18/2010    Past Surgical History:  Procedure Laterality Date  . ABDOMINAL HYSTERECTOMY       OB History    Gravida  1   Para  1   Term  1   Preterm      AB      Living  1     SAB      TAB      Ectopic      Multiple      Live Births               Home Medications    Prior to Admission medications   Medication Sig Start Date End Date Taking? Authorizing Provider  aspirin 325 MG tablet Take 325-650 mg by mouth every 4 (four) hours as needed for mild pain, moderate pain, fever or headache.    [provider]  CANNABIDIOL PO Take 500 mg by mouth every  other day.    [provider]  cyclobenzaprine (FLEXERIL) 5 MG tablet Take 2 tablets (10 mg total) by mouth 3 (three) times daily as needed for muscle spasms. 10/31/15   Virgel Manifold, MD  ibuprofen (ADVIL,MOTRIN) 800 MG tablet Take 400-800 mg by mouth every 8 (eight) hours as needed for moderate pain.     [provider]  megestrol (MEGACE) 40 MG tablet Take 1 tablet (40 mg total) by mouth 2 (two) times daily. Patient not taking: Reported on 02/04/2016 06/10/15   Seabron Spates, CNM  metroNIDAZOLE (FLAGYL) 500 MG tablet Take 1 tablet (500 mg total) by mouth 2 (two) times daily. Patient not taking: Reported on 02/02/2017 12/29/16   Isla Pence, MD  Multiple Vitamin (MULTIVITAMIN WITH MINERALS) TABS tablet Take 1 tablet by mouth daily.    [provider]  naproxen (NAPROSYN) 500 MG tablet Take 1 tablet (500 mg total) by mouth 2 (two) times daily. Patient not taking: Reported on 12/29/2016 10/09/16   Dorie Rank, MD  omeprazole (PRILOSEC) 40 MG capsule Take 40 mg by mouth  daily.    [provider]  ondansetron (ZOFRAN ODT) 4 MG disintegrating tablet Take 1 tablet (4 mg total) by mouth every 8 (eight) hours as needed for nausea or vomiting. 02/02/17   Gareth Morgan, MD  promethazine (PHENERGAN) 25 MG tablet Take 1 tablet (25 mg total) by mouth every 6 (six) hours as needed for nausea or vomiting. Patient not taking: Reported on 02/02/2017 12/29/16   Isla Pence, MD    Family History Family History  Problem Relation Age of Onset  . Breast cancer Maternal Aunt   . Heart disease Maternal Aunt   . Hypertension Mother   . Renal Disease Mother   . Colon cancer Maternal Uncle   . Pancreatic cancer Maternal Grandmother   . Cancer Maternal Grandmother        pancreas  . Diabetes Maternal Uncle     Social History Social History   Tobacco Use  . Smoking status: Never Smoker  . Smokeless tobacco: Never Used  Substance Use Topics  . Alcohol use: Yes     Comment: occasionally  . Drug use: No     Allergies   Shellfish allergy; Avocado; and Flagyl [metronidazole hcl]   Review of Systems Review of Systems  HENT: Positive for ear pain.   All other systems reviewed and are negative.    Physical Exam Updated Vital Signs BP 129/87   Pulse 83   Temp 98.4 F (36.9 C)   Resp 18   Ht 5\' 4"  (1.626 m)   Wt 121.1 kg (267 lb)   LMP 06/10/2015 Comment: irreg bleeding, last was on 4/3  SpO2 98%   BMI 45.83 kg/m   Physical Exam  Constitutional: She is oriented to person, place, and time. She appears well-developed and well-nourished.  HENT:  Head: Normocephalic and atraumatic.  Mouth/Throat: Oropharynx is clear and moist.  Both TM's normal in appearance without signs of effusion or perforation; left ear does have some pimple appearing structure just inside the canal, overlying abrasion with dried blood, no active bleeding  Eyes: Pupils are equal, round, and reactive to light. Conjunctivae and EOM are normal.  Neck: Normal range of motion.  Cardiovascular: Normal rate, regular rhythm and normal heart sounds.  Pulmonary/Chest: Effort normal and breath sounds normal. No stridor. No respiratory distress.  Abdominal: Soft. Bowel sounds are normal. There is no tenderness. There is no rebound.  Musculoskeletal: Normal range of motion.  Neurological: She is alert and oriented to person, place, and time.  Skin: Skin is warm and dry.  Psychiatric: She has a normal mood and affect.  Nursing note and vitals reviewed.    ED Treatments / Results  Labs (all labs ordered are listed, but only abnormal results are displayed) Labs Reviewed - No data to display  EKG None  Radiology No results found.  Procedures Procedures (including critical care time)  Medications Ordered in ED Medications - No data to display   Initial Impression / Assessment and Plan / ED Course  I have reviewed the triage vital signs and the nursing  notes.  Pertinent labs & imaging results that were available during my care of the patient were reviewed by me and considered in my medical decision making (see chart for details).  52 y.o. F here with pain/bleeding from left ear canal.  TM's are normal on exam, no signs of perforation. She does appear to have a small pimple inside the left ear canal with dried blood-- no active bleeding.  Will start on ciprodex  drops and have her follow-up closely with her PCP.  Discussed plan with patient, she acknowledged understanding and agreed with plan of care.  Return precautions given for new or worsening symptoms.  Final Clinical Impressions(s) / ED Diagnoses   Final diagnoses:  Wound of left auditory canal, initial encounter    ED Discharge Orders        Ordered    ciprofloxacin-dexamethasone (CIPRODEX) OTIC suspension  2 times daily     08/07/17 2326       Larene Pickett, PA-C 08/07/17 2347    Molpus, Jenny Reichmann, MD 08/08/17 2244

## 2017-08-07 NOTE — ED Triage Notes (Signed)
Pt from home with c/o bleeding in right ear following having the flu in March. Pt states during her illness she reports feeling as if her ear was filling up with water and then leaking out. Pt then reports ringing in her ears. Pt states recently she has had slight bleeding from same ear. Pt denies using q-tips

## 2017-08-07 NOTE — Discharge Instructions (Signed)
Use the ear drops as directed. Follow-up with your primary care doctor. Return here for any new/acute changes.

## 2017-11-22 ENCOUNTER — Emergency Department (HOSPITAL_COMMUNITY)
Admission: EM | Admit: 2017-11-22 | Discharge: 2017-11-22 | Disposition: A | Discharge status: Home / Self Care | Payer: Non-veteran care | Attending: Emergency Medicine | Admitting: Emergency Medicine

## 2017-11-22 ENCOUNTER — Encounter (HOSPITAL_COMMUNITY): Payer: Self-pay | Admitting: *Deleted

## 2017-11-22 ENCOUNTER — Emergency Department (HOSPITAL_COMMUNITY): Payer: Non-veteran care

## 2017-11-22 DIAGNOSIS — R200 Anesthesia of skin: Secondary | ICD-10-CM | POA: Insufficient documentation

## 2017-11-22 DIAGNOSIS — Z7982 Long term (current) use of aspirin: Secondary | ICD-10-CM | POA: Insufficient documentation

## 2017-11-22 DIAGNOSIS — Z79899 Other long term (current) drug therapy: Secondary | ICD-10-CM | POA: Insufficient documentation

## 2017-11-22 DIAGNOSIS — R202 Paresthesia of skin: Secondary | ICD-10-CM | POA: Insufficient documentation

## 2017-11-22 LAB — COMPREHENSIVE METABOLIC PANEL
ALT: 14 U/L (ref 0–44)
AST: 20 U/L (ref 15–41)
Albumin: 4 g/dL (ref 3.5–5.0)
Alkaline Phosphatase: 95 U/L (ref 38–126)
Anion gap: 8 (ref 5–15)
BUN: 14 mg/dL (ref 6–20)
CHLORIDE: 108 mmol/L (ref 98–111)
CO2: 27 mmol/L (ref 22–32)
Calcium: 10.1 mg/dL (ref 8.9–10.3)
Creatinine, Ser: 0.8 mg/dL (ref 0.44–1.00)
GFR calc non Af Amer: >60 mL/min (ref >60)
Glucose, Bld: 102 mg/dL — ABNORMAL HIGH (ref 70–99)
Potassium: 4.2 mmol/L (ref 3.5–5.1)
SODIUM: 143 mmol/L (ref 135–145)
Total Bilirubin: 0.2 mg/dL — ABNORMAL LOW (ref 0.3–1.2)
Total Protein: 7.6 g/dL (ref 6.5–8.1)

## 2017-11-22 LAB — CBC WITH DIFFERENTIAL/PLATELET
BASOS ABS: 0 10*3/uL (ref 0.0–0.1)
BASOS PCT: 0 %
EOS ABS: 0.1 10*3/uL (ref 0.0–0.7)
EOS PCT: 2 %
HCT: 37.9 % (ref 36.0–46.0)
Hemoglobin: 12 g/dL (ref 12.0–15.0)
Lymphocytes Relative: 31 %
Lymphs Abs: 1.7 10*3/uL (ref 0.7–4.0)
MCH: 26.5 pg (ref 26.0–34.0)
MCHC: 31.7 g/dL (ref 30.0–36.0)
MCV: 83.8 fL (ref 78.0–100.0)
Monocytes Absolute: 0.3 10*3/uL (ref 0.1–1.0)
Monocytes Relative: 5 %
NEUTROS ABS: 3.4 10*3/uL (ref 1.7–7.7)
NEUTROS PCT: 62 %
PLATELETS: 277 10*3/uL (ref 150–400)
RBC: 4.52 MIL/uL (ref 3.87–5.11)
RDW: 14.4 % (ref 11.5–15.5)
WBC: 5.5 10*3/uL (ref 4.0–10.5)

## 2017-11-22 NOTE — ED Provider Notes (Signed)
[facility] DEPT Provider Note   CSN: [id number] Arrival date & time: 11/22/17  6720     History   Chief Complaint Chief Complaint  Patient presents with  . Dizziness  . Numbness    right facial    HPI Theresa [facility] is a 52 y.o. female.  Patient complains of some clogged ears and also numbness to her face.  She has been treated recently for otitis  The history is provided by the patient. No language interpreter was used.  Illness  This is a new problem. The current episode started 12 to 24 hours ago. The problem occurs constantly. The problem has not changed since onset.Pertinent negatives include no chest pain, no abdominal pain and no headaches. Nothing aggravates the symptoms. Nothing relieves the symptoms. She has tried nothing for the symptoms. The treatment provided no relief.    Past Medical History:  Diagnosis Date  . Anemia   . Anemia   . Anxiety   . Arthritis   . Fibroids   . GERD (gastroesophageal reflux disease)   . Heart murmur   . Hemorrhoids   . Obesity   . Ovarian cyst     Patient Active Problem List   Diagnosis Date Noted  . Symptomatic anemia 02/26/2015  . Vaginal bleeding 02/26/2015  . Atypical chest pain 02/08/2013  . Chest pain 12/10/2010  . W 12/10/2010  . Palpitations 12/10/2010  . GERD (gastroesophageal reflux disease) 10/18/2010    Past Surgical History:  Procedure Laterality Date  . ABDOMINAL HYSTERECTOMY       OB History    Gravida  1   Para  1   Term  1   Preterm      AB      Living  1     SAB      TAB      Ectopic      Multiple      Live Births               Home Medications    Prior to Admission medications   Medication Sig Start Date End Date Taking? Authorizing Provider  aspirin 81 MG chewable tablet Chew 81 mg by mouth daily.   Yes [provider]  CANNABIDIOL PO Take 500 mg by mouth every other day.   Yes [provider]  cyclobenzaprine  (FLEXERIL) 5 MG tablet Take 2 tablets (10 mg total) by mouth 3 (three) times daily as needed for muscle spasms. 10/31/15  Yes Virgel Manifold, MD  ibuprofen (ADVIL,MOTRIN) 800 MG tablet Take 400-800 mg by mouth every 8 (eight) hours as needed for moderate pain.    Yes [provider]  Multiple Vitamin (MULTIVITAMIN WITH MINERALS) TABS tablet Take 1 tablet by mouth daily.   Yes [provider]  omeprazole (PRILOSEC) 40 MG capsule Take 40 mg by mouth daily.   Yes [provider]  ciprofloxacin-dexamethasone (CIPRODEX) OTIC suspension Place 4 drops into the left ear 2 (two) times daily. Patient not taking: Reported on 11/22/2017 08/07/17   Larene Pickett, PA-C  ondansetron (ZOFRAN ODT) 4 MG disintegrating tablet Take 1 tablet (4 mg total) by mouth every 8 (eight) hours as needed for nausea or vomiting. Patient not taking: Reported on 11/22/2017 02/02/17   Gareth Morgan, MD    Family History Family History  Problem Relation Age of Onset  . Breast cancer Maternal Aunt   . Heart disease Maternal Aunt   . Hypertension Mother   .  Renal Disease Mother   . Colon cancer Maternal Uncle   . Pancreatic cancer Maternal Grandmother   . Cancer Maternal Grandmother        pancreas  . Diabetes Maternal Uncle     Social History Social History   Tobacco Use  . Smoking status: Never Smoker  . Smokeless tobacco: Never Used  Substance Use Topics  . Alcohol use: Yes    Comment: occasionally  . Drug use: No     Allergies   Shellfish allergy; Avocado; and Flagyl [metronidazole hcl]   Review of Systems Review of Systems  Constitutional: Negative for appetite change and fatigue.  HENT: Negative for congestion, ear discharge and sinus pressure.        Fullness in the ears  Eyes: Negative for discharge.  Respiratory: Negative for cough.   Cardiovascular: Negative for chest pain.  Gastrointestinal: Negative for abdominal pain and diarrhea.  Genitourinary: Negative for  frequency and hematuria.  Musculoskeletal: Negative for back pain.  Skin: Negative for rash.  Neurological: Negative for seizures and headaches.       Facial numbness  Psychiatric/Behavioral: Negative for hallucinations.     Physical Exam Updated Vital Signs BP 116/75   Pulse (!) 58   Temp (!) 97.5 F (36.4 C) (Oral)   Resp 16   LMP 06/10/2015 Comment: irreg bleeding, last was on 4/3  SpO2 100%   Physical Exam  Constitutional: She is oriented to person, place, and time. She appears well-developed.  HENT:  Head: Normocephalic.  Eyes: Conjunctivae and EOM are normal. No scleral icterus.  Neck: Neck supple. No thyromegaly present.  Cardiovascular: Normal rate and regular rhythm. Exam reveals no gallop and no friction rub.  No murmur heard. Pulmonary/Chest: No stridor. She has no wheezes. She has no rales. She exhibits no tenderness.  Abdominal: She exhibits no distension. There is no tenderness. There is no rebound.  Musculoskeletal: Normal range of motion. She exhibits no edema.  Lymphadenopathy:    She has no cervical adenopathy.  Neurological: She is oriented to person, place, and time. She exhibits normal muscle tone. Coordination normal.  Mild decreased sensation to right cheek  Skin: No rash noted. No erythema.  Psychiatric: She has a normal mood and affect. Her behavior is normal.     ED Treatments / Results  Labs (all labs ordered are listed, but only abnormal results are displayed) Labs Reviewed  COMPREHENSIVE METABOLIC PANEL - Abnormal; Notable for the following components:      Result Value   Glucose, Bld 102 (*)    Total Bilirubin 0.2 (*)    All other components within normal limits  CBC WITH DIFFERENTIAL/PLATELET    EKG None  Radiology Ct Head Wo Contrast  Result Date: 11/22/2017 CLINICAL DATA:  Altered level of consciousness, facial paresthesias EXAM: CT HEAD WITHOUT CONTRAST TECHNIQUE: Contiguous axial images were obtained from the base of the  skull through the vertex without intravenous contrast. COMPARISON:  Left 11/22/2010 FINDINGS: Brain: No evidence of acute infarction, hemorrhage, hydrocephalus, extra-axial collection or mass lesion/mass effect. Vascular: No hyperdense vessel or unexpected calcification. Skull: Normal. Negative for fracture or focal lesion. Sinuses/Orbits: Orbits unremarkable and symmetric. Visualized sinuses and mastoids are clear. Other: None. IMPRESSION: Normal head CT without contrast for age Electronically Signed   By: Jerilynn Mages.  Shick M.D.   On: 11/22/2017 10:09    Procedures Procedures (including critical care time)  Medications Ordered in ED Medications - No data to display   Initial Impression / Assessment and Plan /  ED Course  I have reviewed the triage vital signs and the nursing notes.  Pertinent labs & imaging results that were available during my care of the patient were reviewed by me and considered in my medical decision making (see chart for details).     Labs unremarkable.  CT scan normal.  Patient will be referred to ENT for her ear fullness and her facial numbness she will follow-up with her PCP.  Suspect just peripheral neuritis  Final Clinical Impressions(s) / ED Diagnoses   Final diagnoses:  Paresthesia    ED Discharge Orders    None       Milton Ferguson, MD 11/22/17 1153

## 2017-11-22 NOTE — Discharge Instructions (Addendum)
Follow-up with Dr. Erik Obey for your ear problems.  Otherwise follow-up with your doctor if any other problems

## 2017-11-22 NOTE — ED Notes (Signed)
ED Provider at bedside. 

## 2017-11-22 NOTE — ED Triage Notes (Signed)
Pt complains of dizziness and chills for the past week. Pt states the right side of her face began feeling numb yesterday. Pt states she has been having congestion in her ears over the past few months. No facial droop, arm drift or slurred speech noted.

## 2018-02-26 ENCOUNTER — Encounter (HOSPITAL_COMMUNITY): Payer: Self-pay | Admitting: Emergency Medicine

## 2018-02-26 ENCOUNTER — Other Ambulatory Visit: Payer: Self-pay

## 2018-02-26 ENCOUNTER — Emergency Department (HOSPITAL_COMMUNITY)
Admission: EM | Admit: 2018-02-26 | Discharge: 2018-02-26 | Disposition: A | Discharge status: Home / Self Care | Payer: Non-veteran care | Attending: Emergency Medicine | Admitting: Emergency Medicine

## 2018-02-26 ENCOUNTER — Emergency Department (HOSPITAL_COMMUNITY): Payer: Non-veteran care

## 2018-02-26 DIAGNOSIS — Z79899 Other long term (current) drug therapy: Secondary | ICD-10-CM | POA: Insufficient documentation

## 2018-02-26 DIAGNOSIS — M25512 Pain in left shoulder: Secondary | ICD-10-CM | POA: Insufficient documentation

## 2018-02-26 DIAGNOSIS — Z7982 Long term (current) use of aspirin: Secondary | ICD-10-CM | POA: Insufficient documentation

## 2018-02-26 HISTORY — DX: Retroperitoneal fibrosis: K68.2

## 2018-02-26 HISTORY — DX: Crossing vessel and stricture of ureter without hydronephrosis: N13.5

## 2018-02-26 MED ORDER — DICLOFENAC SODIUM 50 MG PO TBEC: 50.0000 mg | DELAYED_RELEASE_TABLET | Freq: Two times a day (BID) | ORAL | 0 refills | Status: DC

## 2018-02-26 MED ORDER — IBUPROFEN 200 MG PO TABS
600.0000 mg | ORAL_TABLET | Freq: Once | ORAL | Status: AC
  Administered 2018-02-26: 600 mg via ORAL
  Filled 2018-02-26: qty 3

## 2018-02-26 NOTE — Discharge Instructions (Addendum)
Take the medication as directed and follow up with your primary care doctor. Return here as needed.

## 2018-02-26 NOTE — ED Provider Notes (Signed)
[facility] DEPT Provider Note   CSN: [id number] Arrival date & time: 02/26/18  1126     History   Chief Complaint Chief Complaint  Patient presents with  . Shoulder Pain    HPI Theresa Peterson is a 52 y.o. female who presents to the ED with c/o left shoulder pain that started 4 days ago. Patient reports hx of shoulder pain but reports it has gotten worse in the last few days. Patient states she has been moving and had to lift and do strenuous activity for the past 2 weeks. Patient reports the pain increases with any movement of the shoulder.    HPI  Past Medical History:  Diagnosis Date  . Anemia   . Anemia   . Anxiety   . Arthritis   . Fibroids   . GERD (gastroesophageal reflux disease)   . Heart murmur   . Hemorrhoids   . Obesity   . Ovarian cyst   . Retroperitoneal fibrosis     Patient Active Problem List   Diagnosis Date Noted  . Symptomatic anemia 02/26/2015  . Vaginal bleeding 02/26/2015  . Atypical chest pain 02/08/2013  . Chest pain 12/10/2010  . W 12/10/2010  . Palpitations 12/10/2010  . GERD (gastroesophageal reflux disease) 10/18/2010    Past Surgical History:  Procedure Laterality Date  . ABDOMINAL HYSTERECTOMY       OB History    Gravida  1   Para  1   Term  1   Preterm      AB      Living  1     SAB      TAB      Ectopic      Multiple      Live Births               Home Medications    Prior to Admission medications   Medication Sig Start Date End Date Taking? Authorizing Provider  aspirin 81 MG chewable tablet Chew 81 mg by mouth daily.    [provider]  CANNABIDIOL PO Take 500 mg by mouth every other day.    [provider]  cyclobenzaprine (FLEXERIL) 5 MG tablet Take 2 tablets (10 mg total) by mouth 3 (three) times daily as needed for muscle spasms. 10/31/15   Virgel Manifold, MD  diclofenac (VOLTAREN) 50 MG EC tablet Take 1 tablet (50 mg total) by mouth 2  (two) times daily. 02/26/18   Ashley Murrain, NP  Multiple Vitamin (MULTIVITAMIN WITH MINERALS) TABS tablet Take 1 tablet by mouth daily.    [provider]  omeprazole (PRILOSEC) 40 MG capsule Take 40 mg by mouth daily.    [provider]    Family History Family History  Problem Relation Age of Onset  . Breast cancer Maternal Aunt   . Heart disease Maternal Aunt   . Hypertension Mother   . Renal Disease Mother   . Colon cancer Maternal Uncle   . Pancreatic cancer Maternal Grandmother   . Cancer Maternal Grandmother        pancreas  . Diabetes Maternal Uncle     Social History Social History   Tobacco Use  . Smoking status: Never Smoker  . Smokeless tobacco: Never Used  Substance Use Topics  . Alcohol use: Yes    Comment: occasionally  . Drug use: No     Allergies   Shellfish allergy; Avocado; and Flagyl [metronidazole hcl]   Review of Systems  Review of Systems  Musculoskeletal: Positive for arthralgias and joint swelling.  All other systems reviewed and are negative.    Physical Exam Updated Vital Signs BP 127/75 (BP Location: Left Arm)   Pulse 76   Temp 98.8 F (37.1 C) (Oral)   Resp 16   Ht 5\' 4"  (1.626 m)   Wt 123.4 kg   LMP 06/10/2015 Comment: irreg bleeding, last was on 4/3  SpO2 100%   BMI 46.69 kg/m   Physical Exam Vitals signs and nursing note reviewed.  Constitutional:      General: She is not in acute distress.    Appearance: She is well-developed.  HENT:     Head: Normocephalic.  Eyes:     Conjunctiva/sclera: Conjunctivae normal.  Neck:     Musculoskeletal: Neck supple.  Cardiovascular:     Rate and Rhythm: Normal rate and regular rhythm.  Pulmonary:     Effort: Pulmonary effort is normal.     Breath sounds: Normal breath sounds. No wheezing or rales.  Musculoskeletal:     Left shoulder: She exhibits tenderness and spasm. She exhibits no crepitus, no deformity, normal pulse and normal strength. Decreased range of  motion: due to pain.     Comments: Tender on palpation of anterior left shoulder and with range of motion. Increased pain with putting left arm behind back or raising arm. Radial pulses 2+, adequate circulation.   Skin:    General: Skin is warm and dry.  Neurological:     Mental Status: She is alert and oriented to person, place, and time.     Cranial Nerves: No cranial nerve deficit.      ED Treatments / Results  Labs (all labs ordered are listed, but only abnormal results are displayed) Labs Reviewed - No data to display  Radiology Dg Shoulder Left  Result Date: 02/26/2018 CLINICAL DATA:  Left shoulder pain for 4 days. EXAM: LEFT SHOULDER - 2+ VIEW COMPARISON:  None. FINDINGS: The joint spaces are maintained. No acute bony findings or degenerative changes. No abnormal soft tissue calcifications. The visualized left ribs are intact and the visualized left lung is clear. IMPRESSION: No acute bony findings or degenerative changes. Electronically Signed   By: Marijo Sanes M.D.   On: 02/26/2018 12:56    Procedures Procedures (including critical care time)  Medications Ordered in ED Medications  ibuprofen (ADVIL,MOTRIN) tablet 600 mg (600 mg Oral Given 02/26/18 1258)     Initial Impression / Assessment and Plan / ED Course  I have reviewed the triage vital signs and the nursing notes. 52 y.o. female with left shoulder pain s/p moving and lifting stable for d/c without fracture, separation or dislocation noted on x-ray. Discussed with the patient f/u with PCP and medication for pain. Patient agrees with plan.   Final Clinical Impressions(s) / ED Diagnoses   Final diagnoses:  Acute pain of left shoulder    ED Discharge Orders         Ordered    diclofenac (VOLTAREN) 50 MG EC tablet  2 times daily     02/26/18 [address] [place], [place] 02/26/18 1318    Mesner, Corene Cornea, MD 02/27/18 [phone]

## 2018-02-26 NOTE — ED Notes (Signed)
Bed: WTR8 Expected date:  Expected time:  Means of arrival:  Comments: 

## 2018-02-26 NOTE — ED Triage Notes (Signed)
Pt c/o left shoulder/chest pain x4 days ago.  Pt reports having hx of shoulder pain, but it has gotten worse in the last few days.  Pt reports moving 2 weeks ago, where she did strenuous activity.  Pt reports limited movement of left shoulder.

## 2018-03-19 ENCOUNTER — Other Ambulatory Visit: Payer: Self-pay

## 2018-03-19 ENCOUNTER — Emergency Department (HOSPITAL_COMMUNITY)
Admission: EM | Admit: 2018-03-19 | Discharge: 2018-03-19 | Disposition: A | Discharge status: Home / Self Care | Payer: No Typology Code available for payment source | Attending: Emergency Medicine | Admitting: Emergency Medicine

## 2018-03-19 ENCOUNTER — Emergency Department (HOSPITAL_COMMUNITY): Payer: No Typology Code available for payment source

## 2018-03-19 DIAGNOSIS — E860 Dehydration: Secondary | ICD-10-CM

## 2018-03-19 DIAGNOSIS — R0789 Other chest pain: Secondary | ICD-10-CM

## 2018-03-19 DIAGNOSIS — Z79899 Other long term (current) drug therapy: Secondary | ICD-10-CM | POA: Diagnosis not present

## 2018-03-19 DIAGNOSIS — Z7982 Long term (current) use of aspirin: Secondary | ICD-10-CM | POA: Diagnosis not present

## 2018-03-19 LAB — CBC
HEMATOCRIT: 37.7 % (ref 36.0–46.0)
HEMOGLOBIN: 11.3 g/dL — AB (ref 12.0–15.0)
MCH: 25.5 pg — ABNORMAL LOW (ref 26.0–34.0)
MCHC: 30 g/dL (ref 30.0–36.0)
MCV: 85.1 fL (ref 80.0–100.0)
NRBC: 0 % (ref 0.0–0.2)
Platelets: 273 10*3/uL (ref 150–400)
RBC: 4.43 MIL/uL (ref 3.87–5.11)
RDW: 14.7 % (ref 11.5–15.5)
WBC: 7.2 10*3/uL (ref 4.0–10.5)

## 2018-03-19 LAB — BASIC METABOLIC PANEL
Anion gap: 10 (ref 5–15)
BUN: 14 mg/dL (ref 6–20)
CHLORIDE: 104 mmol/L (ref 98–111)
CO2: 26 mmol/L (ref 22–32)
CREATININE: 0.84 mg/dL (ref 0.44–1.00)
Calcium: 9.8 mg/dL (ref 8.9–10.3)
GFR calc non Af Amer: >60 mL/min (ref >60)
Glucose, Bld: 91 mg/dL (ref 70–99)
POTASSIUM: 4.3 mmol/L (ref 3.5–5.1)
SODIUM: 140 mmol/L (ref 135–145)

## 2018-03-19 LAB — I-STAT TROPONIN, ED: Troponin i, poc: 0.01 ng/mL (ref 0.00–0.08)

## 2018-03-19 MED ORDER — CELECOXIB 200 MG PO CAPS: 200.0000 mg | ORAL_CAPSULE | Freq: Two times a day (BID) | ORAL | 0 refills | Status: DC

## 2018-03-19 MED ORDER — SODIUM CHLORIDE 0.9 % IV BOLUS
1000.0000 mL | Freq: Once | INTRAVENOUS | Status: AC
  Administered 2018-03-19: 1000 mL via INTRAVENOUS

## 2018-03-19 MED ORDER — KETOROLAC TROMETHAMINE 30 MG/ML IJ SOLN
30.0000 mg | Freq: Once | INTRAMUSCULAR | Status: AC
  Administered 2018-03-19: 30 mg via INTRAVENOUS
  Filled 2018-03-19: qty 1

## 2018-03-19 NOTE — ED Notes (Signed)
Pt returned from radiology at this time.  

## 2018-03-19 NOTE — ED Triage Notes (Addendum)
Pt arrives POV for chest pain and dizziness. Pt states she was started on methylprednisolone on Thursday. Pt last dose was Tuesday. Pt endorses CP and dizziness started Saturday. Pt alert, oriented, and ambulatory.

## 2018-03-19 NOTE — ED Notes (Signed)
Patient transported to X-ray 

## 2018-03-19 NOTE — ED Provider Notes (Signed)
[facility] Provider Note   CSN: [id number] Arrival date & time: 03/19/18  0865     History   Chief Complaint Chief Complaint  Patient presents with  . Chest Pain    HPI Theresa [facility] is a 53 y.o. female.  Pt presents to the ED today with CP and dizziness.  She was prescribed a medrol dose pack which she started on Thursday, 1/9.  Sx started on 1/11.  She stopped taking the medrol dose pack on 1/14 and still feels the same.  No f/c.  No sob or n/v.  On the 11th, she felt like she was going to faint.  That sensation has improved, but is still there.     Past Medical History:  Diagnosis Date  . Anemia   . Anemia   . Anxiety   . Arthritis   . Fibroids   . GERD (gastroesophageal reflux disease)   . Heart murmur   . Hemorrhoids   . Obesity   . Ovarian cyst   . Retroperitoneal fibrosis     Patient Active Problem List   Diagnosis Date Noted  . Symptomatic anemia 02/26/2015  . Vaginal bleeding 02/26/2015  . Atypical chest pain 02/08/2013  . Chest pain 12/10/2010  . W 12/10/2010  . Palpitations 12/10/2010  . GERD (gastroesophageal reflux disease) 10/18/2010    Past Surgical History:  Procedure Laterality Date  . ABDOMINAL HYSTERECTOMY       OB History    Gravida  1   Para  1   Term  1   Preterm      AB      Living  1     SAB      TAB      Ectopic      Multiple      Live Births               Home Medications    Prior to Admission medications   Medication Sig Start Date End Date Taking? Authorizing Provider  aspirin 81 MG chewable tablet Chew 81 mg by mouth daily.    [provider]  CANNABIDIOL PO Take 500 mg by mouth every other day.    [provider]  celecoxib (CELEBREX) 200 MG capsule Take 1 capsule (200 mg total) by mouth 2 (two) times daily. 03/19/18   Isla Pence, MD  cyclobenzaprine (FLEXERIL) 5 MG tablet Take 2 tablets (10 mg total) by mouth 3 (three) times  daily as needed for muscle spasms. 10/31/15   Virgel Manifold, MD  diclofenac (VOLTAREN) 50 MG EC tablet Take 1 tablet (50 mg total) by mouth 2 (two) times daily. 02/26/18   Ashley Murrain, NP  Multiple Vitamin (MULTIVITAMIN WITH MINERALS) TABS tablet Take 1 tablet by mouth daily.    [provider]  omeprazole (PRILOSEC) 40 MG capsule Take 40 mg by mouth daily.    [provider]    Family History Family History  Problem Relation Age of Onset  . Breast cancer Maternal Aunt   . Heart disease Maternal Aunt   . Hypertension Mother   . Renal Disease Mother   . Colon cancer Maternal Uncle   . Pancreatic cancer Maternal Grandmother   . Cancer Maternal Grandmother        pancreas  . Diabetes Maternal Uncle     Social History Social History   Tobacco Use  . Smoking status: Never Smoker  . Smokeless tobacco: Never Used  Substance  Use Topics  . Alcohol use: Yes    Comment: occasionally  . Drug use: No     Allergies   Shellfish allergy; Avocado; and Flagyl [metronidazole hcl]   Review of Systems Review of Systems  Cardiovascular: Positive for chest pain.  Neurological: Positive for dizziness.  All other systems reviewed and are negative.    Physical Exam Updated Vital Signs BP (!) 120/104   Pulse 66   Temp 98.2 F (36.8 C) (Oral)   Resp 14   Ht 5\' 4"  (1.626 m)   Wt 122.5 kg   LMP 06/10/2015 Comment: irreg bleeding, last was on 4/3  SpO2 100%   BMI 46.35 kg/m   Physical Exam Vitals signs and nursing note reviewed.  Constitutional:      Appearance: She is well-developed.  HENT:     Head: Normocephalic and atraumatic.  Eyes:     Extraocular Movements: Extraocular movements intact.     Pupils: Pupils are equal, round, and reactive to light.  Neck:     Musculoskeletal: Normal range of motion and neck supple.  Cardiovascular:     Rate and Rhythm: Normal rate and regular rhythm.     Heart sounds: Normal heart sounds.  Pulmonary:     Effort:  Pulmonary effort is normal.     Breath sounds: Normal breath sounds.  Chest:     Chest wall: Tenderness present.    Abdominal:     General: Bowel sounds are normal.     Palpations: Abdomen is soft.  Musculoskeletal: Normal range of motion.  Skin:    General: Skin is warm and dry.     Capillary Refill: Capillary refill takes less than 2 seconds.  Neurological:     General: No focal deficit present.     Mental Status: She is alert and oriented to person, place, and time.  Psychiatric:        Mood and Affect: Mood normal.        Behavior: Behavior normal.      ED Treatments / Results  Labs (all labs ordered are listed, but only abnormal results are displayed) Labs Reviewed  CBC - Abnormal; Notable for the following components:      Result Value   Hemoglobin 11.3 (*)    MCH 25.5 (*)    All other components within normal limits  BASIC METABOLIC PANEL  I-STAT TROPONIN, ED    EKG EKG Interpretation  Date/Time:  Thursday March 19 2018 08:51:24 EST Ventricular Rate:  67 PR Interval:    QRS Duration: 85 QT Interval:  402 QTC Calculation: 425 R Axis:   84 Text Interpretation:  Sinus rhythm Borderline repolarization abnormality Baseline wander in lead(s) II III aVR aVL aVF V1 V2 V3 V4 V5 V6 No significant change since last tracing Confirmed by Isla Pence [phone]) on 03/19/2018 8:53:47 AM Also confirmed by Isla Pence [phone]), editor Lynder Parents [phone])  on 03/19/2018 9:13:46 AM   Radiology Dg Chest 2 View  Result Date: 03/19/2018 CLINICAL DATA:  Chest pain. EXAM: CHEST - 2 VIEW COMPARISON:  07/27/2017. FINDINGS: Mediastinum and hilar structures normal. Heart size normal. Left mid lung field subsegmental atelectasis. Small left pleural effusion can not be excluded. No pneumothorax. No acute bony abnormality. IMPRESSION: Mild left base subsegmental atelectasis. Small left pleural effusion. Electronically Signed   By: Marcello Moores  Register   On: 03/19/2018 09:02     Procedures Procedures (including critical care time)  Medications Ordered in ED Medications  sodium chloride 0.9 % bolus 1,000  mL (1,000 mLs Intravenous New Bag/Given 03/19/18 0941)  ketorolac (TORADOL) 30 MG/ML injection 30 mg (30 mg Intravenous Given 03/19/18 0942)     Initial Impression / Assessment and Plan / ED Course  I have reviewed the triage vital signs and the nursing notes.  Pertinent labs & imaging results that were available during my care of the patient were reviewed by me and considered in my medical decision making (see chart for details).    CP atypical with a MSK component.  Dizziness has improved after IVFs.  She has a heart score of 1.  She will be d/c home with celebrex.  Return if worse.  F/u with pcp.  Final Clinical Impressions(s) / ED Diagnoses   Final diagnoses:  Atypical chest pain  Dehydration    ED Discharge Orders         Ordered    celecoxib (CELEBREX) 200 MG capsule  2 times daily     03/19/18 1021           Isla Pence, MD 03/19/18 1022

## 2018-03-19 NOTE — ED Notes (Signed)
Patient verbalizes understanding of discharge instructions. Opportunity for questioning and answers were provided. Armband removed by staff, pt discharged from ED. Pt ambulatory to lobby. Prescriptions and follow up care reviewed.

## 2019-01-19 ENCOUNTER — Emergency Department (HOSPITAL_COMMUNITY)
Admission: EM | Admit: 2019-01-19 | Discharge: 2019-01-19 | Disposition: A | Discharge status: Home / Self Care | Payer: No Typology Code available for payment source | Attending: Emergency Medicine | Admitting: Emergency Medicine

## 2019-01-19 ENCOUNTER — Emergency Department (HOSPITAL_COMMUNITY): Payer: No Typology Code available for payment source

## 2019-01-19 ENCOUNTER — Other Ambulatory Visit: Payer: Self-pay

## 2019-01-19 ENCOUNTER — Encounter (HOSPITAL_COMMUNITY): Payer: Self-pay | Admitting: Emergency Medicine

## 2019-01-19 DIAGNOSIS — Z20828 Contact with and (suspected) exposure to other viral communicable diseases: Secondary | ICD-10-CM | POA: Diagnosis not present

## 2019-01-19 DIAGNOSIS — Z7982 Long term (current) use of aspirin: Secondary | ICD-10-CM | POA: Diagnosis not present

## 2019-01-19 DIAGNOSIS — B349 Viral infection, unspecified: Secondary | ICD-10-CM

## 2019-01-19 DIAGNOSIS — R509 Fever, unspecified: Secondary | ICD-10-CM | POA: Insufficient documentation

## 2019-01-19 DIAGNOSIS — Z79899 Other long term (current) drug therapy: Secondary | ICD-10-CM | POA: Diagnosis not present

## 2019-01-19 DIAGNOSIS — R079 Chest pain, unspecified: Secondary | ICD-10-CM | POA: Diagnosis present

## 2019-01-19 DIAGNOSIS — R0602 Shortness of breath: Secondary | ICD-10-CM | POA: Diagnosis not present

## 2019-01-19 LAB — BASIC METABOLIC PANEL
Anion gap: 6 (ref 5–15)
BUN: 9 mg/dL (ref 6–20)
CO2: 26 mmol/L (ref 22–32)
Calcium: 10.2 mg/dL (ref 8.9–10.3)
Chloride: 105 mmol/L (ref 98–111)
Creatinine, Ser: 0.88 mg/dL (ref 0.44–1.00)
GFR calc Af Amer: >60 mL/min (ref >60)
GFR calc non Af Amer: >60 mL/min (ref >60)
Glucose, Bld: 92 mg/dL (ref 70–99)
Potassium: 3.8 mmol/L (ref 3.5–5.1)
Sodium: 137 mmol/L (ref 135–145)

## 2019-01-19 LAB — TROPONIN I (HIGH SENSITIVITY)
Troponin I (High Sensitivity): 3 ng/L (ref <18)
Troponin I (High Sensitivity): 4 ng/L (ref <18)

## 2019-01-19 LAB — CBC WITH DIFFERENTIAL/PLATELET
Abs Immature Granulocytes: 0.01 10*3/uL (ref 0.00–0.07)
Basophils Absolute: 0 10*3/uL (ref 0.0–0.1)
Basophils Relative: 0 %
Eosinophils Absolute: 0.2 10*3/uL (ref 0.0–0.5)
Eosinophils Relative: 3 %
HCT: 38.8 % (ref 36.0–46.0)
Hemoglobin: 12 g/dL (ref 12.0–15.0)
Immature Granulocytes: 0 %
Lymphocytes Relative: 24 %
Lymphs Abs: 1.7 10*3/uL (ref 0.7–4.0)
MCH: 26.5 pg (ref 26.0–34.0)
MCHC: 30.9 g/dL (ref 30.0–36.0)
MCV: 85.8 fL (ref 80.0–100.0)
Monocytes Absolute: 0.5 10*3/uL (ref 0.1–1.0)
Monocytes Relative: 7 %
Neutro Abs: 4.6 10*3/uL (ref 1.7–7.7)
Neutrophils Relative %: 66 %
Platelets: 265 10*3/uL (ref 150–400)
RBC: 4.52 MIL/uL (ref 3.87–5.11)
RDW: 14.6 % (ref 11.5–15.5)
WBC: 6.9 10*3/uL (ref 4.0–10.5)
nRBC: 0 % (ref 0.0–0.2)

## 2019-01-19 MED ORDER — BENZONATATE 100 MG PO CAPS: 100.0000 mg | ORAL_CAPSULE | Freq: Three times a day (TID) | ORAL | 0 refills | Status: DC

## 2019-01-19 NOTE — Discharge Instructions (Signed)
Your labwork and chest xray were reassuring today. We have swabbed you for covid - stay home and self isolate until you receives your results. If negative you may return to normal daily activity. If positive you need to stay home and self isolate for 2 weeks (cleared 01/04/2019).   Take Tylenol as needed for fevers as well as body aches. I have prescribed cough medication for you to take as well - take as prescribed. Please follow up with your PCP.   Return to the ED for any worsening symptoms including worsening chest pain, shortness of breath/difficulty getting deep breath, vomiting blood, dizziness/lightheadedness, or if you pass out.     Person Under Monitoring Name: Theresa Peterson  Location: [place] [address]   Infection Prevention Recommendations for Individuals Confirmed to have, or Being Evaluated for, 2019 Novel Coronavirus (COVID-19) Infection Who Receive Care at Home  Individuals who are confirmed to have, or are being evaluated for, COVID-19 should follow the prevention steps below until a healthcare provider or local or state health department says they can return to normal activities.  Stay home except to get medical care You should restrict activities outside your home, except for getting medical care. Do not go to work, school, or public areas, and do not use public transportation or taxis.  Call ahead before visiting your doctor Before your medical appointment, call the healthcare provider and tell them that you have, or are being evaluated for, COVID-19 infection. This will help the healthcare providers office take steps to keep other people from getting infected. Ask your healthcare provider to call the local or state health department.  Monitor your symptoms Seek prompt medical attention if your illness is worsening (e.g., difficulty breathing). Before going to your medical appointment, call the healthcare provider and tell them that you  have, or are being evaluated for, COVID-19 infection. Ask your healthcare provider to call the local or state health department.  Wear a facemask You should wear a facemask that covers your nose and mouth when you are in the same room with other people and when you visit a healthcare provider. People who live with or visit you should also wear a facemask while they are in the same room with you.  Separate yourself from other people in your home As much as possible, you should stay in a different room from other people in your home. Also, you should use a separate bathroom, if available.  Avoid sharing household items You should not share dishes, drinking glasses, cups, eating utensils, towels, bedding, or other items with other people in your home. After using these items, you should wash them thoroughly with soap and water.  Cover your coughs and sneezes Cover your mouth and nose with a tissue when you cough or sneeze, or you can cough or sneeze into your sleeve. Throw used tissues in a lined trash can, and immediately wash your hands with soap and water for at least 20 seconds or use an alcohol-based hand rub.  Wash your [facility] your hands often and thoroughly with soap and water for at least 20 seconds. You can use an alcohol-based hand sanitizer if soap and water are not available and if your hands are not visibly dirty. Avoid touching your eyes, nose, and mouth with unwashed hands.   Prevention Steps for Caregivers and Household Members of Individuals Confirmed to have, or Being Evaluated for, COVID-19 Infection Being Cared for in the Home  If you live with,  or provide care at home for, a person confirmed to have, or being evaluated for, COVID-19 infection please follow these guidelines to prevent infection:  Follow healthcare providers instructions Make sure that you understand and can help the patient follow any healthcare provider instructions for all care.  Provide  for the patients basic needs You should help the patient with basic needs in the home and provide support for getting groceries, prescriptions, and other personal needs.  Monitor the patients symptoms If they are getting sicker, call his or her medical provider and tell them that the patient has, or is being evaluated for, COVID-19 infection. This will help the healthcare providers office take steps to keep other people from getting infected. Ask the healthcare provider to call the local or state health department.  Limit the number of people who have contact with the patient If possible, have only one caregiver for the patient. Other household members should stay in another home or place of residence. If this is not possible, they should stay in another room, or be separated from the patient as much as possible. Use a separate bathroom, if available. Restrict visitors who do not have an essential need to be in the home.  Keep older adults, very young children, and other sick people away from the patient Keep older adults, very young children, and those who have compromised immune systems or chronic health conditions away from the patient. This includes people with chronic heart, lung, or kidney conditions, diabetes, and cancer.  Ensure good ventilation Make sure that shared spaces in the home have good air flow, such as from an air conditioner or an opened window, weather permitting.  Wash your hands often Wash your hands often and thoroughly with soap and water for at least 20 seconds. You can use an alcohol based hand sanitizer if soap and water are not available and if your hands are not visibly dirty. Avoid touching your eyes, nose, and mouth with unwashed hands. Use disposable paper towels to dry your hands. If not available, use dedicated cloth towels and replace them when they become wet.  Wear a facemask and gloves Wear a disposable facemask at all times in the room and gloves  when you touch or have contact with the patients blood, body fluids, and/or secretions or excretions, such as sweat, saliva, sputum, nasal mucus, vomit, urine, or feces.  Ensure the mask fits over your nose and mouth tightly, and do not touch it during use. Throw out disposable facemasks and gloves after using them. Do not reuse. Wash your hands immediately after removing your facemask and gloves. If your personal clothing becomes contaminated, carefully remove clothing and launder. Wash your hands after handling contaminated clothing. Place all used disposable facemasks, gloves, and other waste in a lined container before disposing them with other household waste. Remove gloves and wash your hands immediately after handling these items.  Do not share dishes, glasses, or other household items with the patient Avoid sharing household items. You should not share dishes, drinking glasses, cups, eating utensils, towels, bedding, or other items with a patient who is confirmed to have, or being evaluated for, COVID-19 infection. After the person uses these items, you should wash them thoroughly with soap and water.  Wash laundry thoroughly Immediately remove and wash clothes or bedding that have blood, body fluids, and/or secretions or excretions, such as sweat, saliva, sputum, nasal mucus, vomit, urine, or feces, on them. Wear gloves when handling laundry from the patient. Read and  follow directions on labels of laundry or clothing items and detergent. In general, wash and dry with the warmest temperatures recommended on the label.  Clean all areas the individual has used often Clean all touchable surfaces, such as counters, tabletops, doorknobs, bathroom fixtures, toilets, phones, keyboards, tablets, and bedside tables, every day. Also, clean any surfaces that may have blood, body fluids, and/or secretions or excretions on them. Wear gloves when cleaning surfaces the patient has come in contact  with. Use a diluted bleach solution (e.g., dilute bleach with 1 part bleach and 10 parts water) or a household disinfectant with a label that says EPA-registered for coronaviruses. To make a bleach solution at home, add 1 tablespoon of bleach to 1 quart (4 cups) of water. For a larger supply, add  cup of bleach to 1 gallon (16 cups) of water. Read labels of cleaning products and follow recommendations provided on product labels. Labels contain instructions for safe and effective use of the cleaning product including precautions you should take when applying the product, such as wearing gloves or eye protection and making sure you have good ventilation during use of the product. Remove gloves and wash hands immediately after cleaning.  Monitor yourself for signs and symptoms of illness Caregivers and household members are considered close contacts, should monitor their health, and will be asked to limit movement outside of the home to the extent possible. Follow the monitoring steps for close contacts listed on the symptom monitoring form.   ? If you have additional questions, contact your local health department or call the epidemiologist on call at [phone] (available 24/7). ? This guidance is subject to change. For the most up-to-date guidance from [facility], please refer to their website: [email or web address]

## 2019-01-19 NOTE — ED Triage Notes (Signed)
Patient presents with diarrhea, fever, coughing congestion and chest pain. She states that "sinus infection" like symptoms started Sunday, coughing started yesterday, and chest pain started today.

## 2019-01-19 NOTE — ED Provider Notes (Signed)
[facility] DEPT Provider Note   CSN: [id number] Arrival date & time: 01/19/19  1050     History   Chief Complaint Chief Complaint  Patient presents with  . Chest Pain  . Cough  . Fever  . Shortness of Breath  . Diarrhea    HPI Theresa Peterson is a 53 y.o. female with PMHx anemia, anxiety, GERD who presents to the ED today with multiple complaints.  Reports that she began having a sore throat 2 days ago.  She reports that later on she began having a dry cough as well as subjective fevers, chills, and diarrhea.  Patient was unable to take her temperature.  She reports that she has also had some intermittent chest pains especially with activity for the past few days.  She reports that she recently took care of her grandchildren who were sneezing excessively but otherwise pt denies recent sick contacts.  Patient reports her sore throat has resolved.  She denies any difficulty eating or drinking.  No drooling.  No voice change.  Denies shortness of breath, leg swelling, hemoptysis, vomiting, abdominal pain, melena, hematochezia, or any other associated symptoms.        Past Medical History:  Diagnosis Date  . Anemia   . Anemia   . Anxiety   . Arthritis   . Fibroids   . GERD (gastroesophageal reflux disease)   . Heart murmur   . Hemorrhoids   . Obesity   . Ovarian cyst   . Retroperitoneal fibrosis     Patient Active Problem List   Diagnosis Date Noted  . Symptomatic anemia 02/26/2015  . Vaginal bleeding 02/26/2015  . Atypical chest pain 02/08/2013  . Chest pain 12/10/2010  . W 12/10/2010  . Palpitations 12/10/2010  . GERD (gastroesophageal reflux disease) 10/18/2010    Past Surgical History:  Procedure Laterality Date  . ABDOMINAL HYSTERECTOMY       OB History    Gravida  1   Para  1   Term  1   Preterm      AB      Living  1     SAB      TAB      Ectopic      Multiple      Live Births                Home Medications    Prior to Admission medications   Medication Sig Start Date End Date Taking? Authorizing Provider  aspirin 81 MG chewable tablet Chew 81 mg by mouth daily.    [provider]  benzonatate (TESSALON) 100 MG capsule Take 1 capsule (100 mg total) by mouth every 8 (eight) hours. 01/19/19   Breck Maryland, PA-C  CANNABIDIOL PO Take 500 mg by mouth every other day.    [provider]  celecoxib (CELEBREX) 200 MG capsule Take 1 capsule (200 mg total) by mouth 2 (two) times daily. 03/19/18   Isla Pence, MD  cyclobenzaprine (FLEXERIL) 5 MG tablet Take 2 tablets (10 mg total) by mouth 3 (three) times daily as needed for muscle spasms. 10/31/15   Virgel Manifold, MD  diclofenac (VOLTAREN) 50 MG EC tablet Take 1 tablet (50 mg total) by mouth 2 (two) times daily. 02/26/18   Ashley Murrain, NP  Multiple Vitamin (MULTIVITAMIN WITH MINERALS) TABS tablet Take 1 tablet by mouth daily.    [provider]  omeprazole (PRILOSEC) 40 MG capsule Take 40 mg by mouth  daily.    [provider]    Family History Family History  Problem Relation Age of Onset  . Breast cancer Maternal Aunt   . Heart disease Maternal Aunt   . Hypertension Mother   . Renal Disease Mother   . Colon cancer Maternal Uncle   . Pancreatic cancer Maternal Grandmother   . Cancer Maternal Grandmother        pancreas  . Diabetes Maternal Uncle     Social History Social History   Tobacco Use  . Smoking status: Never Smoker  . Smokeless tobacco: Never Used  Substance Use Topics  . Alcohol use: Yes    Comment: occasionally  . Drug use: No     Allergies   Shellfish allergy, Avocado, and Flagyl [metronidazole hcl]   Review of Systems Review of Systems  Constitutional: Positive for chills, fatigue and fever (subjective).  HENT: Positive for congestion and sore throat (resolved). Negative for ear pain, trouble swallowing and voice change.   Eyes: Negative for visual  disturbance.  Respiratory: Positive for cough. Negative for shortness of breath.   Cardiovascular: Positive for chest pain. Negative for palpitations and leg swelling.  Gastrointestinal: Positive for diarrhea and nausea. Negative for abdominal pain and vomiting.  Genitourinary: Negative for difficulty urinating.  Musculoskeletal: Negative for myalgias.  Skin: Negative for rash.  Neurological: Negative for headaches.     Physical Exam Updated Vital Signs BP (!) 135/97 (BP Location: Left Arm)   Pulse 86   Temp 98.8 F (37.1 C) (Oral)   Resp 18   LMP 06/10/2015 Comment: irreg bleeding, last was on 4/3  SpO2 97%   Physical Exam Vitals signs and nursing note reviewed.  Constitutional:      Appearance: She is obese. She is not ill-appearing or diaphoretic.  HENT:     Head: Normocephalic and atraumatic.  Eyes:     Conjunctiva/sclera: Conjunctivae normal.  Neck:     Musculoskeletal: Neck supple.  Cardiovascular:     Rate and Rhythm: Normal rate and regular rhythm.     Pulses: Normal pulses.  Pulmonary:     Effort: Pulmonary effort is normal.     Breath sounds: Normal breath sounds. No wheezing, rhonchi or rales.  Chest:     Chest wall: Tenderness present.    Abdominal:     Palpations: Abdomen is soft.     Tenderness: There is no abdominal tenderness. There is no guarding or rebound.  Musculoskeletal:     Right lower leg: No edema.     Left lower leg: No edema.  Skin:    General: Skin is warm and dry.  Neurological:     Mental Status: She is alert.      ED Treatments / Results  Labs (all labs ordered are listed, but only abnormal results are displayed) Labs Reviewed  NOVEL CORONAVIRUS, NAA (HOSP ORDER, SEND-OUT TO REF LAB; TAT 18-24 HRS)  BASIC METABOLIC PANEL  CBC WITH DIFFERENTIAL/PLATELET  TROPONIN I (HIGH SENSITIVITY)  TROPONIN I (HIGH SENSITIVITY)    EKG EKG Interpretation  Date/Time:  Tuesday January 19 2019 11:57:55 EST Ventricular Rate:  78 PR  Interval:    QRS Duration: 79 QT Interval:  379 QTC Calculation: 432 R Axis:   86 Text Interpretation: Sinus rhythm Borderline repolarization abnormality no change since last tracing Confirmed by Dorie Rank [phone]) on 01/19/2019 12:28:50 PM   Radiology Dg Chest Port 1 View  Result Date: 01/19/2019 CLINICAL DATA:  Cough and chest pain EXAM: PORTABLE CHEST 1  VIEW COMPARISON:  March 19, 2018 FINDINGS: There is slight atelectatic change in the left lower lung region. Lungs elsewhere clear. Heart is borderline enlarged with pulmonary vascularity normal. No adenopathy. No bone lesions. IMPRESSION: Slight left lower lung region atelectasis. Lungs elsewhere clear. Heart borderline prominent. No adenopathy evident. Electronically Signed   By: Lowella Grip III M.D.   On: 01/19/2019 12:30    Procedures Procedures (including critical care time)  Medications Ordered in ED Medications - No data to display   Initial Impression / Assessment and Plan / ED Course  I have reviewed the triage vital signs and the nursing notes.  Pertinent labs & imaging results that were available during my care of the patient were reviewed by me and considered in my medical decision making (see chart for details).    53 year old female presents the ED today complaining of cough, chest pain, diarrhea, fevers for the past several days.  She is denying any sick contacts. She had sore throat earlier in the week but states this has resolved. Vital signs stable today.  Patient afebrile without tachycardia or tachypnea.  She is able to speak in full sentences without accessory muscle use. Pt does have some reproducible chest wall TTP on exam; reports her chest pain has been intermittent and worsened with exertion. Pt does not have many risk factors for ACS besides obesity - regardless will obtain troponin; pt has hx of atypical chest pain. Reports she has had a stress test in the past but years ago. Will swab for covid at  this time as well as baseline screening labs, EKG, and chest x ray.   Xray with some atelectasis in left lung base; no consolidation. CBC without leukocytosis. Hgb stable. No electrolyte abnormalities today despite diarrhea. Initial troponin of 3 - heart score of 4; given this will repeat troponin at this time. If unchanged pt to be discharged home awaiting covid results. She is advised to self isolate until she receives her results. If positive she will need to stay home for 14 days; pt does take care of her elderly mother although does not live with her. Advised to avoid contact to avoid risk of exposure if she is positive. Pt to follow up with PCP. She is in agreement with plan.   Repeat troponin 4; again heart score 4. Chest pain appears to be atypical for ACS; likely more due to suspected covid diagnosis. Pt advised to follow up with PCP regarding chest pain. Have advised that she take Tylenol PRN for fevers and body aches. Will give tessalon perles as pt actively coughing in room. Strict return precautions have been discussed including worsening chest pain, shortness of breath/difficulty getting deep breath, vomiting blood, dizziness/lightheadedness, or if pt passes out. She is in agreement with plan and stable for discharge home pending covid results; will self isolate until she hears.   This note was prepared using Dragon voice recognition software and may include unintentional dictation errors due to the inherent limitations of voice recognition software.  Theresa Peterson was evaluated in Emergency Department on 01/19/2019 for the symptoms described in the history of present illness. She was evaluated in the context of the global COVID-19 pandemic, which necessitated consideration that the patient might be at risk for infection with the SARS-CoV-2 virus that causes COVID-19. Institutional protocols and algorithms that pertain to the evaluation of patients at risk for COVID-19 are in a state of  rapid change based on information released by regulatory bodies including the CDC  and federal and state organizations. These policies and algorithms were followed during the patient's care in the ED.       Final Clinical Impressions(s) / ED Diagnoses   Final diagnoses:  Viral illness  Nonspecific chest pain    ED Discharge Orders         Ordered    benzonatate (TESSALON) 100 MG capsule  Every 8 hours     01/19/19 1615           Eustaquio Maize, PA-C 01/19/19 1617    Dorie Rank, MD 01/20/19 [phone]

## 2019-01-20 LAB — NOVEL CORONAVIRUS, NAA (HOSP ORDER, SEND-OUT TO REF LAB; TAT 18-24 HRS): SARS-CoV-2, NAA: NOT DETECTED

## 2019-02-17 ENCOUNTER — Other Ambulatory Visit: Payer: No Typology Code available for payment source

## 2019-02-24 ENCOUNTER — Emergency Department (HOSPITAL_COMMUNITY): Payer: No Typology Code available for payment source

## 2019-02-24 ENCOUNTER — Emergency Department (HOSPITAL_COMMUNITY)
Admission: EM | Admit: 2019-02-24 | Discharge: 2019-02-24 | Disposition: A | Discharge status: Home / Self Care | Payer: No Typology Code available for payment source | Attending: Emergency Medicine | Admitting: Emergency Medicine

## 2019-02-24 DIAGNOSIS — Z7982 Long term (current) use of aspirin: Secondary | ICD-10-CM | POA: Insufficient documentation

## 2019-02-24 DIAGNOSIS — Z79899 Other long term (current) drug therapy: Secondary | ICD-10-CM | POA: Insufficient documentation

## 2019-02-24 DIAGNOSIS — U071 COVID-19: Secondary | ICD-10-CM | POA: Diagnosis not present

## 2019-02-24 DIAGNOSIS — Z791 Long term (current) use of non-steroidal anti-inflammatories (NSAID): Secondary | ICD-10-CM | POA: Diagnosis not present

## 2019-02-24 MED ORDER — ALBUTEROL SULFATE HFA 108 (90 BASE) MCG/ACT IN AERS
1.0000 | INHALATION_SPRAY | RESPIRATORY_TRACT | Status: DC | PRN
  Administered 2019-02-24: 2 via RESPIRATORY_TRACT
  Filled 2019-02-24: qty 6.7

## 2019-02-24 MED ORDER — ONDANSETRON HCL 4 MG PO TABS
4.0000 mg | ORAL_TABLET | Freq: Once | ORAL | Status: AC
  Administered 2019-02-24: 4 mg via ORAL
  Filled 2019-02-24: qty 1

## 2019-02-24 MED ORDER — ONDANSETRON 4 MG PO TBDP: 4.0000 mg | ORAL_TABLET | Freq: Three times a day (TID) | ORAL | 0 refills | Status: DC | PRN

## 2019-02-24 MED ORDER — AEROCHAMBER PLUS FLO-VU MEDIUM MISC
1.0000 | Freq: Once | Status: AC
  Administered 2019-02-24: 1
  Filled 2019-02-24: qty 1

## 2019-02-24 NOTE — ED Provider Notes (Signed)
[facility] DEPT Provider Note   CSN: [id number] Arrival date & time: 02/24/19  1807     History Chief Complaint  Patient presents with  . Shortness of Breath    Theresa Peterson is a 53 y.o. female.  Pt presents to the ED today with sob.  She was diagnosed with covid on 12/14.  She had aches, headache, and diarrhea, and loss of smell.  She felt like she was getting better until she developed sob today.  She said she was driving and felt very sob.  She said her lips got numb and her legs felt numb.  She pulled over and called EMS.  The pt does have a hx of anxiety.  She feels nauseous now, not sob.        Past Medical History:  Diagnosis Date  . Anemia   . Anemia   . Anxiety   . Arthritis   . Fibroids   . GERD (gastroesophageal reflux disease)   . Heart murmur   . Hemorrhoids   . Obesity   . Ovarian cyst   . Retroperitoneal fibrosis     Patient Active Problem List   Diagnosis Date Noted  . Symptomatic anemia 02/26/2015  . Vaginal bleeding 02/26/2015  . Atypical chest pain 02/08/2013  . Chest pain 12/10/2010  . W 12/10/2010  . Palpitations 12/10/2010  . GERD (gastroesophageal reflux disease) 10/18/2010    Past Surgical History:  Procedure Laterality Date  . ABDOMINAL HYSTERECTOMY       OB History    Gravida  1   Para  1   Term  1   Preterm      AB      Living  1     SAB      TAB      Ectopic      Multiple      Live Births              Family History  Problem Relation Age of Onset  . Breast cancer Maternal Aunt   . Heart disease Maternal Aunt   . Hypertension Mother   . Renal Disease Mother   . Colon cancer Maternal Uncle   . Pancreatic cancer Maternal Grandmother   . Cancer Maternal Grandmother        pancreas  . Diabetes Maternal Uncle     Social History   Tobacco Use  . Smoking status: Never Smoker  . Smokeless tobacco: Never Used  Substance Use Topics  . Alcohol use: Yes   Comment: occasionally  . Drug use: No    Home Medications Prior to Admission medications   Medication Sig Start Date End Date Taking? Authorizing Provider  aspirin 81 MG chewable tablet Chew 81 mg by mouth daily.    [provider]  benzonatate (TESSALON) 100 MG capsule Take 1 capsule (100 mg total) by mouth every 8 (eight) hours. 01/19/19   Venter, Margaux, PA-C  CANNABIDIOL PO Take 500 mg by mouth every other day.    [provider]  celecoxib (CELEBREX) 200 MG capsule Take 1 capsule (200 mg total) by mouth 2 (two) times daily. 03/19/18   Isla Pence, MD  cyclobenzaprine (FLEXERIL) 5 MG tablet Take 2 tablets (10 mg total) by mouth 3 (three) times daily as needed for muscle spasms. 10/31/15   Virgel Manifold, MD  diclofenac (VOLTAREN) 50 MG EC tablet Take 1 tablet (50 mg total) by mouth 2 (two) times daily. 02/26/18   Janit Bern,  Hope M, NP  Multiple Vitamin (MULTIVITAMIN WITH MINERALS) TABS tablet Take 1 tablet by mouth daily.    [provider]  omeprazole (PRILOSEC) 40 MG capsule Take 40 mg by mouth daily.    [provider]  ondansetron (ZOFRAN ODT) 4 MG disintegrating tablet Take 1 tablet (4 mg total) by mouth every 8 (eight) hours as needed. 02/24/19   Isla Pence, MD    Allergies    Shellfish allergy, Avocado, and Flagyl [metronidazole hcl]  Review of Systems   Review of Systems  Respiratory: Positive for shortness of breath.   All other systems reviewed and are negative.   Physical Exam Updated Vital Signs BP (!) 171/99 (BP Location: Right Arm)   Pulse 69   Temp 98.6 F (37 C) (Oral)   Resp 13   LMP 06/10/2015 Comment: irreg bleeding, last was on 4/3  SpO2 100%   Physical Exam Vitals and nursing note reviewed.  Constitutional:      Appearance: She is well-developed.  HENT:     Head: Normocephalic and atraumatic.     Mouth/Throat:     Mouth: Mucous membranes are moist.     Pharynx: Oropharynx is clear.  Eyes:     Extraocular  Movements: Extraocular movements intact.     Pupils: Pupils are equal, round, and reactive to light.  Cardiovascular:     Rate and Rhythm: Normal rate and regular rhythm.  Pulmonary:     Effort: Pulmonary effort is normal.     Breath sounds: Normal breath sounds.  Abdominal:     General: Bowel sounds are normal.     Palpations: Abdomen is soft.  Musculoskeletal:        General: Normal range of motion.     Cervical back: Normal range of motion and neck supple.  Skin:    General: Skin is warm and dry.     Capillary Refill: Capillary refill takes less than 2 seconds.  Neurological:     General: No focal deficit present.     Mental Status: She is alert and oriented to person, place, and time.  Psychiatric:        Mood and Affect: Mood normal.        Behavior: Behavior normal.     ED Results / Procedures / Treatments   Labs (all labs ordered are listed, but only abnormal results are displayed) Labs Reviewed - No data to display  EKG None  Radiology DG Chest Portable 1 View  Result Date: 02/24/2019 CLINICAL DATA:  Shortness of breath and anxiety. COVID-19 testing 2 days ago, results unknown. EXAM: PORTABLE CHEST 1 VIEW COMPARISON:  Radiographs 01/19/2019 and 03/19/2018. FINDINGS: 1845 hours. Stable linear atelectasis or scarring in left infrahilar region. The lungs are otherwise clear. There is no pleural effusion or pneumothorax. The heart size is stable at the upper limits of normal. The bones appear normal. Telemetry leads overlie the chest. IMPRESSION: Stable chest.  No active cardiopulmonary process. Electronically Signed   By: Richardean Sale M.D.   On: 02/24/2019 19:07    Procedures Procedures (including critical care time)  Medications Ordered in ED Medications  albuterol (VENTOLIN HFA) 108 (90 Base) MCG/ACT inhaler 1-2 puff (has no administration in time range)  AeroChamber Plus Flo-Vu Medium MISC 1 each (has no administration in time range)  ondansetron (ZOFRAN)  tablet 4 mg (4 mg Oral Given 02/24/19 1843)    ED Course  I have reviewed the triage vital signs and the nursing notes.  Pertinent labs &  imaging results that were available during my care of the patient were reviewed by me and considered in my medical decision making (see chart for details).    MDM Rules/Calculators/A&P                     Pt is oxygenating 100%.  She looks to be in no distress.  CXR neg for pna.  I do not think she has a PE.  She is able to walk to the restroom without getting sob. She is given an albuterol inhaler and spacer to take if she develops sob again.  She is stable for d/c.  Return if worse.  Theresa Peterson was evaluated in Emergency Department on 02/24/2019 for the symptoms described in the history of present illness. She was evaluated in the context of the global COVID-19 pandemic, which necessitated consideration that the patient might be at risk for infection with the SARS-CoV-2 virus that causes COVID-19. Institutional protocols and algorithms that pertain to the evaluation of patients at risk for COVID-19 are in a state of rapid change based on information released by regulatory bodies including the CDC and federal and state organizations. These policies and algorithms were followed during the patient's care in the ED. Final Clinical Impression(s) / ED Diagnoses Final diagnoses:  COVID-19 virus infection    Rx / DC Orders ED Discharge Orders         Ordered    ondansetron (ZOFRAN ODT) 4 MG disintegrating tablet  Every 8 hours PRN     02/24/19 1930           Isla Pence, MD 02/24/19 1930

## 2019-02-24 NOTE — ED Triage Notes (Addendum)
Patient arrived via [facility], patient is AOx4 and ambulatory. Patient called [phone] 30 minutes into while driving due to feeling short of breath and has some anxiety hx. Patient was tested for COVID this past Monday. Patient vitals are WNL. Patient appeared anxious about testing positive per EMS.  ED Provider at bedside.

## 2020-01-10 ENCOUNTER — Emergency Department (HOSPITAL_COMMUNITY)
Admission: EM | Admit: 2020-01-10 | Discharge: 2020-01-10 | Disposition: A | Discharge status: Home / Self Care | Payer: No Typology Code available for payment source | Attending: Emergency Medicine | Admitting: Emergency Medicine

## 2020-01-10 ENCOUNTER — Emergency Department (HOSPITAL_COMMUNITY): Payer: No Typology Code available for payment source

## 2020-01-10 ENCOUNTER — Encounter (HOSPITAL_COMMUNITY): Payer: Self-pay

## 2020-01-10 ENCOUNTER — Other Ambulatory Visit: Payer: Self-pay

## 2020-01-10 ENCOUNTER — Ambulatory Visit (HOSPITAL_COMMUNITY): Payer: Self-pay

## 2020-01-10 DIAGNOSIS — W19XXXA Unspecified fall, initial encounter: Secondary | ICD-10-CM | POA: Insufficient documentation

## 2020-01-10 DIAGNOSIS — S63631A Sprain of interphalangeal joint of left index finger, initial encounter: Secondary | ICD-10-CM | POA: Diagnosis not present

## 2020-01-10 DIAGNOSIS — Y92009 Unspecified place in unspecified non-institutional (private) residence as the place of occurrence of the external cause: Secondary | ICD-10-CM | POA: Diagnosis not present

## 2020-01-10 DIAGNOSIS — S6992XA Unspecified injury of left wrist, hand and finger(s), initial encounter: Secondary | ICD-10-CM | POA: Diagnosis present

## 2020-01-10 DIAGNOSIS — Z7982 Long term (current) use of aspirin: Secondary | ICD-10-CM | POA: Insufficient documentation

## 2020-01-10 MED ORDER — NAPROXEN 500 MG PO TABS
500.0000 mg | ORAL_TABLET | Freq: Once | ORAL | Status: DC
  Filled 2020-01-10: qty 1

## 2020-01-10 NOTE — ED Notes (Addendum)
Discharge instructions discussed with patient. Verbalized understanding. Departs ED at this time in stable condition with finger splint in place.

## 2020-01-10 NOTE — ED Triage Notes (Signed)
Pt reports a fall yesterday injuring left 2nd digit. Swelling observed.

## 2020-01-10 NOTE — ED Notes (Signed)
Patient transported to X-ray 

## 2020-01-10 NOTE — ED Provider Notes (Signed)
[facility] DEPT Provider Note: Georgena Spurling, MD, FACEP  CSN: [id number] MRN: [id number] ARRIVAL: 01/10/20 at [place]: [place] [facility]   HISTORY OF PRESENT ILLNESS  01/10/20 5:23 AM Theresa Peterson is a 54 y.o. female lost her footing and fell at home about 9:30 AM yesterday morning.  She injured her left index finger.  Overnight the finger developed increased pain and swelling at her PIP joint.  Range of motion is decreased at the PIP joint.  She rates associated pain is a 4 out of 10, worse with palpation or attempted movement.  She states she also struck her head but denies any significant injury from that.  She has taken aspirin for the pain.   Past Medical History:  Diagnosis Date  . Anemia   . Anxiety   . Arthritis   . Fibroids   . GERD (gastroesophageal reflux disease)   . Heart murmur   . Hemorrhoids   . Obesity   . Ovarian cyst   . Retroperitoneal fibrosis     Past Surgical History:  Procedure Laterality Date  . ABDOMINAL HYSTERECTOMY      Family History  Problem Relation Age of Onset  . Breast cancer Maternal Aunt   . Heart disease Maternal Aunt   . Hypertension Mother   . Renal Disease Mother   . Colon cancer Maternal Uncle   . Pancreatic cancer Maternal Grandmother   . Cancer Maternal Grandmother        pancreas  . Diabetes Maternal Uncle     Social History   Tobacco Use  . Smoking status: Never Smoker  . Smokeless tobacco: Never Used  Vaping Use  . Vaping Use: Never used  Substance Use Topics  . Alcohol use: Yes    Comment: occasionally  . Drug use: No    Prior to Admission medications   Medication Sig Start Date End Date Taking? Authorizing Provider  aspirin 81 MG chewable tablet Chew 81 mg by mouth daily.    [provider]  benzonatate (TESSALON) 100 MG capsule Take 1 capsule (100 mg total) by mouth every 8 (eight) hours. 01/19/19   Venter, Margaux, PA-C  CANNABIDIOL PO Take 500 mg by  mouth every other day.    [provider]  celecoxib (CELEBREX) 200 MG capsule Take 1 capsule (200 mg total) by mouth 2 (two) times daily. 03/19/18   Isla Pence, MD  cyclobenzaprine (FLEXERIL) 5 MG tablet Take 2 tablets (10 mg total) by mouth 3 (three) times daily as needed for muscle spasms. 10/31/15   Virgel Manifold, MD  diclofenac (VOLTAREN) 50 MG EC tablet Take 1 tablet (50 mg total) by mouth 2 (two) times daily. 02/26/18   Ashley Murrain, NP  Multiple Vitamin (MULTIVITAMIN WITH MINERALS) TABS tablet Take 1 tablet by mouth daily.    [provider]  omeprazole (PRILOSEC) 40 MG capsule Take 40 mg by mouth daily.    [provider]  ondansetron (ZOFRAN ODT) 4 MG disintegrating tablet Take 1 tablet (4 mg total) by mouth every 8 (eight) hours as needed. 02/24/19   Isla Pence, MD    Allergies Shellfish allergy, Avocado, Flagyl [metronidazole hcl], and Metronidazole   REVIEW OF SYSTEMS  Negative except as noted here or in the History of Present Illness.   PHYSICAL EXAMINATION  Initial Vital Signs Blood pressure 137/86, pulse 64, temperature 98.6 F (37 C), temperature source Oral, resp. rate 16, height 5\' 4"  (1.626 m), weight 126.6  kg, last menstrual period 06/10/2015, SpO2 99 %.  Examination General: Well-developed, well-nourished female in no acute distress; appearance consistent with age of record HENT: normocephalic; no obvious hematoma Eyes: Normal appearance Neck: supple Heart: regular rate and rhythm Lungs: clear to auscultation bilaterally Abdomen: soft; nondistended; nontender; bowel sounds present Extremities: No deformity; tenderness, swelling and decreased range of motion at PIP joint and proximal phalanx of left index finger:    Neurologic: Awake, alert and oriented; motor function intact in all extremities and symmetric; no facial droop Skin: Warm and dry Psychiatric: Normal mood and affect   RESULTS  Summary of this visit's  results, reviewed and interpreted by myself:   EKG Interpretation  Date/Time:    Ventricular Rate:    PR Interval:    QRS Duration:   QT Interval:    QTC Calculation:   R Axis:     Text Interpretation:        Laboratory Studies: No results found for this or any previous visit (from the past 24 hour(s)). Imaging Studies: DG Finger Index Left  Result Date: 01/10/2020 CLINICAL DATA:  [place]. Pain and swelling. EXAM: LEFT INDEX FINGER 2+V COMPARISON:  None. FINDINGS: The joint spaces are maintained. Two small cystic areas noted in the distal aspect of the proximal phalanx. Possible erosive changes. No acute fracture is identified. Moderate soft tissue swelling noted over the region of the proximal phalanx. IMPRESSION: 1. No acute fracture. 2. Soft tissue swelling. 3. Possible erosive changes involving the proximal phalanx. Electronically Signed   By: Marijo Sanes M.D.   On: 01/10/2020 05:58    ED COURSE and MDM  Nursing notes, initial and subsequent vitals signs, including pulse oximetry, reviewed and interpreted by myself.  Vitals:   01/10/20 0513  BP: 137/86  Pulse: 64  Resp: 16  Temp: 98.6 F (37 C)  TempSrc: Oral  SpO2: 99%  Weight: 126.6 kg  Height: 5\' 4"  (1.626 m)   Medications  naproxen (NAPROSYN) tablet 500 mg (has no administration in time range)    No evidence of fracture.  The finger is likely sprained and we will splint and refer to hand for follow-up.  PROCEDURES  Procedures   ED DIAGNOSES     ICD-10-CM   1. Sprain of interphalangeal joint of left index finger, initial encounter  S63.631A   2. Fall in home, initial encounter  W19.XXXA    Y92.009        Shanon Rosser, MD 01/10/20 [phone]

## 2020-06-21 ENCOUNTER — Other Ambulatory Visit: Payer: Self-pay

## 2020-06-21 ENCOUNTER — Emergency Department (HOSPITAL_COMMUNITY)
Admission: EM | Admit: 2020-06-21 | Discharge: 2020-06-21 | Disposition: A | Discharge status: Home / Self Care | Payer: No Typology Code available for payment source | Attending: Emergency Medicine | Admitting: Emergency Medicine

## 2020-06-21 ENCOUNTER — Encounter (HOSPITAL_COMMUNITY): Payer: Self-pay | Admitting: Emergency Medicine

## 2020-06-21 ENCOUNTER — Emergency Department (HOSPITAL_COMMUNITY): Payer: No Typology Code available for payment source

## 2020-06-21 DIAGNOSIS — R0602 Shortness of breath: Secondary | ICD-10-CM | POA: Insufficient documentation

## 2020-06-21 DIAGNOSIS — Z7982 Long term (current) use of aspirin: Secondary | ICD-10-CM | POA: Diagnosis not present

## 2020-06-21 LAB — CBC WITH DIFFERENTIAL/PLATELET
Abs Immature Granulocytes: 0.03 10*3/uL (ref 0.00–0.07)
Basophils Absolute: 0 10*3/uL (ref 0.0–0.1)
Basophils Relative: 0 %
Eosinophils Absolute: 0.3 10*3/uL (ref 0.0–0.5)
Eosinophils Relative: 5 %
HCT: 37.4 % (ref 36.0–46.0)
Hemoglobin: 11.5 g/dL — ABNORMAL LOW (ref 12.0–15.0)
Immature Granulocytes: 1 %
Lymphocytes Relative: 31 %
Lymphs Abs: 1.7 10*3/uL (ref 0.7–4.0)
MCH: 26 pg (ref 26.0–34.0)
MCHC: 30.7 g/dL (ref 30.0–36.0)
MCV: 84.4 fL (ref 80.0–100.0)
Monocytes Absolute: 0.3 10*3/uL (ref 0.1–1.0)
Monocytes Relative: 5 %
Neutro Abs: 3.2 10*3/uL (ref 1.7–7.7)
Neutrophils Relative %: 58 %
Platelets: 243 10*3/uL (ref 150–400)
RBC: 4.43 MIL/uL (ref 3.87–5.11)
RDW: 14.6 % (ref 11.5–15.5)
WBC: 5.4 10*3/uL (ref 4.0–10.5)
nRBC: 0 % (ref 0.0–0.2)

## 2020-06-21 LAB — BRAIN NATRIURETIC PEPTIDE: B Natriuretic Peptide: 30.3 pg/mL (ref 0.0–100.0)

## 2020-06-21 LAB — BASIC METABOLIC PANEL
Anion gap: 6 (ref 5–15)
BUN: 16 mg/dL (ref 6–20)
CO2: 25 mmol/L (ref 22–32)
Calcium: 10 mg/dL (ref 8.9–10.3)
Chloride: 109 mmol/L (ref 98–111)
Creatinine, Ser: 0.82 mg/dL (ref 0.44–1.00)
GFR, Estimated: >60 mL/min (ref >60)
Glucose, Bld: 100 mg/dL — ABNORMAL HIGH (ref 70–99)
Potassium: 4.2 mmol/L (ref 3.5–5.1)
Sodium: 140 mmol/L (ref 135–145)

## 2020-06-21 LAB — TROPONIN I (HIGH SENSITIVITY): Troponin I (High Sensitivity): 5 ng/L (ref <18)

## 2020-06-21 MED ORDER — ALBUTEROL SULFATE HFA 108 (90 BASE) MCG/ACT IN AERS
1.0000 | INHALATION_SPRAY | Freq: Once | RESPIRATORY_TRACT | Status: AC
  Administered 2020-06-21: 1 via RESPIRATORY_TRACT
  Filled 2020-06-21: qty 6.7

## 2020-06-21 NOTE — ED Triage Notes (Signed)
Patient complains of shortness of breath since last night, used her CPAP last night but noticed it was still bad this morning. Endorses non-productive cough, denies fevers, N/V/D, or swelling.

## 2020-06-21 NOTE — ED Notes (Signed)
Patient ambulated to and from bathroom, stable gait

## 2020-06-21 NOTE — ED Notes (Signed)
Patient ambulated from triage to room 25, stable gait

## 2020-06-21 NOTE — ED Provider Notes (Signed)
[facility] DEPT Provider Note   CSN: [id number] Arrival date & time: 06/21/20  9476     History Chief Complaint  Patient presents with  . Shortness of Breath    Theresa Peterson is a 55 y.o. female.  Patient presents with sensation of shortness of breath.  She states it was early this morning when she felt short of breath.  She tried her CPAP but it did not improve her sensation of difficulty breathing.  She denies fevers or cough.  Denies chest pain or chest pressure or discomfort.  No vomiting or diarrhea.  She states that she feels that she has wheezes.        Past Medical History:  Diagnosis Date  . Anemia   . Anxiety   . Arthritis   . Fibroids   . GERD (gastroesophageal reflux disease)   . Heart murmur   . Hemorrhoids   . Obesity   . Ovarian cyst   . Retroperitoneal fibrosis     Patient Active Problem List   Diagnosis Date Noted  . Symptomatic anemia 02/26/2015  . Vaginal bleeding 02/26/2015  . Atypical chest pain 02/08/2013  . Chest pain 12/10/2010  . W 12/10/2010  . Palpitations 12/10/2010  . GERD (gastroesophageal reflux disease) 10/18/2010    Past Surgical History:  Procedure Laterality Date  . ABDOMINAL HYSTERECTOMY       OB History    Gravida  1   Para  1   Term  1   Preterm      AB      Living  1     SAB      IAB      Ectopic      Multiple      Live Births              Family History  Problem Relation Age of Onset  . Breast cancer Maternal Aunt   . Heart disease Maternal Aunt   . Hypertension Mother   . Renal Disease Mother   . Colon cancer Maternal Uncle   . Pancreatic cancer Maternal Grandmother   . Cancer Maternal Grandmother        pancreas  . Diabetes Maternal Uncle     Social History   Tobacco Use  . Smoking status: Never Smoker  . Smokeless tobacco: Never Used  Vaping Use  . Vaping Use: Never used  Substance Use Topics  . Alcohol use: Yes    Comment:  occasionally  . Drug use: No    Home Medications Prior to Admission medications   Medication Sig Start Date End Date Taking? Authorizing Provider  aspirin 81 MG chewable tablet Chew 81 mg by mouth daily.    [provider]  benzonatate (TESSALON) 100 MG capsule Take 1 capsule (100 mg total) by mouth every 8 (eight) hours. 01/19/19   Venter, Margaux, PA-C  CANNABIDIOL PO Take 500 mg by mouth every other day.    [provider]  celecoxib (CELEBREX) 200 MG capsule Take 1 capsule (200 mg total) by mouth 2 (two) times daily. 03/19/18   Isla Pence, MD  cyclobenzaprine (FLEXERIL) 5 MG tablet Take 2 tablets (10 mg total) by mouth 3 (three) times daily as needed for muscle spasms. 10/31/15   Virgel Manifold, MD  diclofenac (VOLTAREN) 50 MG EC tablet Take 1 tablet (50 mg total) by mouth 2 (two) times daily. 02/26/18   Ashley Murrain, NP  Multiple Vitamin (MULTIVITAMIN WITH MINERALS) TABS tablet  Take 1 tablet by mouth daily.    [provider]  omeprazole (PRILOSEC) 40 MG capsule Take 40 mg by mouth daily.    [provider]  ondansetron (ZOFRAN ODT) 4 MG disintegrating tablet Take 1 tablet (4 mg total) by mouth every 8 (eight) hours as needed. 02/24/19   Isla Pence, MD    Allergies    Shellfish allergy, Avocado, Flagyl [metronidazole hcl], and Metronidazole  Review of Systems   Review of Systems  Constitutional: Negative for fever.  HENT: Negative for ear pain.   Eyes: Negative for pain.  Respiratory: Positive for shortness of breath. Negative for cough.   Cardiovascular: Negative for chest pain.  Gastrointestinal: Negative for abdominal pain.  Genitourinary: Negative for flank pain.  Musculoskeletal: Negative for back pain.  Skin: Negative for rash.  Neurological: Negative for headaches.    Physical Exam Updated Vital Signs BP (!) 154/99   Pulse 66   Temp 98.2 F (36.8 C) (Oral)   Resp 16   Ht 5\' 4"  (1.626 m)   Wt 127 kg   LMP 06/10/2015  Comment: irreg bleeding, last was on 4/3  SpO2 99%   BMI 48.06 kg/m   Physical Exam Constitutional:      General: She is not in acute distress.    Appearance: Normal appearance.  HENT:     Head: Normocephalic.     Nose: Nose normal.  Eyes:     Extraocular Movements: Extraocular movements intact.  Cardiovascular:     Rate and Rhythm: Normal rate.  Pulmonary:     Effort: Pulmonary effort is normal.     Breath sounds: No wheezing.  Musculoskeletal:        General: Normal range of motion.     Cervical back: Normal range of motion.  Neurological:     General: No focal deficit present.     Mental Status: She is alert. Mental status is at baseline.     ED Results / Procedures / Treatments   Labs (all labs ordered are listed, but only abnormal results are displayed) Labs Reviewed  CBC WITH DIFFERENTIAL/PLATELET - Abnormal; Notable for the following components:      Result Value   Hemoglobin 11.5 (*)    All other components within normal limits  BASIC METABOLIC PANEL - Abnormal; Notable for the following components:   Glucose, Bld 100 (*)    All other components within normal limits  BRAIN NATRIURETIC PEPTIDE  TROPONIN I (HIGH SENSITIVITY)    EKG None  Radiology DG Chest Port 1 View  Result Date: 06/21/2020 CLINICAL DATA:  Shortness of breath. EXAM: PORTABLE CHEST 1 VIEW COMPARISON:  02/24/2019. FINDINGS: Mediastinum and hilar structures normal. Cardiomegaly. Mild pulmonary venous congestion cannot be excluded. Very mild bilateral interstitial prominence noted. Mild interstitial edema cannot be excluded. Pneumonitis can not be excluded. Mild left mid lung field subsegmental atelectasis and or scarring again noted. Stable mild elevation left hemidiaphragm. No pneumothorax. No acute bony abnormality. IMPRESSION: Cardiomegaly. Mild pulmonary venous congestion cannot be excluded. Very mild bilateral interstitial prominence noted. Mild interstitial edema cannot be excluded.  Pneumonitis cannot be excluded. 2. Mild left mid lung field subsegmental atelectasis and or scarring again noted. Stable mild elevation left hemidiaphragm. Electronically Signed   By: Marcello Moores  Register   On: 06/21/2020 09:30    Procedures Procedures   Medications Ordered in ED Medications  albuterol (VENTOLIN HFA) 108 (90 Base) MCG/ACT inhaler 1 puff (1 puff Inhalation Given 06/21/20 0906)    ED Course  I have reviewed the triage vital signs and the nursing notes.  Pertinent labs & imaging results that were available during my care of the patient were reviewed by me and considered in my medical decision making (see chart for details).    MDM Rules/Calculators/A&P                          Vital signs are within limits on arrival.  No wheezes noted on pulmonary exam arrival here in the ER.  Patient given albuterol puffs with subsequently feeling better.  Work-up was otherwise unremarkable.  proBNP is normal.  No evidence of pneumonia on x-ray.  Will be discharged home in stable condition with vital signs showing O2 saturation 98% on room air which is appropriate.   Final Clinical Impression(s) / ED Diagnoses Final diagnoses:  SOB (shortness of breath)    Rx / DC Orders ED Discharge Orders    None       Luna Fuse, MD 06/21/20 1025

## 2020-06-21 NOTE — ED Notes (Signed)
Patient states that she is feeling better after the albuterol inhaler

## 2020-06-21 NOTE — Discharge Instructions (Addendum)
Call your primary care doctor or specialist as discussed in the next 2-3 days.   Return immediately back to the ER if:  Your symptoms worsen within the next 12-24 hours. You develop new symptoms such as new fevers, persistent vomiting, new pain, shortness of breath, or new weakness or numbness, or if you have any other concerns.  

## 2020-07-03 ENCOUNTER — Other Ambulatory Visit: Payer: Self-pay | Admitting: Oncology

## 2021-05-26 ENCOUNTER — Ambulatory Visit (HOSPITAL_COMMUNITY): Payer: No Typology Code available for payment source

## 2021-06-28 LAB — HM MAMMOGRAPHY: HM Mammogram: NORMAL (ref 0–4)

## 2022-03-26 ENCOUNTER — Ambulatory Visit
Admission: RE | Admit: 2022-03-26 | Discharge: 2022-03-26 | Disposition: A | Discharge status: Home / Self Care | Payer: No Typology Code available for payment source | Source: Ambulatory Visit | Attending: Nurse Practitioner | Admitting: Nurse Practitioner

## 2022-03-26 VITALS — BP 133/72 | HR 87 | Temp 98.2°F | Resp 16

## 2022-03-26 DIAGNOSIS — N760 Acute vaginitis: Secondary | ICD-10-CM | POA: Insufficient documentation

## 2022-03-26 MED ORDER — FLUCONAZOLE 150 MG PO TABS: 150.0000 mg | ORAL_TABLET | Freq: Every day | ORAL | 0 refills | Status: DC

## 2022-03-26 NOTE — ED Provider Notes (Signed)
[facility]    CSN: [id number] Arrival date & time: 03/26/22  1338      History   Chief Complaint Chief Complaint  Patient presents with   Vaginal Discharge    vaginal pain, burning and numbness. Dark vaginal discharge. - Entered by patient    HPI Theresa Peterson is a 57 y.o. female who presents for evaluation of vaginal discharge. Pt reports symptoms began 1 month ago.  Endorses an intermittent pruritic, thick vaginal discharge with a burning sensation.  Denies dysuria. States she had a UA last week that was normal with her PCP. No STD concern or exposure. No recent antibiotic usage. Patient has a history of vaginal infections such as yeast infection or BV in the past.  Patient states she has had some numbness in the vaginal area for which she is following with the [facility] for this.  Was told it could be secondary to nerve compression due to constipation.  No new symptoms regarding that.  She did try over-the-counter yeast infection medication which temporarily improved her symptoms.   Pt has no other concerns at this time.    Vaginal Discharge   Past Medical History:  Diagnosis Date   Anemia    Anxiety    Arthritis    Fibroids    GERD (gastroesophageal reflux disease)    Heart murmur    Hemorrhoids    Obesity    Ovarian cyst    Retroperitoneal fibrosis     Patient Active Problem List   Diagnosis Date Noted   Symptomatic anemia 02/26/2015   Vaginal bleeding 02/26/2015   Atypical chest pain 02/08/2013   Chest pain 12/10/2010   W 12/10/2010   Palpitations 12/10/2010   GERD (gastroesophageal reflux disease) 10/18/2010    Past Surgical History:  Procedure Laterality Date   ABDOMINAL HYSTERECTOMY      OB History     Gravida  1   Para  1   Term  1   Preterm      AB      Living  1      SAB      IAB      Ectopic      Multiple      Live Births               Home Medications    Prior to Admission medications   Medication Sig  Start Date End Date Taking? Authorizing Provider  fluconazole (DIFLUCAN) 150 MG tablet Take 1 tablet (150 mg total) by mouth daily. May repeat dose in 3 days if symptoms persist 03/26/22  Yes Melynda Ripple, NP  aspirin 81 MG chewable tablet Chew 81 mg by mouth daily.    [provider]  benzonatate (TESSALON) 100 MG capsule Take 1 capsule (100 mg total) by mouth every 8 (eight) hours. 01/19/19   Venter, Margaux, PA-C  CANNABIDIOL PO Take 500 mg by mouth every other day.    [provider]  celecoxib (CELEBREX) 200 MG capsule Take 1 capsule (200 mg total) by mouth 2 (two) times daily. 03/19/18   Isla Pence, MD  cyclobenzaprine (FLEXERIL) 5 MG tablet Take 2 tablets (10 mg total) by mouth 3 (three) times daily as needed for muscle spasms. 10/31/15   Virgel Manifold, MD  diclofenac (VOLTAREN) 50 MG EC tablet Take 1 tablet (50 mg total) by mouth 2 (two) times daily. 02/26/18   Ashley Murrain, NP  Multiple Vitamin (MULTIVITAMIN WITH MINERALS) TABS tablet Take 1  tablet by mouth daily.    [provider]  omeprazole (PRILOSEC) 40 MG capsule Take 40 mg by mouth daily.    [provider]  ondansetron (ZOFRAN ODT) 4 MG disintegrating tablet Take 1 tablet (4 mg total) by mouth every 8 (eight) hours as needed. 02/24/19   Isla Pence, MD    Family History Family History  Problem Relation Age of Onset   Breast cancer Maternal Aunt    Heart disease Maternal Aunt    Hypertension Mother    Renal Disease Mother    Colon cancer Maternal Uncle    Pancreatic cancer Maternal Grandmother    Cancer Maternal Grandmother        pancreas   Diabetes Maternal Uncle     Social History Social History   Tobacco Use   Smoking status: Never   Smokeless tobacco: Never  Vaping Use   Vaping Use: Never used  Substance Use Topics   Alcohol use: Yes    Comment: occasionally   Drug use: No     Allergies   Shellfish allergy, Avocado, Flagyl [metronidazole hcl], Metronidazole,  and Prednisone   Review of Systems Review of Systems  Genitourinary:  Positive for vaginal discharge.     Physical Exam Triage Vital Signs ED Triage Vitals  Enc Vitals Group     BP 03/26/22 1404 133/72     Pulse Rate 03/26/22 1404 87     Resp 03/26/22 1404 16     Temp 03/26/22 1404 98.2 F (36.8 C)     Temp Source 03/26/22 1404 Oral     SpO2 03/26/22 1404 96 %     Weight --      Height --      Head Circumference --      Peak Flow --      Pain Score 03/26/22 1403 6     Pain Loc --      Pain Edu? --      Excl. in [place]? --    No data found.  Updated Vital Signs BP 133/72 (BP Location: Right Arm)   Pulse 87   Temp 98.2 F (36.8 C) (Oral)   Resp 16   LMP 06/10/2015 Comment: irreg bleeding, last was on 4/3  SpO2 96%   Visual Acuity Right Eye Distance:   Left Eye Distance:   Bilateral Distance:    Right Eye Near:   Left Eye Near:    Bilateral Near:     Physical Exam Vitals and nursing note reviewed.  Constitutional:      Appearance: Normal appearance.  HENT:     Head: Normocephalic and atraumatic.  Eyes:     Pupils: Pupils are equal, round, and reactive to light.  Cardiovascular:     Rate and Rhythm: Normal rate.  Pulmonary:     Effort: Pulmonary effort is normal.  Skin:    General: Skin is warm and dry.  Neurological:     General: No focal deficit present.     Mental Status: She is alert and oriented to person, place, and time.  Psychiatric:        Mood and Affect: Mood normal.        Behavior: Behavior normal.      UC Treatments / Results  Labs (all labs ordered are listed, but only abnormal results are displayed) Labs Reviewed  CERVICOVAGINAL ANCILLARY ONLY    EKG   Radiology No results found.  Procedures Procedures (including critical care time)  Medications Ordered in UC Medications -  No data to display  Initial Impression / Assessment and Plan / UC Course  I have reviewed the triage vital signs and the nursing  notes.  Pertinent labs & imaging results that were available during my care of the patient were reviewed by me and considered in my medical decision making (see chart for details).     Reviewed exam and symptoms with patient.  No red flags on exam. Patient opted for self swab for BV/yeast testing.  Declined any STD testing.  Will contact if any results are positive Start Diflucan Follow-up with her gynecologist for further evaluation of symptoms if there is no improvement Strict ER precautions reviewed and patient verbalized understanding Final Clinical Impressions(s) / UC Diagnoses   Final diagnoses:  Acute vaginitis     Discharge Instructions      Diflucan as prescribed The clinical contact you with results of the vaginal swab done today if any additional treatment is indicated I would follow-up with your gynecologist with the [facility] for further evaluation and treatment if your symptoms persist Please go to the emergency room if you have any worsening symptoms   ED Prescriptions     Medication Sig Dispense Auth. Provider   fluconazole (DIFLUCAN) 150 MG tablet Take 1 tablet (150 mg total) by mouth daily. May repeat dose in 3 days if symptoms persist 2 tablet Melynda Ripple, NP      PDMP not reviewed this encounter.   Melynda Ripple, NP 03/26/22 1422

## 2022-03-26 NOTE — ED Triage Notes (Signed)
Pt c/o vaginal pain, discharge and burning sensation that began about a month ago.   Home interventions: tylenol

## 2022-03-26 NOTE — Discharge Instructions (Signed)
Diflucan as prescribed The clinical contact you with results of the vaginal swab done today if any additional treatment is indicated I would follow-up with your gynecologist with the [facility] for further evaluation and treatment if your symptoms persist Please go to the emergency room if you have any worsening symptoms

## 2022-03-27 LAB — CERVICOVAGINAL ANCILLARY ONLY
Bacterial Vaginitis (gardnerella): NEGATIVE
Candida Glabrata: NEGATIVE
Candida Vaginitis: NEGATIVE
Comment: NEGATIVE
Comment: NEGATIVE
Comment: NEGATIVE

## 2022-04-01 ENCOUNTER — Emergency Department (HOSPITAL_COMMUNITY)
Admission: EM | Admit: 2022-04-01 | Discharge: 2022-04-01 | Disposition: A | Discharge status: Home / Self Care | Payer: No Typology Code available for payment source | Attending: Emergency Medicine | Admitting: Emergency Medicine

## 2022-04-01 ENCOUNTER — Emergency Department (HOSPITAL_COMMUNITY): Payer: No Typology Code available for payment source

## 2022-04-01 ENCOUNTER — Encounter (HOSPITAL_COMMUNITY): Payer: Self-pay

## 2022-04-01 DIAGNOSIS — Z7982 Long term (current) use of aspirin: Secondary | ICD-10-CM | POA: Diagnosis not present

## 2022-04-01 DIAGNOSIS — R519 Headache, unspecified: Secondary | ICD-10-CM | POA: Insufficient documentation

## 2022-04-01 LAB — CBC
HCT: 38.4 % (ref 36.0–46.0)
Hemoglobin: 11.7 g/dL — ABNORMAL LOW (ref 12.0–15.0)
MCH: 25.9 pg — ABNORMAL LOW (ref 26.0–34.0)
MCHC: 30.5 g/dL (ref 30.0–36.0)
MCV: 85 fL (ref 80.0–100.0)
Platelets: 302 10*3/uL (ref 150–400)
RBC: 4.52 MIL/uL (ref 3.87–5.11)
RDW: 15 % (ref 11.5–15.5)
WBC: 6.1 10*3/uL (ref 4.0–10.5)
nRBC: 0 % (ref 0.0–0.2)

## 2022-04-01 LAB — BASIC METABOLIC PANEL
Anion gap: 8 (ref 5–15)
BUN: 13 mg/dL (ref 6–20)
CO2: 24 mmol/L (ref 22–32)
Calcium: 10.1 mg/dL (ref 8.9–10.3)
Chloride: 108 mmol/L (ref 98–111)
Creatinine, Ser: 0.83 mg/dL (ref 0.44–1.00)
GFR, Estimated: >60 mL/min (ref >60)
Glucose, Bld: 97 mg/dL (ref 70–99)
Potassium: 4.2 mmol/L (ref 3.5–5.1)
Sodium: 140 mmol/L (ref 135–145)

## 2022-04-01 MED ORDER — KETOROLAC TROMETHAMINE 30 MG/ML IJ SOLN
30.0000 mg | Freq: Once | INTRAMUSCULAR | Status: AC
  Administered 2022-04-01: 30 mg via INTRAMUSCULAR
  Filled 2022-04-01: qty 1

## 2022-04-01 MED ORDER — ONDANSETRON HCL 4 MG PO TABS: 4.0000 mg | ORAL_TABLET | Freq: Four times a day (QID) | ORAL | 0 refills | Status: DC | PRN

## 2022-04-01 NOTE — Discharge Instructions (Addendum)
Please follow-up with PCP for further management Please return to the ED with any new or worsening signs or symptoms Please read attached guide concerning migraine headaches

## 2022-04-01 NOTE — ED Provider Triage Note (Signed)
Emergency Medicine Provider Triage Evaluation Note  Theresa Peterson , a 57 y.o. female  was evaluated in triage.  Pt complains of headache since 1/2.  Patient has been seen by PCP, optometry.  There was thought given to this being giant cell arteritis however after evaluation by optometry, they doubt this.  Patient was provided steroids however was unable to take these due to side effects.  Patient reports that she has history of headaches however this headache is different, increasing severity and frequency.  Patient denies one-sided weakness or numbness, lightheadedness or dizziness.  Patient denies fevers, neck stiffness, light sensitivity.  Patient endorsing nausea without vomiting.  Review of Systems  Positive:  Negative:   Physical Exam  BP (!) 138/96 (BP Location: Left Arm)   Pulse 79   Temp 97.9 F (36.6 C) (Oral)   Resp 16   LMP 06/10/2015 Comment: irreg bleeding, last was on 4/3  SpO2 97%  Gen:   Awake, no distress   Resp:  Normal effort  MSK:   Moves extremities without difficulty  Other:  No focal deficits  Medical Decision Making  Medically screening exam initiated at 12:50 PM.  Appropriate orders placed.  Cerria Tulip Meharg was informed that the remainder of the evaluation will be completed by another provider, this initial triage assessment does not replace that evaluation, and the importance of remaining in the ED until their evaluation is complete.     Azucena Cecil, PA-C 04/01/22 1251

## 2022-04-01 NOTE — ED Triage Notes (Signed)
Pt presents with c/o headache on and off since January 1st. Pt reports that she does have a hx of headaches but never this badly. Pt has seen his PCP for this.

## 2022-04-01 NOTE — ED Provider Notes (Signed)
[facility] Provider Note   CSN: [id number] Arrival date & time: 04/01/22  1034     History  Chief Complaint  Patient presents with   Headache    Theresa Peterson is a 57 y.o. female with medical history of anemia, anxiety, fibroids, GERD, heart murmur, obesity.  Patient presents to ED for evaluation of headache. Patient complains of headache since 1/2.  Patient has been seen by PCP, optometry. There was thought given to patient headaches being related to giant cell arteritis however after evaluation by optometry, they doubt this. Patient was provided steroids however was unable to take these due to side effects.  Patient reports that she has history of headaches however this headache is different, increasing severity and frequency.  Patient denies one-sided weakness or numbness, lightheadedness or dizziness.  Patient denies fevers, neck stiffness, light sensitivity. Patient endorsing nausea without vomiting.    Headache Associated symptoms: nausea   Associated symptoms: no dizziness, no fever, no neck stiffness, no numbness, no photophobia, no vomiting and no weakness        Home Medications Prior to Admission medications   Medication Sig Start Date End Date Taking? Authorizing Provider  ondansetron (ZOFRAN) 4 MG tablet Take 1 tablet (4 mg total) by mouth every 6 (six) hours as needed for nausea or vomiting. 04/01/22  Yes Azucena Cecil, PA-C  aspirin 81 MG chewable tablet Chew 81 mg by mouth daily.    [provider]  benzonatate (TESSALON) 100 MG capsule Take 1 capsule (100 mg total) by mouth every 8 (eight) hours. 01/19/19   Venter, Margaux, PA-C  CANNABIDIOL PO Take 500 mg by mouth every other day.    [provider]  celecoxib (CELEBREX) 200 MG capsule Take 1 capsule (200 mg total) by mouth 2 (two) times daily. 03/19/18   Isla Pence, MD  cyclobenzaprine (FLEXERIL) 5 MG tablet Take 2 tablets (10 mg  total) by mouth 3 (three) times daily as needed for muscle spasms. 10/31/15   Virgel Manifold, MD  diclofenac (VOLTAREN) 50 MG EC tablet Take 1 tablet (50 mg total) by mouth 2 (two) times daily. 02/26/18   Ashley Murrain, NP  fluconazole (DIFLUCAN) 150 MG tablet Take 1 tablet (150 mg total) by mouth daily. May repeat dose in 3 days if symptoms persist 03/26/22   Melynda Ripple, NP  Multiple Vitamin (MULTIVITAMIN WITH MINERALS) TABS tablet Take 1 tablet by mouth daily.    [provider]  omeprazole (PRILOSEC) 40 MG capsule Take 40 mg by mouth daily.    [provider]  ondansetron (ZOFRAN ODT) 4 MG disintegrating tablet Take 1 tablet (4 mg total) by mouth every 8 (eight) hours as needed. 02/24/19   Isla Pence, MD      Allergies    Shellfish allergy, Avocado, Flagyl [metronidazole hcl], Metronidazole, and Prednisone    Review of Systems   Review of Systems  Constitutional:  Negative for fever.  Eyes:  Negative for photophobia.  Gastrointestinal:  Positive for nausea. Negative for vomiting.  Musculoskeletal:  Negative for neck stiffness.  Neurological:  Positive for headaches. Negative for dizziness, weakness, light-headedness and numbness.  All other systems reviewed and are negative.   Physical Exam Updated Vital Signs BP 138/88   Pulse 77   Temp 97.9 F (36.6 C) (Oral)   Resp 16   LMP 06/10/2015 Comment: irreg bleeding, last was on 4/3  SpO2 98%  Physical Exam Vitals and nursing note reviewed.  Constitutional:      General: She is not in acute distress.    Appearance: She is not ill-appearing, toxic-appearing or diaphoretic.  HENT:     Head: Normocephalic and atraumatic.     Nose: Nose normal.     Mouth/Throat:     Mouth: Mucous membranes are moist.     Pharynx: Oropharynx is clear.  Eyes:     Extraocular Movements: Extraocular movements intact.     Conjunctiva/sclera: Conjunctivae normal.     Pupils: Pupils are equal, round, and reactive to light.   Cardiovascular:     Rate and Rhythm: Normal rate and regular rhythm.  Pulmonary:     Effort: Pulmonary effort is normal.     Breath sounds: Normal breath sounds. No wheezing.  Abdominal:     General: Abdomen is flat. Bowel sounds are normal.     Palpations: Abdomen is soft.     Tenderness: There is no abdominal tenderness.  Musculoskeletal:     Cervical back: Normal range of motion and neck supple.  Skin:    General: Skin is warm and dry.     Capillary Refill: Capillary refill takes less than 2 seconds.  Neurological:     General: No focal deficit present.     Mental Status: She is alert and oriented to person, place, and time.     GCS: GCS eye subscore is 4. GCS verbal subscore is 5. GCS motor subscore is 6.     Cranial Nerves: Cranial nerves 2-12 are intact. No cranial nerve deficit.     Sensory: Sensation is intact. No sensory deficit.     Motor: Motor function is intact. No weakness.     Coordination: Coordination is intact. Heel to [facility] Test normal.     ED Results / Procedures / Treatments   Labs (all labs ordered are listed, but only abnormal results are displayed) Labs Reviewed  CBC - Abnormal; Notable for the following components:      Result Value   Hemoglobin 11.7 (*)    MCH 25.9 (*)    All other components within normal limits  BASIC METABOLIC PANEL    EKG None  Radiology CT Head Wo Contrast  Result Date: 04/01/2022 CLINICAL DATA:  Patient with headache. Worsening frequency and severity. EXAM: CT HEAD WITHOUT CONTRAST TECHNIQUE: Contiguous axial images were obtained from the base of the skull through the vertex without intravenous contrast. RADIATION DOSE REDUCTION: This exam was performed according to the departmental dose-optimization program which includes automated exposure control, adjustment of the mA and/or kV according to patient size and/or use of iterative reconstruction technique. COMPARISON:  Brain CT 11/22/2017 FINDINGS: Brain: No evidence of acute  infarction, hemorrhage, hydrocephalus, extra-axial collection or mass lesion/mass effect. Vascular: No hyperdense vessel or unexpected calcification. Skull: Normal. Negative for fracture or focal lesion. Sinuses/Orbits: No acute finding. Other: None. IMPRESSION: No acute intracranial pathology. Electronically Signed   By: Lovey Newcomer M.D.   On: 04/01/2022 13:26    Procedures Procedures   Medications Ordered in ED Medications  ketorolac (TORADOL) 30 MG/ML injection 30 mg (has no administration in time range)    ED Course/ Medical Decision Making/ A&P  Medical Decision Making Amount and/or Complexity of Data Reviewed Labs: ordered. Radiology: ordered.  Risk Prescription drug management.   57 year old female presents to ED for evaluation.  Please see HPI for further details.  On examination patient is afebrile and nontachycardic.  Patient lung sounds are clear bilaterally, she is not hypoxic on room air.  Patient abdomen is soft and compressible throughout.  Neurological examination shows no focal neurodeficits.  Patient workup will include CBC, BMP, CT head.  Patient CBC unremarkable with stable baseline hemoglobin.  Patient BMP unremarkable.  Patient CT head without contrast shows no intracranial abnormalities.  Patient was ordered Toradol however patient never received this.  Patient is requesting discharge at this time, states that she has plans tonight and cannot wait for medication and reassessment. Patient alert and oriented x3.  Patient advised to follow-up with PCP for further management of headache.  Patient discharged in stable condition.  Final Clinical Impression(s) / ED Diagnoses Final diagnoses:  Intractable headache, unspecified chronicity pattern, unspecified headache type    Rx / DC Orders ED Discharge Orders          Ordered    ondansetron (ZOFRAN) 4 MG tablet  Every 6 hours PRN        04/01/22 1809              Azucena Cecil, PA-C 04/01/22  1809    Fransico Meadow, MD 04/04/22 1726

## 2022-04-14 NOTE — Progress Notes (Unsigned)
New Patient Visit  LMP 06/10/2015 Comment: irreg bleeding, last was on 4/3   Subjective:    Patient ID: Theresa Peterson, female    DOB: 10/15/1965, 57 y.o.   MRN: [id number]  CC: No chief complaint on file.   HPI: Theresa Peterson is a 57 y.o. female presents for new patient visit to establish care.  Introduced to Designer, jewellery role and practice setting.  All questions answered.  Discussed provider/patient relationship and expectations.   Past Medical History:  Diagnosis Date   Anemia    Anxiety    Arthritis    Fibroids    GERD (gastroesophageal reflux disease)    Heart murmur    Hemorrhoids    Obesity    Ovarian cyst    Retroperitoneal fibrosis     Past Surgical History:  Procedure Laterality Date   ABDOMINAL HYSTERECTOMY      Family History  Problem Relation Age of Onset   Breast cancer Maternal Aunt    Heart disease Maternal Aunt    Hypertension Mother    Renal Disease Mother    Colon cancer Maternal Uncle    Pancreatic cancer Maternal Grandmother    Cancer Maternal Grandmother        pancreas   Diabetes Maternal Uncle      Social History   Tobacco Use   Smoking status: Never   Smokeless tobacco: Never  Vaping Use   Vaping Use: Never used  Substance Use Topics   Alcohol use: Yes    Comment: occasionally   Drug use: No    Current Outpatient Medications on File Prior to Visit  Medication Sig Dispense Refill   aspirin 81 MG chewable tablet Chew 81 mg by mouth daily.     benzonatate (TESSALON) 100 MG capsule Take 1 capsule (100 mg total) by mouth every 8 (eight) hours. 21 capsule 0   CANNABIDIOL PO Take 500 mg by mouth every other day.     celecoxib (CELEBREX) 200 MG capsule Take 1 capsule (200 mg total) by mouth 2 (two) times daily. 60 capsule 0   cyclobenzaprine (FLEXERIL) 5 MG tablet Take 2 tablets (10 mg total) by mouth 3 (three) times daily as needed for muscle spasms. 15 tablet 0   diclofenac (VOLTAREN) 50 MG EC tablet Take 1  tablet (50 mg total) by mouth 2 (two) times daily. 15 tablet 0   fluconazole (DIFLUCAN) 150 MG tablet Take 1 tablet (150 mg total) by mouth daily. May repeat dose in 3 days if symptoms persist 2 tablet 0   Multiple Vitamin (MULTIVITAMIN WITH MINERALS) TABS tablet Take 1 tablet by mouth daily.     omeprazole (PRILOSEC) 40 MG capsule Take 40 mg by mouth daily.     ondansetron (ZOFRAN ODT) 4 MG disintegrating tablet Take 1 tablet (4 mg total) by mouth every 8 (eight) hours as needed. 10 tablet 0   ondansetron (ZOFRAN) 4 MG tablet Take 1 tablet (4 mg total) by mouth every 6 (six) hours as needed for nausea or vomiting. 12 tablet 0   No current facility-administered medications on file prior to visit.     Review of Systems      Objective:    LMP 06/10/2015 Comment: irreg bleeding, last was on 4/3  Wt Readings from Last 3 Encounters:  06/21/20 280 lb (127 kg)  01/10/20 279 lb (126.6 kg)  01/19/19 270 lb (122.5 kg)    BP Readings from Last 3 Encounters:  04/01/22 138/88  03/26/22 133/72  06/21/20 (!) 168/100    Physical Exam     Assessment & Plan:   Problem List Items Addressed This Visit   None    Follow up plan: No follow-ups on file.

## 2022-04-15 ENCOUNTER — Encounter: Payer: Self-pay | Admitting: Nurse Practitioner

## 2022-04-15 ENCOUNTER — Ambulatory Visit (INDEPENDENT_AMBULATORY_CARE_PROVIDER_SITE_OTHER): Payer: Medicaid Other | Admitting: Nurse Practitioner

## 2022-04-15 VITALS — BP 118/80 | HR 71 | Temp 97.2°F | Ht 64.0 in | Wt 290.2 lb

## 2022-04-15 DIAGNOSIS — G43009 Migraine without aura, not intractable, without status migrainosus: Secondary | ICD-10-CM | POA: Diagnosis not present

## 2022-04-15 DIAGNOSIS — G4733 Obstructive sleep apnea (adult) (pediatric): Secondary | ICD-10-CM | POA: Insufficient documentation

## 2022-04-15 DIAGNOSIS — F339 Major depressive disorder, recurrent, unspecified: Secondary | ICD-10-CM | POA: Diagnosis not present

## 2022-04-15 DIAGNOSIS — K219 Gastro-esophageal reflux disease without esophagitis: Secondary | ICD-10-CM

## 2022-04-15 DIAGNOSIS — R102 Pelvic and perineal pain unspecified side: Secondary | ICD-10-CM

## 2022-04-15 DIAGNOSIS — Z23 Encounter for immunization: Secondary | ICD-10-CM

## 2022-04-15 DIAGNOSIS — M79672 Pain in left foot: Secondary | ICD-10-CM

## 2022-04-15 DIAGNOSIS — M79642 Pain in left hand: Secondary | ICD-10-CM

## 2022-04-15 NOTE — Assessment & Plan Note (Signed)
She has a history of hysterectomy in 2017.  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  For the last 4 months she has also been having severe vaginal pain that lasts about 2 days at a time.  With the last episode she went to urgent care and had a swab done which was negative for BV and yeast.  She was seeing her PCP at the [facility] for this however would like a second opinion by GYN.  Referral placed to GYN today.

## 2022-04-15 NOTE — Assessment & Plan Note (Signed)
She has been experiencing pain in her left hand for the past several months.  She states that the pain is worse when she moves her hand or puts pressure on it.  She does have a history of fracture in this hand.  Will check an x-ray of her left hand today.  She can also continue using Tylenol and ice along with Voltaren gel.  Follow-up in 3 months.

## 2022-04-15 NOTE — Assessment & Plan Note (Signed)
She has been experiencing intermittent left foot pain on the bottom of her foot under her toes.  She states that she did trip this past weekend.  Encouraged her to elevate her foot when sitting, she can also use ice several times a day.  Follow-up if symptoms worsen

## 2022-04-15 NOTE — Assessment & Plan Note (Signed)
Chronic, stable.  Continue CPAP at night.

## 2022-04-15 NOTE — Assessment & Plan Note (Signed)
Chronic, ongoing.  She states that she has tried several medications in the past including Viibryd, Wellbutrin, and another medication that she cannot remember the name of.  She is currently taking a CBD supplement to help with her symptoms.  Her PHQ 9 is a 17 and her GAD-7 is a 12.  She denies SI/HI.  Follow-up in 3 months.

## 2022-04-15 NOTE — Assessment & Plan Note (Signed)
Chronic, stable.  Continue Prilosec 40 mg daily.

## 2022-04-15 NOTE — Assessment & Plan Note (Signed)
Chronic, stable.  She states that she had been having headaches for several years, however they got more frequent at the beginning of the year.  She has been taking acetaminophen which is helping with the headaches.  She also ended up in the emergency room at one point for this and had a CT scan of her head which was negative.  She states that her headaches are getting better.  She does have Zofran as needed for nausea.

## 2022-04-15 NOTE — Patient Instructions (Signed)
It was great to see you!  I have ordered an xray of your hand at:  [facility] address: [address], [place] [address], Ph [phone]  You can use ice, tylenol, voltaren gel as needed.   I have placed a referral to GYN. They will call to schedule.   You will need to get a second shingles vaccine in 2-6 months.   Let's follow-up in 3 months, sooner if you have concerns.  If a referral was placed today, you will be contacted for an appointment. Please note that routine referrals can sometimes take up to 3-4 weeks to process. Please call our office if you haven't heard anything after this time frame.  Take care,  Vance Peper, NP

## 2022-04-16 ENCOUNTER — Ambulatory Visit (INDEPENDENT_AMBULATORY_CARE_PROVIDER_SITE_OTHER): Payer: Medicaid Other

## 2022-04-16 DIAGNOSIS — M79642 Pain in left hand: Secondary | ICD-10-CM

## 2022-04-19 ENCOUNTER — Encounter: Payer: Self-pay | Admitting: Nurse Practitioner

## 2022-04-19 DIAGNOSIS — M79642 Pain in left hand: Secondary | ICD-10-CM

## 2022-05-01 ENCOUNTER — Encounter: Payer: Self-pay | Admitting: Orthopaedic Surgery

## 2022-05-01 ENCOUNTER — Ambulatory Visit (INDEPENDENT_AMBULATORY_CARE_PROVIDER_SITE_OTHER): Payer: Medicaid Other | Admitting: Orthopaedic Surgery

## 2022-05-01 DIAGNOSIS — M79642 Pain in left hand: Secondary | ICD-10-CM | POA: Diagnosis not present

## 2022-05-01 NOTE — Progress Notes (Signed)
Office Visit Note   Patient: Theresa Peterson           Date of Birth: Feb 10, 1966           MRN: [id number] Visit Date: 05/01/2022              Requested by: Charyl Dancer, NP [place],  [place] [id number] PCP: Charyl Dancer, NP   Assessment & Plan: Visit Diagnoses:  1. Left hand pain     Plan: 57 year old female with left hand pain.  Unclear etiology based on findings.  I did review and independently interpreted some left hand x-rays that she had recently which showed a healed third metacarpal fracture as a little bit shortened.  Difficult to say if this could be causing her symptoms.  Offered nerve conduction studies for further investigation or could just monitor the symptoms for now which she elected to do.  I explained that based on my findings I do not feel that this is anything serious.  Follow-Up Instructions: No follow-ups on file.   Orders:  No orders of the defined types were placed in this encounter.  No orders of the defined types were placed in this encounter.     Procedures: No procedures performed   Clinical Data: No additional findings.   Subjective: Chief Complaint  Patient presents with   Left Hand - Pain    HPI  Theresa Peterson is a 57 year old female with 1 month of left hand pain.  Denies any injuries.  States that she fractured her third metacarpal in 2010 which was treated nonoperatively.  She feels pain directly in the center of the palm when grasping or picking up objects.  Denies any numbness and tingling.  She has tried some sort of a wrap over her wrist and hand which helps at night.  Review of Systems  Constitutional: Negative.   HENT: Negative.    Eyes: Negative.   Respiratory: Negative.    Cardiovascular: Negative.   Endocrine: Negative.   Musculoskeletal: Negative.   Neurological: Negative.   Hematological: Negative.   Psychiatric/Behavioral: Negative.    All other systems reviewed and are  negative.    Objective: Vital Signs: LMP 06/10/2015 Comment: irreg bleeding, last was on 4/3  Physical Exam Vitals and nursing note reviewed.  Constitutional:      Appearance: She is well-developed.  HENT:     Head: Atraumatic.     Nose: Nose normal.  Eyes:     Extraocular Movements: Extraocular movements intact.  Cardiovascular:     Pulses: Normal pulses.  Pulmonary:     Effort: Pulmonary effort is normal.  Abdominal:     Palpations: Abdomen is soft.  Musculoskeletal:     Cervical back: Neck supple.  Skin:    General: Skin is warm.     Capillary Refill: Capillary refill takes less than 2 seconds.  Neurological:     Mental Status: She is alert. Mental status is at baseline.  Psychiatric:        Behavior: Behavior normal.        Thought Content: Thought content normal.        Judgment: Judgment normal.     Ortho Exam  Examination of the left hand shows no muscle atrophy or nerve or vascular compromise.  Compression of the carpal tunnel causes numbness and tingling in the long finger.  Otherwise exam is unremarkable.  Specialty Comments:  No specialty comments available.  Imaging: No results found.  PMFS History: Patient Active Problem List   Diagnosis Date Noted   Vaginal pain 04/15/2022   Left hand pain 04/15/2022   Left foot pain 04/15/2022   Migraine without aura and without status migrainosus, not intractable 04/15/2022   OSA (obstructive sleep apnea) 04/15/2022   Depression, recurrent ([place]) 04/15/2022   Atypical chest pain 02/08/2013   GERD (gastroesophageal reflux disease) 10/18/2010   Past Medical History:  Diagnosis Date   Allergy 1992   chronic rhinitis and sinusitis   Anemia    Anxiety    Arthritis    Cataract 2000   mild   Depression 1994   Fibroids    GERD (gastroesophageal reflux disease)    Heart murmur    Hemorrhoids    Obesity    Ovarian cyst    Retroperitoneal fibrosis    Sleep apnea 2018    Family History  Problem  Relation Age of Onset   Breast cancer Maternal Aunt    Heart disease Maternal Aunt    Hypertension Mother    Renal Disease Mother    Cancer Mother    Cancer Father    Colon cancer Maternal Uncle    Pancreatic cancer Maternal Grandmother    Cancer Maternal Grandmother        pancreas   Diabetes Maternal Uncle     Past Surgical History:  Procedure Laterality Date   ABDOMINAL HYSTERECTOMY     ABDOMINAL SURGERY     adhesions   Social History   Occupational History   Occupation: Caregiver  Tobacco Use   Smoking status: Never   Smokeless tobacco: Never  Vaping Use   Vaping Use: Never used  Substance and Sexual Activity   Alcohol use: Yes    Alcohol/week: 1.0 standard drink of alcohol    Types: 1 Glasses of wine per week    Comment: one to two times a month   Drug use: No   Sexual activity: Not Currently    Birth control/protection: Abstinence, None

## 2022-05-17 ENCOUNTER — Encounter (HOSPITAL_COMMUNITY): Payer: Self-pay | Admitting: Emergency Medicine

## 2022-05-17 ENCOUNTER — Emergency Department (HOSPITAL_COMMUNITY): Payer: Medicaid Other

## 2022-05-17 ENCOUNTER — Emergency Department (HOSPITAL_COMMUNITY)
Admission: EM | Admit: 2022-05-17 | Discharge: 2022-05-17 | Disposition: A | Discharge status: Home / Self Care | Payer: Medicaid Other | Attending: Emergency Medicine | Admitting: Emergency Medicine

## 2022-05-17 ENCOUNTER — Other Ambulatory Visit: Payer: Self-pay

## 2022-05-17 DIAGNOSIS — R0789 Other chest pain: Secondary | ICD-10-CM | POA: Insufficient documentation

## 2022-05-17 DIAGNOSIS — Z79899 Other long term (current) drug therapy: Secondary | ICD-10-CM | POA: Diagnosis not present

## 2022-05-17 DIAGNOSIS — I10 Essential (primary) hypertension: Secondary | ICD-10-CM | POA: Insufficient documentation

## 2022-05-17 DIAGNOSIS — R0781 Pleurodynia: Secondary | ICD-10-CM | POA: Insufficient documentation

## 2022-05-17 LAB — CBC
HCT: 35.1 % — ABNORMAL LOW (ref 36.0–46.0)
Hemoglobin: 10.8 g/dL — ABNORMAL LOW (ref 12.0–15.0)
MCH: 26.2 pg (ref 26.0–34.0)
MCHC: 30.8 g/dL (ref 30.0–36.0)
MCV: 85.2 fL (ref 80.0–100.0)
Platelets: 247 10*3/uL (ref 150–400)
RBC: 4.12 MIL/uL (ref 3.87–5.11)
RDW: 14.6 % (ref 11.5–15.5)
WBC: 5.4 10*3/uL (ref 4.0–10.5)
nRBC: 0 % (ref 0.0–0.2)

## 2022-05-17 LAB — BASIC METABOLIC PANEL
Anion gap: 7 (ref 5–15)
BUN: 12 mg/dL (ref 6–20)
CO2: 25 mmol/L (ref 22–32)
Calcium: 9.7 mg/dL (ref 8.9–10.3)
Chloride: 106 mmol/L (ref 98–111)
Creatinine, Ser: 0.9 mg/dL (ref 0.44–1.00)
GFR, Estimated: >60 mL/min (ref >60)
Glucose, Bld: 101 mg/dL — ABNORMAL HIGH (ref 70–99)
Potassium: 4 mmol/L (ref 3.5–5.1)
Sodium: 138 mmol/L (ref 135–145)

## 2022-05-17 LAB — TROPONIN I (HIGH SENSITIVITY): Troponin I (High Sensitivity): 6 ng/L (ref <18)

## 2022-05-17 MED ORDER — IBUPROFEN 800 MG PO TABS
800.0000 mg | ORAL_TABLET | Freq: Once | ORAL | Status: DC
  Filled 2022-05-17: qty 1

## 2022-05-17 NOTE — ED Provider Notes (Signed)
[place] EMERGENCY DEPARTMENT AT [facility] Provider Note   CSN: [id number] Arrival date & time: 05/17/22  [id number]     History  Chief Complaint  Patient presents with   Rib Cage Pain    Theresa Peterson is a 57 y.o. female with past medical history significant for acid reflux, anxiety, anemia, obesity with no previous history of hypertension, hyperlipidemia who presents with left-sided chest pinching sensation versus chest pressure for the last week.  Patient denies worse with exertion, she denies nausea, vomiting, radiation to arm, back.  She denies shortness of breath.  Patient reports that she has had a history of some abnormal EKGs in the past and so gets somewhat frequent stress test, echoes.  She reports most recent was in November, she reports some abnormality but cannot tell me what.  She has not had a heart cath before.  HPI     Home Medications Prior to Admission medications   Medication Sig Start Date End Date Taking? Authorizing Provider  acetaminophen (TYLENOL) 500 MG tablet  03/04/22   [provider]  albuterol (VENTOLIN HFA) 108 (90 Base) MCG/ACT inhaler INHALE 2 PUFFS BY MOUTH FOUR TIMES A DAY AS NEEDED FOR SHORTNESS OF BREATH 07/03/21   [provider]  camphor-menthol (SARNA) lotion APPLY SMALL AMOUNT TO AFFECTED AREA TWICE A DAY TO TOP OF LEFT FOOT REGION 12/17/21   [provider]  CANNABIDIOL PO Take 500 mg by mouth every other day.    [provider]  Cholecalciferol 50 MCG (2000 UT) TABS TAKE ONE TABLET BY MOUTH DAILY FOR BONE HEALTH 07/01/21   [provider]  Multiple Vitamin (MULTIVITAMIN WITH MINERALS) TABS tablet Take 1 tablet by mouth daily.    [provider]  omeprazole (PRILOSEC) 40 MG capsule Take 40 mg by mouth daily.    [provider]  ondansetron (ZOFRAN) 4 MG tablet Take 1 tablet (4 mg total) by mouth every 6 (six) hours as needed for nausea or vomiting. 04/01/22   Azucena Cecil, PA-C  vitamin B-12 (CYANOCOBALAMIN) 100 MCG tablet Take 100 mcg by mouth daily.    [provider]      Allergies    Shellfish allergy, Avocado, Flagyl [metronidazole hcl], Metronidazole, and Prednisone    Review of Systems   Review of Systems  Respiratory:  Positive for chest tightness.   Cardiovascular:  Positive for chest pain.  All other systems reviewed and are negative.   Physical Exam Updated Vital Signs BP (!) 140/101 (BP Location: Right Arm)   Pulse 65   Temp 98.3 F (36.8 C) (Oral)   Resp 18   Ht 5\' 4"  (1.626 m)   Wt 131.5 kg   LMP 06/10/2015 Comment: irreg bleeding, last was on 4/3  SpO2 97%   BMI 49.78 kg/m  Physical Exam Vitals and nursing note reviewed.  Constitutional:      General: She is not in acute distress.    Appearance: Normal appearance.  HENT:     Head: Normocephalic and atraumatic.  Eyes:     General:        Right eye: No discharge.        Left eye: No discharge.  Cardiovascular:     Rate and Rhythm: Normal rate and regular rhythm.     Heart sounds: No murmur heard.    No friction rub. No gallop.     Comments: Mild tenderness to palpation of left sided chest wall without stepoff, deformity or overlying  skin changes Pulmonary:     Effort: Pulmonary effort is normal.     Breath sounds: Normal breath sounds.  Abdominal:     General: Bowel sounds are normal.     Palpations: Abdomen is soft.  Skin:    General: Skin is warm and dry.     Capillary Refill: Capillary refill takes less than 2 seconds.  Neurological:     Mental Status: She is alert and oriented to person, place, and time.  Psychiatric:        Mood and Affect: Mood normal.        Behavior: Behavior normal.     ED Results / Procedures / Treatments   Labs (all labs ordered are listed, but only abnormal results are displayed) Labs Reviewed  CBC - Abnormal; Notable for the following components:      Result Value   Hemoglobin 10.8 (*)    HCT 35.1 (*)     All other components within normal limits  BASIC METABOLIC PANEL - Abnormal; Notable for the following components:   Glucose, Bld 101 (*)    All other components within normal limits  TROPONIN I (HIGH SENSITIVITY)    EKG EKG Interpretation  Date/Time:  Friday May 17 2022 08:19:57 EDT Ventricular Rate:  78 PR Interval:  162 QRS Duration: 76 QT Interval:  402 QTC Calculation: 458 R Axis:   96 Text Interpretation: Sinus rhythm Borderline right axis deviation Borderline repolarization abnormality Baseline wander in lead(s) V3 No significant change since last tracing Confirmed by Gareth Morgan [phone]) on 05/17/2022 8:27:36 AM  Radiology DG Ribs Unilateral W/Chest Left  Result Date: 05/17/2022 CLINICAL DATA:  LEFT rib cage pain since Sunday, rib injury EXAM: LEFT RIBS AND CHEST - 3+ VIEW COMPARISON:  Chest radiograph 06/21/2020 FINDINGS: Enlargement of cardiac silhouette. Mediastinal contours and pulmonary vascularity normal. Minimal linear scarring in lingula. Lungs otherwise clear. No pulmonary infiltrate, pleural effusion, or pneumothorax. BB placed at site of symptoms lower LEFT ribs. No definite rib fracture or bone destruction. IMPRESSION: No definite acute abnormalities. Electronically Signed   By: Lavonia Dana M.D.   On: 05/17/2022 08:50    Procedures Procedures    Medications Ordered in ED Medications  ibuprofen (ADVIL) tablet 800 mg (800 mg Oral Not Given 05/17/22 0913)    ED Course/ Medical Decision Making/ A&P                             Medical Decision Making Amount and/or Complexity of Data Reviewed Labs: ordered. Radiology: ordered.  Risk Prescription drug management.   This patient is a 57 y.o. female  who presents to the ED for concern of chest pain.   Differential diagnoses prior to evaluation: The emergent differential diagnosis includes, but is not limited to,  ACS, AAS, PE, Mallory-Weiss, Boerhaave's, Pneumonia, acute bronchitis, asthma or COPD  exacerbation, anxiety, MSK pain or traumatic injury to the chest, acid reflux versus other . This is not an exhaustive differential.   Past Medical History / Co-morbidities: Anxiety, acid reflux, anemia, per patient she endorses some history of CAD but no documented record in our chart, she reports that she gets stress test, echo at the [place], I cannot see these results  Additional history: Chart reviewed. Pertinent results include: Reviewed lab work, imaging from previous emergency department visits  Physical Exam: Physical exam performed. The pertinent findings include: Patient with some tenderness to the left side of the chest wall with no  rebound, rigidity, guarding.  She has normal heart and lung sounds on my exam.  She is somewhat hypertensive with diastolic of [id number] on arrival, but she is not tachycardic, she is stable oxygen saturation on room air.  She has no accessory breath sounds.  Lab Tests/Imaging studies: I personally interpreted labs/imaging and the pertinent results include: CBC with mild anemia, hemoglobin 10.8, otherwise unremarkable, BMP unremarkable, troponin negative x 1 in context of chest pain/chest pinching sensation going on for greater than 1 week..  Independently interpreted plain film chest x-ray with left ribs, which showed no evidence of acute fracture, dislocation, or other intrathoracic abnormality.  I agree with the radiologist interpretation.  Cardiac monitoring: EKG obtained and interpreted by my attending physician which shows: Normal sinus rhythm, no significant change from baseline.   Medications: I ordered medication including ibuprofen 800 x 1.  I have reviewed the patients home medicines and have made adjustments as needed.   Disposition: After consideration of the diagnostic results and the patients response to treatment, I feel that patient's pain is consistent with likely musculoskeletal pain, her story is not typical for ischemic chest pain, she has close  follow-up with cardiology, considered admission for monitoring or repeat troponin but I think that based on her presentation today, no active chest pain at time of her discharge she is stable at this time.   emergency department workup does not suggest an emergent condition requiring admission or immediate intervention beyond what has been performed at this time. The plan is: as above. The patient is safe for discharge and has been instructed to return immediately for worsening symptoms, change in symptoms or any other concerns.  Final Clinical Impression(s) / ED Diagnoses Final diagnoses:  Chest wall pain  Atypical chest pain    Rx / DC Orders ED Discharge Orders     None         Dorien Chihuahua 05/17/22 1022    Gareth Morgan, MD 05/19/22 [phone]

## 2022-05-17 NOTE — ED Triage Notes (Signed)
Patient arrives ambulatory by POV c/o left sided rib cage pain since Sunday. Patient describes this as a pinching sensation. Pain is non radiating. Denies any shortness of breath or chest pain.

## 2022-05-17 NOTE — Discharge Instructions (Signed)
Please use Tylenol or ibuprofen for pain.  You may use 600 mg ibuprofen every 6 hours or 1000 mg of Tylenol every 6 hours.  You may choose to alternate between the 2.  This would be most effective.  Not to exceed 4 g of Tylenol within 24 hours.  Not to exceed 3200 mg ibuprofen 24 hours.  

## 2022-05-22 ENCOUNTER — Encounter: Payer: Self-pay | Admitting: Neurology

## 2022-06-11 ENCOUNTER — Ambulatory Visit: Payer: Medicaid Other | Admitting: Obstetrics and Gynecology

## 2022-07-04 NOTE — Progress Notes (Addendum)
NEUROLOGY CONSULTATION NOTE  Blue Dalby MRN: [id number] DOB: October 22, 1965  Referring provider: Bernerd Pho, MD Primary care provider: Rodman Pickle, NP  Reason for consult:  tremor  Assessment/Plan:   Task-specific tremor/dystonia of left hand.  As long as it is not interfering with her daily routine activities, I would defer treatment.  She says she can still type, just takes some breaks.  If it worsens where she may require treatment, she will follow up and we can consider: Propranolol Primidone Referral to specialist for evaluation and potential treatment with Botox Otherwise, follow up as needed.  Subjective:  Theresa Peterson is a 57 year old right-handed female who presents for tremor.  She fell and fractured her left hand in 2010.  Pain subsequently resolved.  About 5 years ago, she began experiencing a tremor in her left whenever she would use the keyboard to type.  More noticeable over the past year.  If she presses her left thumb into the keyboard, it may diminish it.  No pain, cramping, weakness or numbness.  It does not occur with any other action.  Does not involve right hand.  She is right-handed, so she does not write or use utensils with her right hand but does not have tremor in the left hand when using fork and knife.  She began experiencing pain in the left hand again in February.  Saw ortho hand X-ray - small soft tissue foreign body along base of the proximal phalanx of fourth left finger but no acute osseous abnormality.   Her mother had tremor in her 82s and 64s.      PAST MEDICAL HISTORY: Past Medical History:  Diagnosis Date   Allergy 1992   chronic rhinitis and sinusitis   Anemia    Anxiety    Arthritis    Cataract 2000   mild   Depression 1994   Fibroids    GERD (gastroesophageal reflux disease)    Heart murmur    Hemorrhoids    Obesity    Ovarian cyst    Retroperitoneal fibrosis    Sleep apnea 2018    PAST SURGICAL  HISTORY: Past Surgical History:  Procedure Laterality Date   ABDOMINAL HYSTERECTOMY     ABDOMINAL SURGERY     adhesions    MEDICATIONS: Current Outpatient Medications on File Prior to Visit  Medication Sig Dispense Refill   acetaminophen (TYLENOL) 500 MG tablet      albuterol (VENTOLIN HFA) 108 (90 Base) MCG/ACT inhaler INHALE 2 PUFFS BY MOUTH FOUR TIMES A DAY AS NEEDED FOR SHORTNESS OF BREATH     camphor-menthol (SARNA) lotion APPLY SMALL AMOUNT TO AFFECTED AREA TWICE A DAY TO TOP OF LEFT FOOT REGION     CANNABIDIOL PO Take 500 mg by mouth every other day.     Cholecalciferol 50 MCG (2000 UT) TABS TAKE ONE TABLET BY MOUTH DAILY FOR BONE HEALTH     Multiple Vitamin (MULTIVITAMIN WITH MINERALS) TABS tablet Take 1 tablet by mouth daily.     omeprazole (PRILOSEC) 40 MG capsule Take 40 mg by mouth daily.     ondansetron (ZOFRAN) 4 MG tablet Take 1 tablet (4 mg total) by mouth every 6 (six) hours as needed for nausea or vomiting. 12 tablet 0   vitamin B-12 (CYANOCOBALAMIN) 100 MCG tablet Take 100 mcg by mouth daily.     No current facility-administered medications on file prior to visit.    ALLERGIES: Allergies  Allergen Reactions   Shellfish Allergy Anaphylaxis  Avocado Itching    Itching in mouth   Flagyl [Metronidazole Hcl] Nausea And Vomiting   Metronidazole Nausea And Vomiting and Nausea Only   Prednisone Other (See Comments)    dizziness    FAMILY HISTORY: Family History  Problem Relation Age of Onset   Breast cancer Maternal Aunt    Heart disease Maternal Aunt    Hypertension Mother    Renal Disease Mother    Cancer Mother    Cancer Father    Colon cancer Maternal Uncle    Pancreatic cancer Maternal Grandmother    Cancer Maternal Grandmother        pancreas   Diabetes Maternal Uncle     Objective:  Blood pressure (!) 147/71, pulse 78, height 5\' 4"  (1.626 m), weight 297 lb (134.7 kg), last menstrual period 06/10/2015, SpO2 98 %. General: No acute distress.   Patient appears well-groomed.   Head:  Normocephalic/atraumatic Eyes:  fundi examined but not visualized Neck: supple, no paraspinal tenderness, full range of motion Back: No paraspinal tenderness Heart: regular rate and rhythm Neurological Exam: Mental status: alert and oriented to person, place, and time, speech fluent and not dysarthric, language intact. Cranial nerves: CN I: not tested CN II: pupils equal, round and reactive to light, visual fields intact CN III, IV, VI:  full range of motion, no nystagmus, no ptosis CN V: facial sensation intact. CN VII: upper and lower face symmetric CN VIII: hearing intact CN IX, X: gag intact, uvula midline CN XI: sternocleidomastoid and trapezius muscles intact CN XII: tongue midline Bulk & Tone: normal, no fasciculations.  No rigidity. Motor:  muscle strength 5/5 throughout.  No bradykinesia.  She simulated typing and her left hand started to tremor.   Sensation:  Pinprick and vibratory sensation intact. Deep Tendon Reflexes:  2+ throughout,  toes downgoing.   Finger to nose testing:  Without dysmetria.   Heel to shin:  Without dysmetria.   Gait:  Normal station and stride.  Romberg negative.    Thank you for allowing me to take part in the care of this patient.  Shon Millet, DO  CC:  Rodman Pickle, NP  Kandee Keen, MD

## 2022-07-05 ENCOUNTER — Ambulatory Visit: Payer: No Typology Code available for payment source | Admitting: Neurology

## 2022-07-05 ENCOUNTER — Encounter: Payer: Self-pay | Admitting: Neurology

## 2022-07-05 VITALS — BP 147/71 | HR 78 | Ht 64.0 in | Wt 297.0 lb

## 2022-07-05 DIAGNOSIS — G249 Dystonia, unspecified: Secondary | ICD-10-CM

## 2022-07-05 DIAGNOSIS — R251 Tremor, unspecified: Secondary | ICD-10-CM | POA: Diagnosis not present

## 2022-07-05 NOTE — Patient Instructions (Signed)
I think you have "task specific tremor".  It is benign.  As long as it is not interfering with your daily routine, I would just monitor.  If it requires treatment, we can consider: taking propranolol or primidone.  Or I can refer you to somebody to see if you may benefit from Botox (which may be helpful).

## 2022-07-15 ENCOUNTER — Ambulatory Visit: Payer: No Typology Code available for payment source | Admitting: Nurse Practitioner

## 2022-07-23 ENCOUNTER — Ambulatory Visit: Payer: Medicaid Other | Admitting: Obstetrics and Gynecology

## 2023-03-19 ENCOUNTER — Other Ambulatory Visit: Payer: Self-pay

## 2023-03-19 ENCOUNTER — Encounter (HOSPITAL_COMMUNITY): Payer: Self-pay

## 2023-03-19 ENCOUNTER — Emergency Department (HOSPITAL_COMMUNITY)
Admission: EM | Admit: 2023-03-19 | Discharge: 2023-03-19 | Disposition: A | Discharge status: Home / Self Care | Payer: Non-veteran care | Attending: Emergency Medicine | Admitting: Emergency Medicine

## 2023-03-19 DIAGNOSIS — R109 Unspecified abdominal pain: Secondary | ICD-10-CM | POA: Insufficient documentation

## 2023-03-19 DIAGNOSIS — Z20822 Contact with and (suspected) exposure to covid-19: Secondary | ICD-10-CM | POA: Insufficient documentation

## 2023-03-19 DIAGNOSIS — R030 Elevated blood-pressure reading, without diagnosis of hypertension: Secondary | ICD-10-CM | POA: Insufficient documentation

## 2023-03-19 DIAGNOSIS — K219 Gastro-esophageal reflux disease without esophagitis: Secondary | ICD-10-CM | POA: Diagnosis not present

## 2023-03-19 LAB — CBC WITH DIFFERENTIAL/PLATELET
Abs Immature Granulocytes: 0.03 10*3/uL (ref 0.00–0.07)
Basophils Absolute: 0 10*3/uL (ref 0.0–0.1)
Basophils Relative: 0 %
Eosinophils Absolute: 0.1 10*3/uL (ref 0.0–0.5)
Eosinophils Relative: 2 %
HCT: 37.5 % (ref 36.0–46.0)
Hemoglobin: 11.4 g/dL — ABNORMAL LOW (ref 12.0–15.0)
Immature Granulocytes: 1 %
Lymphocytes Relative: 29 %
Lymphs Abs: 1.9 10*3/uL (ref 0.7–4.0)
MCH: 25.6 pg — ABNORMAL LOW (ref 26.0–34.0)
MCHC: 30.4 g/dL (ref 30.0–36.0)
MCV: 84.1 fL (ref 80.0–100.0)
Monocytes Absolute: 0.3 10*3/uL (ref 0.1–1.0)
Monocytes Relative: 5 %
Neutro Abs: 4.2 10*3/uL (ref 1.7–7.7)
Neutrophils Relative %: 63 %
Platelets: 268 10*3/uL (ref 150–400)
RBC: 4.46 MIL/uL (ref 3.87–5.11)
RDW: 14.8 % (ref 11.5–15.5)
WBC: 6.5 10*3/uL (ref 4.0–10.5)
nRBC: 0 % (ref 0.0–0.2)

## 2023-03-19 LAB — URINALYSIS, ROUTINE W REFLEX MICROSCOPIC
Bilirubin Urine: NEGATIVE
Glucose, UA: NEGATIVE mg/dL
Hgb urine dipstick: NEGATIVE
Ketones, ur: NEGATIVE mg/dL
Leukocytes,Ua: NEGATIVE
Nitrite: NEGATIVE
Protein, ur: NEGATIVE mg/dL
Specific Gravity, Urine: 1.01 (ref 1.005–1.030)
pH: 5 (ref 5.0–8.0)

## 2023-03-19 LAB — COMPREHENSIVE METABOLIC PANEL
ALT: 15 U/L (ref 0–44)
AST: 16 U/L (ref 15–41)
Albumin: 4 g/dL (ref 3.5–5.0)
Alkaline Phosphatase: 92 U/L (ref 38–126)
Anion gap: 9 (ref 5–15)
BUN: 12 mg/dL (ref 6–20)
CO2: 24 mmol/L (ref 22–32)
Calcium: 10 mg/dL (ref 8.9–10.3)
Chloride: 104 mmol/L (ref 98–111)
Creatinine, Ser: 0.94 mg/dL (ref 0.44–1.00)
GFR, Estimated: >60 mL/min (ref >60)
Glucose, Bld: 92 mg/dL (ref 70–99)
Potassium: 3.7 mmol/L (ref 3.5–5.1)
Sodium: 137 mmol/L (ref 135–145)
Total Bilirubin: 0.5 mg/dL (ref 0.0–1.2)
Total Protein: 7.7 g/dL (ref 6.5–8.1)

## 2023-03-19 LAB — LIPASE, BLOOD: Lipase: 33 U/L (ref 11–51)

## 2023-03-19 LAB — RESP PANEL BY RT-PCR (RSV, FLU A&B, COVID)  RVPGX2
Influenza A by PCR: NEGATIVE
Influenza B by PCR: NEGATIVE
Resp Syncytial Virus by PCR: NEGATIVE
SARS Coronavirus 2 by RT PCR: NEGATIVE

## 2023-03-19 NOTE — ED Triage Notes (Signed)
 C/o right sided flank pain with chills that is worse with movement x1 day.  Denies hematuria/ urinary s/sy

## 2023-03-19 NOTE — ED Provider Notes (Signed)
[place] EMERGENCY DEPARTMENT AT [facility] Provider Note   CSN: [id number] Arrival date & time: 03/19/23  1139     History  Chief Complaint  Patient presents with   Flank Pain    Theresa Peterson is a 58 y.o. female.  Patient with history of GERD, migraine --presents to the emergency department today for evaluation of right-sided flank pain.  Patient states that yesterday she turned in an office chair and went to stand up.  She had pain described as sharp and burning in the right side.  After few seconds she was able to stand up and walk to her couch and sit down.  Since that time the pain has been intermittent.  It is reproducible with certain positions.  It is also tender to palpation.  When the pain hits her she feels like she cannot control her upper body movement.  No chest pain or shortness of breath.  No cough or hemoptysis.  No lower extremity swelling or pain.  She denies irritative UTI symptoms including dysuria, increased pregnancy urgency.  No hematuria noted.       Home Medications Prior to Admission medications   Medication Sig Start Date End Date Taking? Authorizing Provider  acetaminophen (TYLENOL) 500 MG tablet  03/04/22   [provider]  albuterol (VENTOLIN HFA) 108 (90 Base) MCG/ACT inhaler INHALE 2 PUFFS BY MOUTH FOUR TIMES A DAY AS NEEDED FOR SHORTNESS OF BREATH 07/03/21   [provider]  camphor-menthol (SARNA) lotion APPLY SMALL AMOUNT TO AFFECTED AREA TWICE A DAY TO TOP OF LEFT FOOT REGION 12/17/21   [provider]  CANNABIDIOL PO Take 500 mg by mouth every other day.    [provider]  Cholecalciferol 50 MCG (2000 UT) TABS TAKE ONE TABLET BY MOUTH DAILY FOR BONE HEALTH 07/01/21   [provider]  Multiple Vitamin (MULTIVITAMIN WITH MINERALS) TABS tablet Take 1 tablet by mouth daily.    [provider]  omeprazole (PRILOSEC) 40 MG capsule Take 40 mg by mouth daily.    [provider]  vitamin B-12 (CYANOCOBALAMIN) 100 MCG tablet Take 100 mcg by mouth daily.    [provider]      Allergies    Shellfish allergy, Avocado, Flagyl [metronidazole hcl], Metronidazole, Prednisone, and Sulfamethoxazole-trimethoprim    Review of Systems   Review of Systems  Physical Exam Updated Vital Signs BP (!) 180/107   Pulse 98   Temp 98.6 F (37 C)   Resp 18   Ht 5\' 4"  (1.626 m)   Wt 134 kg   LMP 06/10/2015 Comment: irreg bleeding, last was on 4/3  SpO2 98%   BMI 50.71 kg/m  Physical Exam Vitals and nursing note reviewed.  Constitutional:      General: She is not in acute distress.    Appearance: She is well-developed.  HENT:     Head: Normocephalic and atraumatic.     Right Ear: External ear normal.     Left Ear: External ear normal.     Nose: Nose normal.  Eyes:     Conjunctiva/sclera: Conjunctivae normal.  Cardiovascular:     Rate and Rhythm: Normal rate and regular rhythm.     Heart sounds: No murmur heard. Pulmonary:     Effort: No respiratory distress.     Breath sounds: No wheezing, rhonchi or rales.  Abdominal:     Palpations: Abdomen is soft.     Tenderness: There is no abdominal tenderness. There is no  guarding or rebound.  Musculoskeletal:     Cervical back: Normal range of motion and neck supple.     Right lower leg: No edema.     Left lower leg: No edema.     Comments: Patient tender to palpation over the right flank, slightly posterior.  Patient winces when I palpate in this area.  No skin changes or rashes.  Skin:    General: Skin is warm and dry.     Findings: No rash.  Neurological:     General: No focal deficit present.     Mental Status: She is alert. Mental status is at baseline.     Motor: No weakness.  Psychiatric:        Mood and Affect: Mood normal.     ED Results / Procedures / Treatments   Labs (all labs ordered are listed, but only abnormal results are displayed) Labs Reviewed  CBC WITH DIFFERENTIAL/PLATELET -  Abnormal; Notable for the following components:      Result Value   Hemoglobin 11.4 (*)    MCH 25.6 (*)    All other components within normal limits  URINALYSIS, ROUTINE W REFLEX MICROSCOPIC - Abnormal; Notable for the following components:   Color, Urine STRAW (*)    APPearance HAZY (*)    All other components within normal limits  RESP PANEL BY RT-PCR (RSV, FLU A&B, COVID)  [id number]  COMPREHENSIVE METABOLIC PANEL  LIPASE, BLOOD    EKG EKG Interpretation Date/Time:  Wednesday March 19 2023 12:16:43 EST Ventricular Rate:  95 PR Interval:  168 QRS Duration:  76 QT Interval:  382 QTC Calculation: 481 R Axis:   95  Text Interpretation: Sinus rhythm Consider right atrial enlargement Borderline right axis deviation Borderline repolarization abnormality Confirmed by Beckey Downing [phone]) on 03/19/2023 12:19:59 PM  Radiology No results found.  Procedures Procedures    Medications Ordered in ED Medications - No data to display  ED Course/ Medical Decision Making/ A&P    Patient seen and examined. History obtained directly from patient. Work-up including labs, imaging, EKG ordered in triage, if performed, were reviewed.    Labs/EKG: Independently reviewed and interpreted.  This included: CBC with normal white blood cell count, mild anemia with hemoglobin 11.4 but appears stable compared to previous; CMP unremarkable with normal electrolytes, kidney and liver function testing; lipase normal; UA without signs of infection or bleeding.  EKG also reviewed, agree with interpretation as above, no obvious signs of ischemia.  Imaging: None ordered.  Considered renal stone CT, however history and physical exam suggestive of musculoskeletal cause.  Medications/Fluids: Offered IM Toradol, lidocaine patch.  Patient states that she has medications at home that she can use.  She does not want injection here.  Most recent vital signs reviewed and are as follows: BP (!) 180/107   Pulse 98    Temp 98.6 F (37 C)   Resp 18   Ht 5\' 4"  (1.626 m)   Wt 134 kg   LMP 06/10/2015 Comment: irreg bleeding, last was on 4/3  SpO2 98%   BMI 50.71 kg/m   Initial impression: Right-sided flank pain, seems musculoskeletal in nature given reproducibility and positional nature also started with awkward positioning with standing after turning in an office chair.  Home treatment plan: Rest, OTC meds, ice/heat  Return instructions discussed with patient: The patient was urged to return to the Emergency Department immediately with worsening of current symptoms, worsening abdominal pain, persistent vomiting, blood noted in stools, fever, or any  other concerns.  Also she develops chest pain or shortness of breath.  The patient verbalized understanding.   Follow-up instructions discussed with patient: PCP the next 3 days for recheck of symptoms.                                Medical Decision Making  For this patient's complaint of flank pain, the following emergent conditions were considered on the differential diagnosis: acute coronary syndrome, pulmonary embolism, pneumothorax, myocarditis, pericardial tamponade, aortic dissection, thoracic aortic aneurysm complication, esophageal perforation.   Other causes were also considered including: gastroesophageal reflux disease, musculoskeletal pain including costochondritis, pneumonia/pleurisy, herpes zoster, pericarditis.  In regards to possibility of ACS, patient has atypical features of pain, non-ischemic EKG. location of pain and history not suggestive of ACS.  In regards to possibility of PE, symptoms are atypical for PE and risk profile is low, location of pain and history not suggestive of PE.  For this patient's complaint of flank pain, the following conditions were considered on the differential diagnosis: gastritis/PUD, enteritis/duodenitis, appendicitis, cholelithiasis/cholecystitis, cholangitis, pancreatitis, ruptured viscus, colitis,  diverticulitis, small/large bowel obstruction, proctitis, cystitis, pyelonephritis, ureteral colic, aortic dissection, aortic aneurysm. In women, ectopic pregnancy, pelvic inflammatory disease, ovarian cysts, and tubo-ovarian abscess were also considered. Atypical chest etiologies were also considered including ACS, PE, and pneumonia.  The patient's vital signs, pertinent lab work and imaging were reviewed and interpreted as discussed in the ED course. Hospitalization was considered for further testing, treatments, or serial exams/observation. However as patient is well-appearing, has a stable exam, and reassuring studies today, I do not feel that they warrant admission at this time. This plan was discussed with the patient who verbalizes agreement and comfort with this plan and seems reliable and able to return to the Emergency Department with worsening or changing symptoms.           Final Clinical Impression(s) / ED Diagnoses Final diagnoses:  Right flank pain  Elevated blood pressure reading    Rx / DC Orders ED Discharge Orders     None         Renne Crigler, PA-C 03/19/23 1650    Glyn Ade, MD 03/20/23 1629

## 2023-03-19 NOTE — Discharge Instructions (Signed)
 Please read and follow all provided instructions.  Your diagnoses today include:  1. Right flank pain   2. Elevated blood pressure reading     Tests performed today include: Blood cell counts and platelets: slightly low hemoglobin but stable Kidney and liver function tests Pancreas function test (called lipase) Urine test to look for infection: was negative EKG: does not show signs of stress on the heart Vital signs. See below for your results today.   Medications prescribed:  None  Take any prescribed medications only as directed.  Home care instructions:  Follow any educational materials contained in this packet.  Follow-up instructions: Please follow-up with your primary care provider in the next 3 days for further evaluation of your symptoms.  You should also have your blood pressure rechecked as it was high today.   Return instructions:  SEEK IMMEDIATE MEDICAL ATTENTION IF: The pain does not go away or becomes severe  A temperature above 101F develops  Repeated vomiting occurs (multiple episodes)  The pain becomes localized to portions of the abdomen. The right side could possibly be appendicitis. In an adult, the left lower portion of the abdomen could be colitis or diverticulitis.  Blood is being passed in stools or vomit (bright red or black tarry stools)  You develop chest pain, difficulty breathing, dizziness or fainting, or become confused, poorly responsive, or inconsolable (young children) If you have any other emergent concerns regarding your health  Additional Information: Abdominal (belly) pain can be caused by many things. Your caregiver performed an examination and possibly ordered blood/urine tests and imaging (CT scan, x-rays, ultrasound). Many cases can be observed and treated at home after initial evaluation in the emergency department. Even though you are being discharged home, abdominal pain can be unpredictable. Therefore, you need a repeated exam if your  pain does not resolve, returns, or worsens. Most patients with abdominal pain don't have to be admitted to the hospital or have surgery, but serious problems like appendicitis and gallbladder attacks can start out as nonspecific pain. Many abdominal conditions cannot be diagnosed in one visit, so follow-up evaluations are very important.  Your vital signs today were: BP (!) 180/107   Pulse 98   Temp 98.6 F (37 C)   Resp 18   Ht 5\' 4"  (1.626 m)   Wt 134 kg   LMP 06/10/2015 Comment: irreg bleeding, last was on 4/3  SpO2 98%   BMI 50.71 kg/m  If your blood pressure (bp) was elevated above 135/85 this visit, please have this repeated by your doctor within one month. --------------

## 2023-03-19 NOTE — ED Provider Triage Note (Signed)
Emergency Medicine Provider Triage Evaluation Note  Theresa Peterson , a 58 y.o. female  was evaluated in triage.  Pt complains of 1 day Hx of R sided flank pain radiating to groin and R upper quadrant of abdomen that began acutely but today has subsided. Pain exacerbated w/ movement. Described as a "burning pain." Endorses sick contacts at work. Denies trauma.  Hx of atypical chest pain.  Denies Hx of kidney stones.   Review of Systems  Positive: Subjective fevers, chills, cough, congestion,  mild dyspnea on exertion.  Negative: Chest pain, n/v/d, hematuria, dysuria, LE edema  Physical Exam  BP (!) 180/107   Pulse 98   Temp 98.6 F (37 C)   Resp 18   Ht 5\' 4"  (1.626 m)   Wt 134 kg   LMP 06/10/2015 Comment: irreg bleeding, last was on 4/3  SpO2 98%   BMI 50.71 kg/m  Gen:   Awake, no distress   Resp:  Normal effort  MSK:   Moves extremities without difficulty  Other:    Medical Decision Making  Medically screening exam initiated at 12:03 PM.  Appropriate orders placed.  Theresa Peterson was informed that the remainder of the evaluation will be completed by another provider, this initial triage assessment does not replace that evaluation, and the importance of remaining in the ED until their evaluation is complete.     Lunette Stands, [place] 03/19/23 1212

## 2023-03-20 ENCOUNTER — Telehealth: Payer: Self-pay

## 2023-03-21 NOTE — Transitions of Care (Post Inpatient/ED Visit) (Signed)
   03/21/2023  Name: Theresa Peterson MRN: [id number] DOB: 24-Mar-1965  Today's TOC FU Call Status: Today's TOC FU Call Status:: Successful TOC FU Call Completed TOC FU Call Complete Date: 03/21/23 Patient's Name and Date of Birth confirmed.  Transition Care Management Follow-up Telephone Call Date of Discharge: 03/21/23 Discharge Facility: [facility]) Type of Discharge: Emergency Department How have you been since you were released from the hospital?: Better Any questions or concerns?: Yes  Items Reviewed: Did you receive and understand the discharge instructions provided?: Yes Any new allergies since your discharge?: No Dietary orders reviewed?: No Do you have support at home?: Yes  Medications Reviewed Today: Medications Reviewed Today     Reviewed by Larey Dresser, RN (Registered Nurse) on 03/21/23 at [phone]  Med List Status: <None>   Medication Order Taking? Sig Documenting Provider Last Dose Status Informant  acetaminophen (TYLENOL) 500 MG tablet [id number] Yes  [provider] Taking Active   albuterol (VENTOLIN HFA) 108 (90 Base) MCG/ACT inhaler [id number] Yes INHALE 2 PUFFS BY MOUTH FOUR TIMES A DAY AS NEEDED FOR SHORTNESS OF BREATH [provider] Taking Active   camphor-menthol Wynelle Fanny) lotion [id number] Yes APPLY SMALL AMOUNT TO AFFECTED AREA TWICE A DAY TO TOP OF LEFT FOOT REGION [provider] Taking Active   CANNABIDIOL PO [id number] Yes Take 500 mg by mouth every other day. [provider] Taking Active Self           Med Note Marella Bile   Sun Feb 02, 2017  3:17 PM) Edibles to help pt sleep  Cholecalciferol 50 MCG (2000 UT) TABS [id number] Yes TAKE ONE TABLET BY MOUTH DAILY FOR BONE HEALTH [provider] Taking Active   Multiple Vitamin (MULTIVITAMIN WITH MINERALS) TABS tablet [id number] Yes Take 1 tablet by mouth daily. [provider] Taking Active Self  omeprazole (PRILOSEC) 40 MG capsule  [id number] Yes Take 40 mg by mouth daily. [provider] Taking Active Self  vitamin B-12 (CYANOCOBALAMIN) 100 MCG tablet [id number] Yes Take 100 mcg by mouth daily. [provider] Taking Active             Home Care and Equipment/Supplies: Were Home Health Services Ordered?: NA Any new equipment or medical supplies ordered?: NA  Functional Questionnaire: Do you need assistance with bathing/showering or dressing?: No Do you need assistance with meal preparation?: No Do you need assistance with eating?: No Do you have difficulty maintaining continence: No Do you need assistance with getting out of bed/getting out of a chair/moving?: No Do you have difficulty managing or taking your medications?: No  Follow up appointments reviewed: PCP Follow-up appointment confirmed?: Yes Date of PCP follow-up appointment?: 03/27/23 Follow-up Provider: [facility] Follow-up appointment confirmed?: NA Do you understand care options if your condition(s) worsen?: Yes-patient verbalized understanding    SIGNATURE Arvil Persons, BSN, RN

## 2023-03-27 ENCOUNTER — Inpatient Hospital Stay: Payer: No Typology Code available for payment source | Admitting: Family Medicine

## 2023-10-02 ENCOUNTER — Encounter

## 2023-10-02 DIAGNOSIS — Z1231 Encounter for screening mammogram for malignant neoplasm of breast: Secondary | ICD-10-CM

## 2024-01-07 LAB — COLOGUARD: COLOGUARD: POSITIVE — AB

## 2024-01-08 LAB — COLOGUARD: Cologuard: POSITIVE — AB

## 2024-01-12 ENCOUNTER — Telehealth: Payer: Self-pay

## 2024-01-12 ENCOUNTER — Other Ambulatory Visit: Payer: Self-pay | Admitting: Nurse Practitioner

## 2024-01-12 DIAGNOSIS — R195 Other fecal abnormalities: Secondary | ICD-10-CM

## 2024-01-12 NOTE — Telephone Encounter (Signed)
 Left message for patient to return call.      Lauren has place a referral to GI for colonoscopy.

## 2024-01-13 NOTE — Telephone Encounter (Signed)
 Left message for patient to return call.

## 2024-01-13 NOTE — Telephone Encounter (Signed)
 I called and spoke with patient and notified her that a referral was placed for GI for positive Cologuard test. Patient had no questions or concerns.

## 2024-01-13 NOTE — Telephone Encounter (Signed)
 Patient return call. Brittany was unavailable at the time.  I advised patient Brittany would return call.

## 2024-01-27 ENCOUNTER — Encounter: Payer: Self-pay | Admitting: Physician Assistant

## 2024-03-08 ENCOUNTER — Encounter: Payer: Self-pay | Admitting: Physician Assistant

## 2024-03-24 ENCOUNTER — Encounter: Payer: Self-pay | Admitting: Physician Assistant

## 2024-03-30 ENCOUNTER — Encounter: Payer: Self-pay | Admitting: Physician Assistant
